# Patient Record
Sex: Female | Born: 1985 | Race: White | Hispanic: No | Marital: Married | Smoking: Former smoker
Health system: Southern US, Community
[De-identification: ages and names within clinical notes are randomized; demographics above are authoritative.]

## PROBLEM LIST (undated history)

## (undated) DIAGNOSIS — G43909 Migraine, unspecified, not intractable, without status migrainosus: Secondary | ICD-10-CM

## (undated) DIAGNOSIS — N912 Amenorrhea, unspecified: Secondary | ICD-10-CM

## (undated) HISTORY — DX: Migraine, unspecified, not intractable, without status migrainosus: G43.909

## (undated) HISTORY — PX: DILATION AND CURETTAGE OF UTERUS: SHX78

## (undated) HISTORY — DX: Amenorrhea, unspecified: N91.2

---

## 2006-02-19 ENCOUNTER — Other Ambulatory Visit: Payer: Self-pay

## 2006-02-19 ENCOUNTER — Emergency Department: Payer: Self-pay | Admitting: Emergency Medicine

## 2006-04-10 ENCOUNTER — Other Ambulatory Visit: Payer: Self-pay

## 2006-04-10 ENCOUNTER — Emergency Department: Payer: Self-pay | Admitting: Unknown Physician Specialty

## 2006-11-30 ENCOUNTER — Emergency Department: Payer: Self-pay | Admitting: Emergency Medicine

## 2007-04-01 ENCOUNTER — Emergency Department: Payer: Self-pay | Admitting: Emergency Medicine

## 2007-04-07 ENCOUNTER — Emergency Department: Payer: Self-pay | Admitting: Emergency Medicine

## 2007-06-05 ENCOUNTER — Emergency Department: Payer: Self-pay | Admitting: Internal Medicine

## 2007-08-17 ENCOUNTER — Encounter: Payer: Self-pay | Admitting: Family Medicine

## 2007-08-23 ENCOUNTER — Encounter: Payer: Self-pay | Admitting: Family Medicine

## 2007-11-23 ENCOUNTER — Observation Stay: Payer: Self-pay | Admitting: Obstetrics and Gynecology

## 2007-12-08 ENCOUNTER — Observation Stay: Payer: Self-pay | Admitting: Obstetrics & Gynecology

## 2007-12-09 ENCOUNTER — Inpatient Hospital Stay: Payer: Self-pay

## 2010-02-15 ENCOUNTER — Inpatient Hospital Stay: Payer: Self-pay | Admitting: Unknown Physician Specialty

## 2011-11-02 ENCOUNTER — Emergency Department: Payer: Self-pay

## 2014-08-01 ENCOUNTER — Observation Stay: Payer: Self-pay | Admitting: Obstetrics and Gynecology

## 2014-08-01 LAB — URINALYSIS, COMPLETE
BLOOD: NEGATIVE
Bilirubin,UR: NEGATIVE
GLUCOSE, UR: NEGATIVE mg/dL (ref 0–75)
Ketone: NEGATIVE
Nitrite: NEGATIVE
Ph: 6 (ref 4.5–8.0)
Protein: 30
RBC,UR: 3 /HPF (ref 0–5)
Specific Gravity: 1.017 (ref 1.003–1.030)
WBC UR: 7 /HPF (ref 0–5)

## 2014-08-01 LAB — PIH PROFILE
Anion Gap: 8 (ref 7–16)
BUN: 5 mg/dL — ABNORMAL LOW (ref 7–18)
CALCIUM: 8.5 mg/dL (ref 8.5–10.1)
CO2: 24 mmol/L (ref 21–32)
CREATININE: 0.54 mg/dL — AB (ref 0.60–1.30)
Chloride: 107 mmol/L (ref 98–107)
EGFR (African American): >60
EGFR (Non-African Amer.): >60
Glucose: 75 mg/dL (ref 65–99)
HCT: 33 % — ABNORMAL LOW (ref 35.0–47.0)
HGB: 10.5 g/dL — ABNORMAL LOW (ref 12.0–16.0)
MCH: 27.7 pg (ref 26.0–34.0)
MCHC: 31.8 g/dL — ABNORMAL LOW (ref 32.0–36.0)
MCV: 87 fL (ref 80–100)
Osmolality: 273 (ref 275–301)
PLATELETS: 193 10*3/uL (ref 150–440)
Potassium: 3.9 mmol/L (ref 3.5–5.1)
RBC: 3.8 10*6/uL (ref 3.80–5.20)
RDW: 15.2 % — ABNORMAL HIGH (ref 11.5–14.5)
SGOT(AST): 22 U/L (ref 15–37)
SODIUM: 139 mmol/L (ref 136–145)
Uric Acid: 2.8 mg/dL (ref 2.6–6.0)
WBC: 11.7 10*3/uL — ABNORMAL HIGH (ref 3.6–11.0)

## 2014-08-01 LAB — PROTEIN / CREATININE RATIO, URINE
Creatinine, Urine: 193 mg/dL — ABNORMAL HIGH (ref 30.0–125.0)
PROTEIN, RANDOM URINE: 32 mg/dL — AB (ref 0–12)
Protein/Creat. Ratio: 166 mg/gCREAT (ref 0–200)

## 2014-08-20 ENCOUNTER — Inpatient Hospital Stay: Payer: Self-pay | Admitting: Obstetrics and Gynecology

## 2014-08-20 LAB — CBC WITH DIFFERENTIAL/PLATELET
Basophil #: 0 10*3/uL (ref 0.0–0.1)
Basophil %: 0.4 %
EOS ABS: 0.1 10*3/uL (ref 0.0–0.7)
EOS PCT: 0.9 %
HCT: 36.9 % (ref 35.0–47.0)
HGB: 12.1 g/dL (ref 12.0–16.0)
LYMPHS ABS: 2.8 10*3/uL (ref 1.0–3.6)
Lymphocyte %: 30.6 %
MCH: 28.8 pg (ref 26.0–34.0)
MCHC: 32.8 g/dL (ref 32.0–36.0)
MCV: 88 fL (ref 80–100)
MONO ABS: 0.5 x10 3/mm (ref 0.2–0.9)
Monocyte %: 6 %
NEUTROS ABS: 5.6 10*3/uL (ref 1.4–6.5)
Neutrophil %: 62.1 %
PLATELETS: 211 10*3/uL (ref 150–440)
RBC: 4.2 10*6/uL (ref 3.80–5.20)
RDW: 18 % — ABNORMAL HIGH (ref 11.5–14.5)
WBC: 9 10*3/uL (ref 3.6–11.0)

## 2014-08-22 LAB — HEMATOCRIT: HCT: 30.7 % — AB (ref 35.0–47.0)

## 2014-09-19 ENCOUNTER — Ambulatory Visit: Payer: Self-pay | Admitting: Obstetrics and Gynecology

## 2014-11-26 ENCOUNTER — Ambulatory Visit: Payer: Self-pay | Admitting: Podiatry

## 2014-12-15 LAB — SURGICAL PATHOLOGY

## 2014-12-21 NOTE — Op Note (Signed)
PATIENT NAME:  Madison Randall, Madison Randall MR#:  [id number] DATE OF BIRTH:  [birth year][name]/1987  DATE OF PROCEDURE:  09/19/2014  PREOPERATIVE DIAGNOSES:  1. Heavy vaginal bleeding, refractory to outpatient management.  2. One month postpartum.   POSTOPERATIVE DIAGNOSES:  1. Heavy vaginal bleeding, refractory to outpatient management.  2. One month postpartum.   PROCEDURES:  1. Dilation curettage.  2. Hysteroscopy.   ANESTHESIA: General.   SURGEON: Conard NovakStephen D. Brenten Janney, MD   ESTIMATED BLOOD LOSS: 150 mL   OPERATIVE FLUIDS: 700 mL crystalloid.   COMPLICATIONS: None.   FINDINGS:  Several areas both anteriorly and posteriorly with tissue fragments of indeterminate type.   SPECIMENS: Endometrial curettings.   CONDITION AT END OF PROCEDURE: Stable.   PROCEDURE IN DETAIL: The patient was sent to the operating room, where general anesthesia was administered and found to be adequate. The patient was placed in dorsal supine high lithotomy position in candy cane stirrups and prepped and draped in the usual sterile fashion. After a timeout was called, an in-and-out catheterization was performed for return of approximately 100 mL of clear urine. A sterile speculum was placed in the vagina and a single-tooth tenaculum the was affixed to the anterior lip of the cervix. The cervix was sounded to a depth of approximately 9 cm and it was noted that the uterus was in a retroverted position. The cervix was dilated gently in a serial fashion using Hegar dilators to 8 mm. The hysteroscope was passed gently through the cervix into the uterine cavity with the above-noted findings. The hysteroscope was removed and a gentle curettage was performed until a gritty texture was noted throughout the entire uterus. The hysteroscope was reintroduced into the uterus with no noted defects in the uterus and no heavy bleeding. The hysteroscope was removed and the cervix was watched for approximately 10 minutes to ensure that no heavy  bleeding would occur, and there was no bleeding from the cervix at the end of the procedure. The single-tooth tenaculum was removed and silver nitrate was applied to the entry sites for hemostasis. All instrumentation was verified to be free of the vagina at this point.   The patient tolerated the procedure well. Sponge, lap, and needle counts were correct x2. For VTE prophylaxis, the patient was wearing SCDs. For antibiotic prophylaxis, the patient received Ancef 1 gram IV, within 1 hour of the beginning of the case. The patient was awakened in the operating room and taken to the recovery area in stable condition.    ____________________________ Conard NovakStephen D. Tamma Brigandi, MD sdj:ap D: 09/19/2014 11:42:56 ET Randall: 09/19/2014 14:31:53 ET JOB#: [id number]  cc: Conard NovakStephen D. Brigett Estell, MD, <Dictator> Conard NovakSTEPHEN D Cacie Gaskins MD ELECTRONICALLY SIGNED 09/24/2014 0:31

## 2014-12-30 NOTE — H&P (Signed)
L&D Evaluation:  History:  HPI 29 yo G3P2002 at 5018w2d by D=8wk US derived EDC of 08/13/2014 presenting for elevation of elevated diastolic BP.  Initial BP 136/70, 1 week prior 136/70.  No headaches but repeat BP taken and 134/100 amd 136/90.  Weigh also stable from last week. +FM, no LOF, no VB, no ctx.  Denies HA, vision changes, RUQ or epigastric pain, increased edema.  [facility] for centering pregnancy participation and otherwise uncomplicated.   Presents with elevated diastolic BP reading in clinic   Patient's Medical History depression, migraines   Exam:  Vital Signs 98-135/59-80   Urine Protein trace   General no apparent distress   Mental Status clear   Chest clear   Abdomen gravid, non-tender   Estimated Fetal Weight Average for gestational age   Edema no edema   FHT normal rate with no decels, category I 135, moderate, + accels, nodecels   Ucx absent   Impression:  Impression 29 yo G3P2002 at 6718w2d with elevated diastolic BP reading noted in clinic normotensive here on L&D   Plan:  Comments 1) R/O preeclampsia/GHTN - normotensive, normal labs.  Routine precautions  2) Fetus - category I tracing - 20lbs weight gain this pregnancy  3) A pos / RI / VZI / HBsAg neg / HIV neg / RPR NR / 1-hr 96 / GBS negative  4) Influenza vaccination up to date (06/13/14) declined TDAP 2/2 prior allergic reactions  5) Breast / minipill  6) Disposition - d/c home follow up in place 08/06/14   Electronic Signatures: Lorrene ReidStaebler, Michele Judy M (MD)  (Signed 11-Dec-15 12:27)  Authored: L&D Evaluation   Last Updated: 11-Dec-15 12:27 by Lorrene ReidStaebler, Jaala Bohle M (MD)

## 2015-03-02 ENCOUNTER — Emergency Department: Payer: Self-pay

## 2015-03-02 ENCOUNTER — Emergency Department
Admission: EM | Admit: 2015-03-02 | Discharge: 2015-03-02 | Disposition: A | Discharge status: Home / Self Care | Payer: Self-pay | Attending: Emergency Medicine | Admitting: Emergency Medicine

## 2015-03-02 ENCOUNTER — Encounter: Payer: Self-pay | Admitting: Emergency Medicine

## 2015-03-02 DIAGNOSIS — Z79899 Other long term (current) drug therapy: Secondary | ICD-10-CM | POA: Insufficient documentation

## 2015-03-02 DIAGNOSIS — M542 Cervicalgia: Secondary | ICD-10-CM

## 2015-03-02 DIAGNOSIS — W51XXXA Accidental striking against or bumped into by another person, initial encounter: Secondary | ICD-10-CM | POA: Insufficient documentation

## 2015-03-02 DIAGNOSIS — Y998 Other external cause status: Secondary | ICD-10-CM | POA: Insufficient documentation

## 2015-03-02 DIAGNOSIS — Y9389 Activity, other specified: Secondary | ICD-10-CM | POA: Insufficient documentation

## 2015-03-02 DIAGNOSIS — S199XXA Unspecified injury of neck, initial encounter: Secondary | ICD-10-CM | POA: Insufficient documentation

## 2015-03-02 DIAGNOSIS — S63601A Unspecified sprain of right thumb, initial encounter: Secondary | ICD-10-CM | POA: Insufficient documentation

## 2015-03-02 DIAGNOSIS — Y9289 Other specified places as the place of occurrence of the external cause: Secondary | ICD-10-CM | POA: Insufficient documentation

## 2015-03-02 MED ORDER — CYCLOBENZAPRINE HCL 10 MG PO TABS: 10.0000 mg | ORAL_TABLET | Freq: Three times a day (TID) | ORAL | Status: AC | PRN

## 2015-03-02 MED ORDER — CYCLOBENZAPRINE HCL 10 MG PO TABS
5.0000 mg | ORAL_TABLET | Freq: Once | ORAL | Status: AC
Start: 2015-03-02 — End: 2015-03-02
  Administered 2015-03-02: 5 mg via ORAL

## 2015-03-02 MED ORDER — IBUPROFEN 800 MG PO TABS
ORAL_TABLET | ORAL | Status: AC
  Administered 2015-03-02: 800 mg via ORAL
  Filled 2015-03-02: qty 1

## 2015-03-02 MED ORDER — IBUPROFEN 800 MG PO TABS
800.0000 mg | ORAL_TABLET | Freq: Once | ORAL | Status: AC
  Administered 2015-03-02: 800 mg via ORAL

## 2015-03-02 MED ORDER — CYCLOBENZAPRINE HCL 10 MG PO TABS
ORAL_TABLET | ORAL | Status: AC
  Filled 2015-03-02: qty 1

## 2015-03-02 NOTE — ED Notes (Addendum)
Pt says she was  Playing with her kids when she injured her neck and right thumb; unable to turn head side to side due to  Pain in the side of her neck; swelling to base of right thumb-pain increased with movement; pt tried icy hot at home with no relief; pt with point tenderness to right side of her neck;

## 2015-03-02 NOTE — Discharge Instructions (Signed)
Finger Sprain A finger sprain is a tear in one of the strong, fibrous tissues that connect the bones (ligaments) in your finger. The severity of the sprain depends on how much of the ligament is torn. The tear can be either partial or complete. CAUSES  Often, sprains are a result of a fall or accident. If you extend your hands to catch an object or to protect yourself, the force of the impact causes the fibers of your ligament to stretch too much. This excess tension causes the fibers of your ligament to tear. SYMPTOMS  You may have some loss of motion in your finger. Other symptoms include:  Bruising.  Tenderness.  Swelling. DIAGNOSIS  In order to diagnose finger sprain, your caregiver will physically examine your finger or thumb to determine how torn the ligament is. Your caregiver may also suggest an X-ray exam of your finger to make sure no bones are broken. TREATMENT  If your ligament is only partially torn, treatment usually involves keeping the finger in a fixed position (immobilization) for a short period. To do this, your caregiver will apply a bandage, cast, or splint to keep your finger from moving until it heals. For a partially torn ligament, the healing process usually takes 2 to 3 weeks. If your ligament is completely torn, you may need surgery to reconnect the ligament to the bone. After surgery a cast or splint will be applied and will need to stay on your finger or thumb for 4 to 6 weeks while your ligament heals. HOME CARE INSTRUCTIONS  Keep your injured finger elevated, when possible, to decrease swelling.  To ease pain and swelling, apply ice to your joint twice a day, for 2 to 3 days:  Put ice in a plastic bag.  Place a towel between your skin and the bag.  Leave the ice on for 15 minutes.  Only take over-the-counter or prescription medicine for pain as directed by your caregiver.  Do not wear rings on your injured finger.  Do not leave your finger unprotected  until pain and stiffness go away (usually 3 to 4 weeks).  Do not allow your cast or splint to get wet. Cover your cast or splint with a plastic bag when you shower or bathe. Do not swim.  Your caregiver may suggest special exercises for you to do during your recovery to prevent or limit permanent stiffness. SEEK IMMEDIATE MEDICAL CARE IF:  Your cast or splint becomes damaged.  Your pain becomes worse rather than better. MAKE SURE YOU:  Understand these instructions.  Will watch your condition.  Will get help right away if you are not doing well or get worse. Document Released: 09/15/2004 Document Revised: 10/31/2011 Document Reviewed: 04/11/2011 [facility] Patient Information 2015 [place], [place]. This information is not intended to replace advice given to you by your health care provider. Make sure you discuss any questions you have with your health care provider.  Muscle Strain A muscle strain is an injury that occurs when a muscle is stretched beyond its normal length. Usually a small number of muscle fibers are torn when this happens. Muscle strain is rated in degrees. First-degree strains have the least amount of muscle fiber tearing and pain. Second-degree and third-degree strains have increasingly more tearing and pain.  Usually, recovery from muscle strain takes 1-2 weeks. Complete healing takes 5-6 weeks.  CAUSES  Muscle strain happens when a sudden, violent force placed on a muscle stretches it too far. This may occur with lifting,  sports, or a fall.  RISK FACTORS Muscle strain is especially common in athletes.  SIGNS AND SYMPTOMS At the site of the muscle strain, there may be:  Pain.  Bruising.  Swelling.  Difficulty using the muscle due to pain or lack of normal function. DIAGNOSIS  Your health care provider will perform a physical exam and ask about your medical history. TREATMENT  Often, the best treatment for a muscle strain is resting, icing, and applying cold  compresses to the injured area.  HOME CARE INSTRUCTIONS   Use the PRICE method of treatment to promote muscle healing during the first 2-3 days after your injury. The PRICE method involves:  Protecting the muscle from being injured again.  Restricting your activity and resting the injured body part.  Icing your injury. To do this, put ice in a plastic bag. Place a towel between your skin and the bag. Then, apply the ice and leave it on from 15-20 minutes each hour. After the third day, switch to moist heat packs.  Apply compression to the injured area with a splint or elastic bandage. Be careful not to wrap it too tightly. This may interfere with blood circulation or increase swelling.  Elevate the injured body part above the level of your heart as often as you can.  Only take over-the-counter or prescription medicines for pain, discomfort, or fever as directed by your health care provider.  Warming up prior to exercise helps to prevent future muscle strains. SEEK MEDICAL CARE IF:   You have increasing pain or swelling in the injured area.  You have numbness, tingling, or a significant loss of strength in the injured area. MAKE SURE YOU:   Understand these instructions.  Will watch your condition.  Will get help right away if you are not doing well or get worse. Document Released: 08/08/2005 Document Revised: 05/29/2013 Document Reviewed: 03/07/2013 [facility] Patient Information 2015 [place], [place]. This information is not intended to replace advice given to you by your health care provider. Make sure you discuss any questions you have with your health care provider.  Cervical Sprain A cervical sprain is an injury in the neck in which the strong, fibrous tissues (ligaments) that connect your neck bones stretch or tear. Cervical sprains can range from mild to severe. Severe cervical sprains can cause the neck vertebrae to be unstable. This can lead to damage of the spinal cord and  can result in serious nervous system problems. The amount of time it takes for a cervical sprain to get better depends on the cause and extent of the injury. Most cervical sprains heal in 1 to 3 weeks. CAUSES  Severe cervical sprains may be caused by:   Contact sport injuries (such as from football, rugby, wrestling, hockey, auto racing, gymnastics, diving, martial arts, or boxing).   Motor vehicle collisions.   Whiplash injuries. This is an injury from a sudden forward and backward whipping movement of the head and neck.  Falls.  Mild cervical sprains may be caused by:   Being in an awkward position, such as while cradling a telephone between your ear and shoulder.   Sitting in a chair that does not offer proper support.   Working at a poorly Marketing executive station.   Looking up or down for long periods of time.  SYMPTOMS   Pain, soreness, stiffness, or a burning sensation in the front, back, or sides of the neck. This discomfort may develop immediately after the injury or slowly, 24 hours or  more after the injury.   Pain or tenderness directly in the middle of the back of the neck.   Shoulder or upper back pain.   Limited ability to move the neck.   Headache.   Dizziness.   Weakness, numbness, or tingling in the hands or arms.   Muscle spasms.   Difficulty swallowing or chewing.   Tenderness and swelling of the neck.  DIAGNOSIS  Most of the time your health care provider can diagnose a cervical sprain by taking your history and doing a physical exam. Your health care provider will ask about previous neck injuries and any known neck problems, such as arthritis in the neck. X-rays may be taken to find out if there are any other problems, such as with the bones of the neck. Other tests, such as a CT scan or MRI, may also be needed.  TREATMENT  Treatment depends on the severity of the cervical sprain. Mild sprains can be treated with rest, keeping the  neck in place (immobilization), and pain medicines. Severe cervical sprains are immediately immobilized. Further treatment is done to help with pain, muscle spasms, and other symptoms and may include:  Medicines, such as pain relievers, numbing medicines, or muscle relaxants.   Physical therapy. This may involve stretching exercises, strengthening exercises, and posture training. Exercises and improved posture can help stabilize the neck, strengthen muscles, and help stop symptoms from returning.  HOME CARE INSTRUCTIONS   Put ice on the injured area.   Put ice in a plastic bag.   Place a towel between your skin and the bag.   Leave the ice on for 15-20 minutes, 3-4 times a day.   If your injury was severe, you may have been given a cervical collar to wear. A cervical collar is a two-piece collar designed to keep your neck from moving while it heals.  Do not remove the collar unless instructed by your health care provider.  If you have long hair, keep it outside of the collar.  Ask your health care provider before making any adjustments to your collar. Minor adjustments may be required over time to improve comfort and reduce pressure on your chin or on the back of your head.  Ifyou are allowed to remove the collar for cleaning or bathing, follow your health care provider's instructions on how to do so safely.  Keep your collar clean by wiping it with mild soap and water and drying it completely. If the collar you have been given includes removable pads, remove them every 1-2 days and hand wash them with soap and water. Allow them to air dry. They should be completely dry before you wear them in the collar.  If you are allowed to remove the collar for cleaning and bathing, wash and dry the skin of your neck. Check your skin for irritation or sores. If you see any, tell your health care provider.  Do not drive while wearing the collar.   Only take over-the-counter or prescription  medicines for pain, discomfort, or fever as directed by your health care provider.   Keep all follow-up appointments as directed by your health care provider.   Keep all physical therapy appointments as directed by your health care provider.   Make any needed adjustments to your workstation to promote good posture.   Avoid positions and activities that make your symptoms worse.   Warm up and stretch before being active to help prevent problems.  SEEK MEDICAL CARE IF:   Your  pain is not controlled with medicine.   You are unable to decrease your pain medicine over time as planned.   Your activity level is not improving as expected.  SEEK IMMEDIATE MEDICAL CARE IF:   You develop any bleeding.  You develop stomach upset.  You have signs of an allergic reaction to your medicine.   Your symptoms get worse.   You develop new, unexplained symptoms.   You have numbness, tingling, weakness, or paralysis in any part of your body.  MAKE SURE YOU:   Understand these instructions.  Will watch your condition.  Will get help right away if you are not doing well or get worse. Document Released: 06/05/2007 Document Revised: 08/13/2013 Document Reviewed: 02/13/2013 [facility] Patient Information 2015 [place], [place]. This information is not intended to replace advice given to you by your health care provider. Make sure you discuss any questions you have with your health care provider.

## 2015-03-02 NOTE — ED Provider Notes (Signed)
[facility] Emergency Department Provider Note  ____________________________________________  Time seen: Approximately 6:19 AM  I have reviewed the triage vital signs and the nursing notes.   HISTORY  Chief Complaint Neck Pain and Finger Injury   HPI Madison Randall is a 29 y.o. female reports that she was playing with her children around 2100. She reports that she was carrying her 6-year-old on her feet when her 81-year-old jumped on and they both fell onto her. The patient reports that she jerked her neck up suddenly and they smashed into her thumb on the right hand. The patient reports that she thought the pain would improve during the night but when she was nursing her baby she felt as though she could not move her neck. She also reports that she is unable to put any pressure on her thumb. The patient reports that she rubbed with some icy hot but did not take any medication. The patient reports that her pain as a 6-7 out of 10 in intensity. She reports that when she initially came in she was unable to move her neck but it has loosened up some.   History reviewed. No pertinent past medical history.  There are no active problems to display for this patient.   Past Surgical History  Procedure Laterality Date  . Dilation and curettage of uterus      Current Outpatient Rx  Name  Route  Sig  Dispense  Refill  . Prenatal Vit-Fe Fumarate-FA (PRENATAL MULTIVITAMIN) TABS tablet   Oral   Take 1 tablet by mouth daily at 12 noon.         . cyclobenzaprine (FLEXERIL) 10 MG tablet   Oral   Take 1 tablet (10 mg total) by mouth every 8 (eight) hours as needed for muscle spasms.   12 tablet   0     Allergies Tdap; Imitrex; and Topamax  History reviewed. No pertinent family history.  Social History History  Substance Use Topics  . Smoking status: Never Smoker   . Smokeless tobacco: Never Used  . Alcohol Use: No    Review of Systems Constitutional:  No fever/chills Eyes: No visual changes. ENT: No sore throat. Cardiovascular: Denies chest pain. Respiratory: Denies shortness of breath. Gastrointestinal: No abdominal pain.  No nausea, no vomiting.  No diarrhea.  No constipation. Genitourinary: Negative for dysuria. Musculoskeletal: Neck pain, right thumb pain Skin: Negative for rash. Neurological: Negative for headaches, focal weakness or numbness.  10-point ROS otherwise negative.  ____________________________________________   PHYSICAL EXAM:  VITAL SIGNS: ED Triage Vitals  Enc Vitals Group     BP 03/02/15 0140 142/83 mmHg     Pulse Rate 03/02/15 0140 101     Resp 03/02/15 0140 18     Temp 03/02/15 0140 98.3 F (36.8 C)     Temp Source 03/02/15 0140 Oral     SpO2 03/02/15 0140 97 %     Weight 03/02/15 0140 180 lb (81.647 kg)     Height 03/02/15 0140  (1.6 m)     Head Cir --      Peak Flow --      Pain Score 03/02/15 0142 8     Pain Loc --      Pain Edu? --      Excl. in GC? --     Constitutional: Alert and oriented. Well appearing and in mild distress. Eyes: Conjunctivae are normal. PERRL. EOMI. Head: Atraumatic. Nose: No congestion/rhinnorhea. Mouth/Throat: Mucous membranes are moist.  Oropharynx non-erythematous. Neck:No cervical spine tenderness to palpation, mild tenderness to palpation of the right side of neck and shoulders Cardiovascular: Normal rate, regular rhythm. Grossly normal heart sounds.  Good peripheral circulation. Respiratory: Normal respiratory effort.  No retractions. Lungs CTAB. Gastrointestinal: Soft and nontender. No distention. Positive bowel sounds Genitourinary: Deferred Musculoskeletal: No lower extremity tenderness nor edema. Neurologic:  Normal speech and language. No gross focal neurologic deficits are appreciated.  Skin:  Skin is warm, dry and intact. No rash noted. Psychiatric: Mood and affect are normal.   ____________________________________________   LABS (all labs  ordered are listed, but only abnormal results are displayed)  Labs Reviewed - No data to display ____________________________________________  EKG  None ____________________________________________  RADIOLOGY  Right thumb x-ray: Negative ____________________________________________   PROCEDURES  Procedure(s) performed: None  Critical Care performed: No  ____________________________________________   INITIAL IMPRESSION / ASSESSMENT AND PLAN / ED COURSE  Pertinent labs & imaging results that were available during my care of the patient were reviewed by me and considered in my medical decision making (see chart for details).  This is a 29 year old female who comes in today with some neck pain and right thumb pain after playing with her children and stretching her neck. The patient did receive some ibuprofen in triage and she reports that her neck feels less stiff and painful. The patient reports that her thumb is painful. Her x-ray does not show any acute fracture at this time. I will place the patient in an aluminum foam finger splint and discharge her with some Flexeril for muscle relaxation. I also informed her that she should place some heat on her neck to help the muscles relax. The patient be discharged home to follow-up with the acute care clinic. ____________________________________________   FINAL CLINICAL IMPRESSION(S) / ED DIAGNOSES  Final diagnoses:  Cervicalgia  Thumb sprain, right, initial encounter      Rebecka ApleyAllison P Webster, MD 03/02/15 [phone]

## 2015-12-18 ENCOUNTER — Telehealth: Payer: Self-pay | Admitting: General Practice

## 2015-12-18 NOTE — Telephone Encounter (Signed)
Left msg to call office to schedule new patient visit/msn

## 2016-04-04 ENCOUNTER — Ambulatory Visit (INDEPENDENT_AMBULATORY_CARE_PROVIDER_SITE_OTHER): Payer: Self-pay | Admitting: Obstetrics and Gynecology

## 2016-04-04 VITALS — BP 120/70 | HR 80 | Wt 156.6 lb

## 2016-04-04 DIAGNOSIS — Z369 Encounter for antenatal screening, unspecified: Secondary | ICD-10-CM

## 2016-04-04 DIAGNOSIS — Z3490 Encounter for supervision of normal pregnancy, unspecified, unspecified trimester: Secondary | ICD-10-CM

## 2016-04-04 DIAGNOSIS — G43909 Migraine, unspecified, not intractable, without status migrainosus: Secondary | ICD-10-CM | POA: Insufficient documentation

## 2016-04-04 DIAGNOSIS — O09291 Supervision of pregnancy with other poor reproductive or obstetric history, first trimester: Secondary | ICD-10-CM

## 2016-04-04 DIAGNOSIS — Z36 Encounter for antenatal screening of mother: Secondary | ICD-10-CM

## 2016-04-04 DIAGNOSIS — Z1389 Encounter for screening for other disorder: Secondary | ICD-10-CM

## 2016-04-04 DIAGNOSIS — G43009 Migraine without aura, not intractable, without status migrainosus: Secondary | ICD-10-CM

## 2016-04-04 DIAGNOSIS — Z331 Pregnant state, incidental: Secondary | ICD-10-CM

## 2016-04-04 DIAGNOSIS — Z113 Encounter for screening for infections with a predominantly sexual mode of transmission: Secondary | ICD-10-CM

## 2016-04-04 DIAGNOSIS — T7589XA Other specified effects of external causes, initial encounter: Secondary | ICD-10-CM

## 2016-04-04 NOTE — Patient Instructions (Signed)
Pregnancy and Zika Virus Disease Zika virus disease, or Zika, is an illness that can spread to people from mosquitoes that carry the virus. It may also spread from person to person through infected body fluids. Zika first occurred in [place], but recently it has spread to new areas. The virus occurs in tropical climates. The location of Zika continues to change. Most people who become infected with Zika virus do not develop serious illness. However, Zika may cause birth defects in an unborn baby whose mother is infected with the virus. It may also increase the risk of miscarriage. WHAT ARE THE SYMPTOMS OF ZIKA VIRUS DISEASE? In many cases, people who have been infected with Zika virus do not develop any symptoms. If symptoms appear, they usually start about a week after the person is infected. Symptoms are usually mild. They may include:  Fever.  Rash.  Red eyes.  Joint pain. HOW DOES ZIKA VIRUS DISEASE SPREAD? The main way that Zika virus spreads is through the bite of a certain type of mosquito. Unlike most types of mosquitos, which bite only at night, the type of mosquito that carries Zika virus bites both at night and during the day. Zika virus can also spread through sexual contact, through a blood transfusion, and from a mother to her baby before or during birth. Once you have had Zika virus disease, it is unlikely that you will get it again. CAN I PASS ZIKA TO MY BABY DURING PREGNANCY? Yes, Zika can pass from a mother to her baby before or during birth. WHAT PROBLEMS CAN ZIKA CAUSE FOR MY BABY? A woman who is infected with Zika virus while pregnant is at risk of having her baby born with a condition in which the brain or head is smaller than expected (microcephaly). Babies who have microcephaly can have developmental delays, seizures, hearing problems, and vision problems. Having Zika virus disease during pregnancy can also increase the risk of miscarriage. HOW CAN ZIKA VIRUS DISEASE BE  PREVENTED? There is no vaccine to prevent Zika. The best way to prevent the disease is to avoid infected mosquitoes and avoid exposure to body fluids that can spread the virus. Avoid any possible exposure to Zika by taking the following precautions. For women and their sex partners:  Avoid traveling to high-risk areas. The locations where Zika is being reported change often. To identify high-risk areas, check the CDC travel website: www.cdc.gov/zika/geo/index.html  If you or your sex partner must travel to a high-risk area, talk with a health care provider before and after traveling.  Take all precautions to avoid mosquito bites if you live in, or travel to, any of the high-risk areas. Insect repellents are safe to use during pregnancy.  Ask your health care provider when it is safe to have sexual contact. For women:  If you are pregnant or trying to become pregnant, avoid sexual contact with persons who may have been exposed to Zika virus, persons who have possible symptoms of Zika, or persons whose history you are unsure about. If you choose to have sexual contact with someone who may have been exposed to Zika virus, use condoms correctly during the entire duration of sexual activity, every time. Do not share sexual devices, as you may be exposed to body fluids.  Ask your health care provider about when it is safe to attempt pregnancy after a possible exposure to Zika virus. WHAT STEPS SHOULD I TAKE TO AVOID MOSQUITO BITES? Take these steps to avoid mosquito bites when you are   in a high-risk area:  Wear loose clothing that covers your arms and legs.  Limit your outdoor activities.  Do not open windows unless they have window screens.  Sleep under mosquito nets.  Use insect repellent. The best insect repellents have:  DEET, picaridin, oil of lemon eucalyptus (OLE), or IR3535 in them.  Higher amounts of an active ingredient in them.  Remember that insect repellents are safe to use  during pregnancy.  Do not use OLE on children who are younger than 3 years of age. Do not use insect repellent on babies who are younger than 2 months of age.  Cover your child's stroller with mosquito netting. Make sure the netting fits snugly and that any loose netting does not cover your child's mouth or nose. Do not use a blanket as a mosquito-protection cover.  Do not apply insect repellent underneath clothing.  If you are using sunscreen, apply the sunscreen before applying the insect repellent.  Treat clothing with permethrin. Do not apply permethrin directly to your skin. Follow label directions for safe use.  Get rid of standing water, where mosquitoes may reproduce. Standing water is often found in items such as buckets, bowls, animal food dishes, and flowerpots. When you return from traveling to any high-risk area, continue taking actions to protect yourself against mosquito bites for 3 weeks, even if you show no signs of illness. This will prevent spreading Zika virus to uninfected mosquitoes. WHAT SHOULD I KNOW ABOUT THE SEXUAL TRANSMISSION OF ZIKA? People can spread Zika to their sexual partners during vaginal, anal, or oral sex, or by sharing sexual devices. Many people with Zika do not develop symptoms, so a person could spread the disease without knowing that they are infected. The greatest risk is to women who are pregnant or who may become pregnant. Zika virus can live longer in semen than it can live in blood. Couples can prevent sexual transmission of the virus by:  Using condoms correctly during the entire duration of sexual activity, every time. This includes vaginal, anal, and oral sex.  Not sharing sexual devices. Sharing increases your risk of being exposed to body fluid from another person.  Avoiding all sexual activity until your health care provider says it is safe. SHOULD I BE TESTED FOR ZIKA VIRUS? A sample of your blood can be tested for Zika virus. A pregnant  woman should be tested if she may have been exposed to the virus or if she has symptoms of Zika. She may also have additional tests done during her pregnancy, such ultrasound testing. Talk with your health care provider about which tests are recommended.   This information is not intended to replace advice given to you by your health care provider. Make sure you discuss any questions you have with your health care provider.   Document Released: 04/29/2015 Document Reviewed: 04/22/2015 Elsevier Interactive Patient Education 2016 [employer] Minor Illnesses and Medications in Pregnancy  Cold/Flu:  Sudafed for congestion- Robitussin (plain) for cough- Tylenol for discomfort.  Please follow the directions on the label.  Try not to take any more than needed.  OTC Saline nasal spray and air humidifier or cool-mist  Vaporizer to sooth nasal irritation and to loosen congestion.  It is also important to increase intake of non carbonated fluids, especially if you have a fever.  Constipation:  Colace-2 capsules at bedtime; Metamucil- follow directions on label; Senokot- 1 tablet at bedtime.  Any one of these medications can be used.  It is also   very important to increase fluids and fruits along with regular exercise.  If problem persists please call the office.  Diarrhea:  Kaopectate as directed on the label.  Eat a bland diet and increase fluids.  Avoid highly seasoned foods.  Headache:  Tylenol 1 or 2 tablets every 3-4 hours as needed  Indigestion:  Maalox, Mylanta, Tums or Rolaids- as directed on label.  Also try to eat small meals and avoid fatty, greasy or spicy foods.  Nausea with or without Vomiting:  Nausea in pregnancy is caused by increased levels of hormones in the body which influence the digestive system and cause irritation when stomach acids accumulate.  Symptoms usually subside after 1st trimester of pregnancy.  Try the following: 1. Keep saltines, graham crackers or dry toast by your bed  to eat upon awakening. 2. Don't let your stomach get empty.  Try to eat 5-6 small meals per day instead of 3 large ones. 3. Avoid greasy fatty or highly seasoned foods.  4. Take OTC Unisom 1 tablet at bed time along with OTC Vitamin B6 25-50 mg 3 times per day.    If nausea continues with vomiting and you are unable to keep down food and fluids you may need a prescription medication.  Please notify your provider.   Sore throat:  Chloraseptic spray, throat lozenges and or plain Tylenol.  Vaginal Yeast Infection:  OTC Monistat for 7 days as directed on label.  If symptoms do not resolve within a week notify provider.  If any of the above problems do not subside with recommended treatment please call the office for further assistance.   Do not take Aspirin, Advil, Motrin or Ibuprofen.  * * OTC= Over the counter Hyperemesis Gravidarum Hyperemesis gravidarum is a severe form of nausea and vomiting that happens during pregnancy. Hyperemesis is worse than morning sickness. It may cause you to have nausea or vomiting all day for many days. It may keep you from eating and drinking enough food and liquids. Hyperemesis usually occurs during the first half (the first 20 weeks) of pregnancy. It often goes away once a woman is in her second half of pregnancy. However, sometimes hyperemesis continues through an entire pregnancy.  CAUSES  The cause of this condition is not completely known but is thought to be related to changes in the body's hormones when pregnant. It could be from the high level of the pregnancy hormone or an increase in estrogen in the body.  SIGNS AND SYMPTOMS   Severe nausea and vomiting.  Nausea that does not go away.  Vomiting that does not allow you to keep any food down.  Weight loss and body fluid loss (dehydration).  Having no desire to eat or not liking food you have previously enjoyed. DIAGNOSIS  Your health care provider will do a physical exam and ask you about your  symptoms. He or she may also order blood tests and urine tests to make sure something else is not causing the problem.  TREATMENT  You may only need medicine to control the problem. If medicines do not control the nausea and vomiting, you will be treated in the hospital to prevent dehydration, increased acid in the blood (acidosis), weight loss, and changes in the electrolytes in your body that may harm the unborn baby (fetus). You may need IV fluids.  HOME CARE INSTRUCTIONS   Only take over-the-counter or prescription medicines as directed by your health care provider.  Try eating a couple of dry crackers or   toast in the morning before getting out of bed.  Avoid foods and smells that upset your stomach.  Avoid fatty and spicy foods.  Eat 5-6 small meals a day.  Do not drink when eating meals. Drink between meals.  For snacks, eat high-protein foods, such as cheese.  Eat or suck on things that have ginger in them. Ginger helps nausea.  Avoid food preparation. The smell of food can spoil your appetite.  Avoid iron pills and iron in your multivitamins until after 3-4 months of being pregnant. However, consult with your health care provider before stopping any prescribed iron pills. SEEK MEDICAL CARE IF:   Your abdominal pain increases.  You have a severe headache.  You have vision problems.  You are losing weight. SEEK IMMEDIATE MEDICAL CARE IF:   You are unable to keep fluids down.  You vomit blood.  You have constant nausea and vomiting.  You have excessive weakness.  You have extreme thirst.  You have dizziness or fainting.  You have a fever or persistent symptoms for more than 2-3 days.  You have a fever and your symptoms suddenly get worse. MAKE SURE YOU:   Understand these instructions.  Will watch your condition.  Will get help right away if you are not doing well or get worse.   This information is not intended to replace advice given to you by your  health care provider. Make sure you discuss any questions you have with your health care provider.   Document Released: 08/08/2005 Document Revised: 05/29/2013 Document Reviewed: 03/20/2013 Elsevier Interactive Patient Education 2016 [employer] Commonly Asked Questions During Pregnancy  Cats: A parasite can be excreted in cat feces.  To avoid exposure you need to have another person empty the little box.  If you must empty the litter box you will need to wear gloves.  Wash your hands after handling your cat.  This parasite can also be found in raw or undercooked meat so this should also be avoided.  Colds, Sore Throats, Flu: Please check your medication sheet to see what you can take for symptoms.  If your symptoms are unrelieved by these medications please call the office.  Dental Work: Most any dental work your dentist recommends is permitted.  X-rays should only be taken during the first trimester if absolutely necessary.  Your abdomen should be shielded with a lead apron during all x-rays.  Please notify your provider prior to receiving any x-rays.  Novocaine is fine; gas is not recommended.  If your dentist requires a note from us prior to dental work please call the office and we will provide one for you.  Exercise: Exercise is an important part of staying healthy during your pregnancy.  You may continue most exercises you were accustomed to prior to pregnancy.  Later in your pregnancy you will most likely notice you have difficulty with activities requiring balance like riding a bicycle.  It is important that you listen to your body and avoid activities that put you at a higher risk of falling.  Adequate rest and staying well hydrated are a must!  If you have questions about the safety of specific activities ask your provider.    Exposure to Children with illness: Try to avoid obvious exposure; report any symptoms to us when noted,  If you have chicken pos, red measles or mumps, you should  be immune to these diseases.   Please do not take any vaccines while pregnant unless you have checked with   your OB provider.  Fetal Movement: After 28 weeks we recommend you do "kick counts" twice daily.  Lie or sit down in a calm quiet environment and count your baby movements "kicks".  You should feel your baby at least 10 times per hour.  If you have not felt 10 kicks within the first hour get up, walk around and have something sweet to eat or drink then repeat for an additional hour.  If count remains less than 10 per hour notify your provider.  Fumigating: Follow your pest control agent's advice as to how long to stay out of your home.  Ventilate the area well before re-entering.  Hemorrhoids:   Most over-the-counter preparations can be used during pregnancy.  Check your medication to see what is safe to use.  It is important to use a stool softener or fiber in your diet and to drink lots of liquids.  If hemorrhoids seem to be getting worse please call the office.   Hot Tubs:  Hot tubs Jacuzzis and saunas are not recommended while pregnant.  These increase your internal body temperature and should be avoided.  Intercourse:  Sexual intercourse is safe during pregnancy as long as you are comfortable, unless otherwise advised by your provider.  Spotting may occur after intercourse; report any bright red bleeding that is heavier than spotting.  Labor:  If you know that you are in labor, please go to the hospital.  If you are unsure, please call the office and let us help you decide what to do.  Lifting, straining, etc:  If your job requires heavy lifting or straining please check with your provider for any limitations.  Generally, you should not lift items heavier than that you can lift simply with your hands and arms (no back muscles)  Painting:  Paint fumes do not harm your pregnancy, but may make you ill and should be avoided if possible.  Latex or water based paints have less odor than oils.   Use adequate ventilation while painting.  Permanents & Hair Color:  Chemicals in hair dyes are not recommended as they cause increase hair dryness which can increase hair loss during pregnancy.  " Highlighting" and permanents are allowed.  Dye may be absorbed differently and permanents may not hold as well during pregnancy.  Sunbathing:  Use a sunscreen, as skin burns easily during pregnancy.  Drink plenty of fluids; avoid over heating.  Tanning Beds:  Because their possible side effects are still unknown, tanning beds are not recommended.  Ultrasound Scans:  Routine ultrasounds are performed at approximately 20 weeks.  You will be able to see your baby's general anatomy an if you would like to know the gender this can usually be determined as well.  If it is questionable when you conceived you may also receive an ultrasound early in your pregnancy for dating purposes.  Otherwise ultrasound exams are not routinely performed unless there is a medical necessity.  Although you can request a scan we ask that you pay for it when conducted because insurance does not cover " patient request" scans.  Work: If your pregnancy proceeds without complications you may work until your due date, unless your physician or employer advises otherwise.  Round Ligament Pain/Pelvic Discomfort:  Sharp, shooting pains not associated with bleeding are fairly common, usually occurring in the second trimester of pregnancy.  They tend to be worse when standing up or when you remain standing for long periods of time.  These are the result   of pressure of certain pelvic ligaments called "round ligaments".  Rest, Tylenol and heat seem to be the most effective relief.  As the womb and fetus grow, they rise out of the pelvis and the discomfort improves.  Please notify the office if your pain seems different than that described.  It may represent a more serious condition.   

## 2016-04-04 NOTE — Progress Notes (Signed)
Madison GoingKendra T Ercole presents for NOB nurse interview visit. G-5.  [id number]. Pregnancy confirmation at [facility] on 03/24/2016. LMP: 01/26/2016. Had SAB in April 2017, first trimester. Ultrasound ordered for viability and dating due to recent miscarriage. Pregnancy education material explained and given. Has cat in the home. Has changed cat litter before she knew she was pregnant. NOB labs ordered.  HIV labs and Drug screen were explained optional and she could opt out of tests but did not decline. Drug screen ordered. PNV encouraged. Pt declines genetic testing. Pt. To follow up with provider in 2 weeks for NOB physical.  All questions answered.

## 2016-04-06 ENCOUNTER — Other Ambulatory Visit: Payer: Self-pay | Admitting: Obstetrics and Gynecology

## 2016-04-06 ENCOUNTER — Ambulatory Visit (INDEPENDENT_AMBULATORY_CARE_PROVIDER_SITE_OTHER): Payer: 59

## 2016-04-06 DIAGNOSIS — O09291 Supervision of pregnancy with other poor reproductive or obstetric history, first trimester: Secondary | ICD-10-CM

## 2016-04-06 DIAGNOSIS — Z36 Encounter for antenatal screening of mother: Secondary | ICD-10-CM | POA: Diagnosis not present

## 2016-04-06 DIAGNOSIS — Z331 Pregnant state, incidental: Secondary | ICD-10-CM

## 2016-04-06 DIAGNOSIS — Z369 Encounter for antenatal screening, unspecified: Secondary | ICD-10-CM

## 2016-04-06 DIAGNOSIS — Z3490 Encounter for supervision of normal pregnancy, unspecified, unspecified trimester: Secondary | ICD-10-CM

## 2016-04-06 LAB — MONITOR DRUG PROFILE 14(MW)
Amphetamine Scrn, Ur: NEGATIVE ng/mL
BARBITURATE SCREEN URINE: NEGATIVE ng/mL
BENZODIAZEPINE SCREEN, URINE: NEGATIVE ng/mL
Buprenorphine, Urine: NEGATIVE ng/mL
CANNABINOIDS UR QL SCN: NEGATIVE ng/mL
CREATININE(CRT), U: 228 mg/dL (ref 20.0–300.0)
Cocaine (Metab) Scrn, Ur: NEGATIVE ng/mL
FENTANYL, URINE: NEGATIVE pg/mL
METHADONE SCREEN, URINE: NEGATIVE ng/mL
Meperidine Screen, Urine: NEGATIVE ng/mL
OPIATE SCREEN URINE: NEGATIVE ng/mL
OXYCODONE+OXYMORPHONE UR QL SCN: NEGATIVE ng/mL
Ph of Urine: 5.8 (ref 4.5–8.9)
Phencyclidine Qn, Ur: NEGATIVE ng/mL
Propoxyphene Scrn, Ur: NEGATIVE ng/mL
SPECIFIC GRAVITY: 1.022
TRAMADOL SCREEN, URINE: NEGATIVE ng/mL

## 2016-04-06 LAB — CBC WITH DIFFERENTIAL/PLATELET
BASOS ABS: 0 10*3/uL (ref 0.0–0.2)
Basos: 0 %
EOS (ABSOLUTE): 0.1 10*3/uL (ref 0.0–0.4)
Eos: 1 %
HEMATOCRIT: 42 % (ref 34.0–46.6)
HEMOGLOBIN: 13.8 g/dL (ref 11.1–15.9)
Immature Grans (Abs): 0 10*3/uL (ref 0.0–0.1)
Immature Granulocytes: 0 %
LYMPHS: 22 %
Lymphocytes Absolute: 1.6 10*3/uL (ref 0.7–3.1)
MCH: 29.6 pg (ref 26.6–33.0)
MCHC: 32.9 g/dL (ref 31.5–35.7)
MCV: 90 fL (ref 79–97)
MONOCYTES: 6 %
Monocytes Absolute: 0.4 10*3/uL (ref 0.1–0.9)
NEUTROS PCT: 71 %
Neutrophils Absolute: 4.9 10*3/uL (ref 1.4–7.0)
Platelets: 273 10*3/uL (ref 150–379)
RBC: 4.67 x10E6/uL (ref 3.77–5.28)
RDW: 13.5 % (ref 12.3–15.4)
WBC: 7 10*3/uL (ref 3.4–10.8)

## 2016-04-06 LAB — MICROSCOPIC EXAMINATION
CASTS: NONE SEEN /LPF
Epithelial Cells (non renal): >10 /hpf — AB (ref 0–10)
RBC, UA: NONE SEEN /hpf (ref 0–?)

## 2016-04-06 LAB — URINALYSIS, ROUTINE W REFLEX MICROSCOPIC
BILIRUBIN UA: NEGATIVE
Glucose, UA: NEGATIVE
Ketones, UA: NEGATIVE
NITRITE UA: NEGATIVE
PH UA: 6 (ref 5.0–7.5)
PROTEIN UA: NEGATIVE
RBC UA: NEGATIVE
Specific Gravity, UA: 1.027 (ref 1.005–1.030)
UUROB: 0.2 mg/dL (ref 0.2–1.0)

## 2016-04-06 LAB — GC/CHLAMYDIA PROBE AMP
CHLAMYDIA, DNA PROBE: NEGATIVE
Neisseria gonorrhoeae by PCR: NEGATIVE

## 2016-04-06 LAB — TOXOPLASMA ANTIBODIES- IGG AND  IGM
Toxoplasma Antibody- IgM: <3 AU/mL (ref 0.0–7.9)
Toxoplasma IgG Ratio: <3 IU/mL (ref 0.0–7.1)

## 2016-04-06 LAB — CULTURE, OB URINE

## 2016-04-06 LAB — NICOTINE SCREEN, URINE: Cotinine Ql Scrn, Ur: NEGATIVE ng/mL

## 2016-04-06 LAB — URINE CULTURE, OB REFLEX: ORGANISM ID, BACTERIA: NO GROWTH

## 2016-04-06 LAB — RPR: RPR: NONREACTIVE

## 2016-04-06 LAB — RUBELLA SCREEN: Rubella Antibodies, IGG: 1.8 index (ref >0.99)

## 2016-04-06 LAB — HEPATITIS B SURFACE ANTIGEN: Hepatitis B Surface Ag: NEGATIVE

## 2016-04-06 LAB — ABO

## 2016-04-06 LAB — VARICELLA ZOSTER ANTIBODY, IGG: Varicella zoster IgG: 632 index (ref >165)

## 2016-04-06 LAB — ANTIBODY SCREEN: ANTIBODY SCREEN: NEGATIVE

## 2016-04-06 LAB — HIV ANTIBODY (ROUTINE TESTING W REFLEX): HIV Screen 4th Generation wRfx: NONREACTIVE

## 2016-04-06 LAB — RH TYPE: Rh Factor: POSITIVE

## 2016-04-11 ENCOUNTER — Encounter: Payer: Self-pay | Admitting: Obstetrics and Gynecology

## 2016-04-27 ENCOUNTER — Ambulatory Visit (INDEPENDENT_AMBULATORY_CARE_PROVIDER_SITE_OTHER): Payer: 59 | Admitting: Obstetrics and Gynecology

## 2016-04-27 VITALS — BP 107/69 | HR 89 | Wt 156.4 lb

## 2016-04-27 DIAGNOSIS — Z8669 Personal history of other diseases of the nervous system and sense organs: Secondary | ICD-10-CM

## 2016-04-27 DIAGNOSIS — Z124 Encounter for screening for malignant neoplasm of cervix: Secondary | ICD-10-CM

## 2016-04-27 DIAGNOSIS — Z3482 Encounter for supervision of other normal pregnancy, second trimester: Secondary | ICD-10-CM

## 2016-04-27 DIAGNOSIS — Z3492 Encounter for supervision of normal pregnancy, unspecified, second trimester: Secondary | ICD-10-CM

## 2016-04-27 DIAGNOSIS — E663 Overweight: Secondary | ICD-10-CM

## 2016-04-27 LAB — POCT URINALYSIS DIPSTICK
Bilirubin, UA: NEGATIVE
Blood, UA: NEGATIVE
Glucose, UA: NEGATIVE
KETONES UA: NEGATIVE
Leukocytes, UA: NEGATIVE
Nitrite, UA: NEGATIVE
PH UA: 6
SPEC GRAV UA: 1.02
Urobilinogen, UA: NEGATIVE

## 2016-04-27 NOTE — Progress Notes (Signed)
OBSTETRIC INITIAL PRENATAL VISIT  Subjective:    Madison Randall is being sVinnie Leveleen today for her first obstetrical visit.  This is not a planned pregnancy. She is a [id number] female at 5971w1d gestation, Estimated Date of Delivery: 11/01/16 with last menstrual period 01/26/2016 (exact date), consistent with 10 week sono. Her obstetrical history is significant for none. Relationship with FOB: significant other, living together. Patient does intend to breast feed. Pregnancy history fully reviewed.   Obstetric History   G5   P3   T3   P0   A1   L3    SAB0   TAB0   Ectopic0   Multiple0   Live Births3     # Outcome Date GA Lbr Len/2nd Weight Sex Delivery Anes PTL Lv  5 Current           4 SAB 12/02/15        ND  3 Term 08/21/14 6144w2d  9 lb 4 oz (4.196 kg) F Vag-Spont  N LIV  2 Term 02/15/10 2920w0d  8 lb 13 oz (3.997 kg) F Vag-Spont  N LIV  1 Term 12/10/07 901w3d  8 lb 4 oz (3.742 kg) M Vag-Spont   LIV      Gynecologic History:  Last pap smear was 2014.  Results were normal.  Denies h/o abnormal pap smears in the past.  Contraception: None Denies history of STIs.    Past Medical History:  Diagnosis Date  . Amenorrhea   . Migraines     Family History  Problem Relation Age of Onset  . Heart disease Father   . Breast cancer Maternal Grandmother   . Thyroid disease Maternal Grandmother   . Heart disease Maternal Grandfather   . Breast cancer Paternal Grandmother   . Breast cancer Other     Past Surgical History:  Procedure Laterality Date  . DILATION AND CURETTAGE OF UTERUS  09/2014 uterine infection form delivery 2015    Social History   Social History  . Marital status: Married    Spouse name: N/A  . Number of children: N/A  . Years of education: N/A   Occupational History  . Not on file.   Social History Main Topics  . Smoking status: Former Games developermoker  . Smokeless tobacco: Never Used  . Alcohol use No  . Drug use: No  . Sexual activity: Yes   Other Topics Concern  .  Not on file   Social History Narrative  . No narrative on file    Current Outpatient Prescriptions on File Prior to Visit  Medication Sig Dispense Refill  . Prenatal Vit-Fe Fumarate-FA (PRENATAL MULTIVITAMIN) TABS tablet Take 1 tablet by mouth daily at 12 noon.     No current facility-administered medications on file prior to visit.      Allergies  Allergen Reactions  . Tdap [Diphth-Acell Pertussis-Tetanus] Anaphylaxis  . Imitrex [Sumatriptan] Other (See Comments)    syncope  . Topamax [Topiramate] Palpitations    Review of Systems General:Not Present- Fever, Weight Loss and Weight Gain. Skin:Not Present- Rash. HEENT:Not Present- Blurred Vision, Headache and Bleeding Gums. Respiratory:Not Present- Difficulty Breathing. Breast:Not Present- Breast Mass. Cardiovascular:Not Present- Chest Pain, Elevated Blood Pressure, Fainting / Blacking Out and Shortness of Breath. Gastrointestinal:Not Present- Abdominal Pain, Constipation, Nausea and Vomiting. Female Genitourinary:Not Present- Frequency, Painful Urination, Pelvic Pain, Vaginal Bleeding, Vaginal Discharge, Contractions, regular, Fetal Movements Decreased, Urinary Complaints and Vaginal Fluid. Musculoskeletal:Not Present- Back Pain and Leg Cramps. Neurological:Present - headaches (migraines ~ 1/weekly). Not Present-  Dizziness. Psychiatric:Not Present- Depression.     Objective:   Blood pressure 107/69, pulse 89, weight 156 lb 6.4 oz (70.9 kg), last menstrual period 01/26/2016.  Body mass index is 27.71 kg/m.  General Appearance:    Alert, cooperative, no distress, appears stated age, overweight  Head:    Normocephalic, without obvious abnormality, atraumatic  Eyes:    PERRL, conjunctiva/corneas clear, EOM's intact, both eyes  Ears:    Normal external ear canals, both ears  Nose:   Nares normal, septum midline, mucosa normal, no drainage or sinus tenderness  Throat:   Lips, mucosa, and tongue normal; teeth and  gums normal  Neck:   Supple, symmetrical, trachea midline, no adenopathy; thyroid: no enlargement/tenderness/nodules; no carotid bruit or JVD  Back:     Symmetric, no curvature, ROM normal, no CVA tenderness  Lungs:     Clear to auscultation bilaterally, respirations unlabored  Chest Wall:    No tenderness or deformity   Heart:    Regular rate and rhythm, S1 and S2 normal, no murmur, rub or gallop  Breast Exam:    No tenderness, masses, or nipple abnormality  Abdomen:     Soft, non-tender, bowel sounds active all four quadrants, no masses, no organomegaly.  FH 13 cm.  FHT 162  bpm.  Genitalia:    Pelvic:external genitalia normal, vagina without lesions, discharge, or tenderness, rectovaginal septum  normal. Cervix normal in appearance, no cervical motion tenderness, no adnexal masses or tenderness.  Pregnancy positive findings: uterine enlargement: 13 wk size, nontender.   Rectal:    Normal external sphincter.  No hemorrhoids appreciated. Internal exam not done.   Extremities:   Extremities normal, atraumatic, no cyanosis or edema  Pulses:   2+ and symmetric all extremities  Skin:   Skin color, texture, turgor normal, no rashes or lesions  Lymph nodes:   Cervical, supraclavicular, and axillary nodes normal  Neurologic:   CNII-XII intact, normal strength, sensation and reflexes throughout     Assessment:   Pregnancy at 13 and 1/7 weeks   Overweight H/o migraines   Plan:   Pap smear performed today.  Initial labs reviewed. Prenatal vitamins encouraged. Problem list reviewed and updated. New OB counseling:  The patient has been given an overview regarding routine prenatal care.  Recommendations regarding diet, weight gain, and exercise in pregnancy were given. Prenatal testing, optional genetic testing, and ultrasound use in pregnancy were reviewed.  AFP3 discussed: ordered. Benefits of Breast Feeding were discussed. The patient is encouraged to consider nursing her baby post partum. H/o  migraines, patient currently with 1 headache per week.  Uses natural remedies as other prescription medications have caused side effects in the past.  Follow up in 4 weeks.  50% of 30 min visit spent on counseling and coordination of care.    Hildred Laser, MD [facility]

## 2016-04-28 DIAGNOSIS — E663 Overweight: Secondary | ICD-10-CM | POA: Insufficient documentation

## 2016-04-28 DIAGNOSIS — Z3480 Encounter for supervision of other normal pregnancy, unspecified trimester: Secondary | ICD-10-CM | POA: Insufficient documentation

## 2016-05-02 LAB — PAP IG AND HPV HIGH-RISK
HPV, HIGH-RISK: NEGATIVE
PAP SMEAR COMMENT: 0

## 2016-05-25 ENCOUNTER — Ambulatory Visit (INDEPENDENT_AMBULATORY_CARE_PROVIDER_SITE_OTHER): Payer: Medicaid Other | Admitting: Obstetrics and Gynecology

## 2016-05-25 ENCOUNTER — Encounter: Payer: Self-pay | Admitting: Obstetrics and Gynecology

## 2016-05-25 VITALS — BP 140/78 | HR 84 | Wt 156.8 lb

## 2016-05-25 DIAGNOSIS — Z3482 Encounter for supervision of other normal pregnancy, second trimester: Secondary | ICD-10-CM

## 2016-05-25 DIAGNOSIS — O2612 Low weight gain in pregnancy, second trimester: Secondary | ICD-10-CM

## 2016-05-25 NOTE — Progress Notes (Signed)
Worried about not gaining weight.

## 2016-05-25 NOTE — Progress Notes (Signed)
ROB: Patient notes concerns about poor weight gain in pregnancy. Notes adequate food consumption, carb loading, drinking milk.  Discussed protein bar supplements, Also will give prescription for Boost. Now declines genetic testing, did not get 1st trimester screen done. RTC in 4 weeks, for anatomy scan. Unable to void today.

## 2016-05-25 NOTE — Progress Notes (Signed)
ROB

## 2016-06-22 ENCOUNTER — Ambulatory Visit (INDEPENDENT_AMBULATORY_CARE_PROVIDER_SITE_OTHER): Payer: Medicaid Other

## 2016-06-22 ENCOUNTER — Ambulatory Visit (INDEPENDENT_AMBULATORY_CARE_PROVIDER_SITE_OTHER): Payer: Medicaid Other | Admitting: Obstetrics and Gynecology

## 2016-06-22 VITALS — BP 122/73 | HR 83 | Wt 163.2 lb

## 2016-06-22 DIAGNOSIS — Z1389 Encounter for screening for other disorder: Secondary | ICD-10-CM

## 2016-06-22 DIAGNOSIS — H61032 Chondritis of left external ear: Secondary | ICD-10-CM | POA: Diagnosis not present

## 2016-06-22 DIAGNOSIS — Z3482 Encounter for supervision of other normal pregnancy, second trimester: Secondary | ICD-10-CM | POA: Diagnosis not present

## 2016-06-22 DIAGNOSIS — Z23 Encounter for immunization: Secondary | ICD-10-CM

## 2016-06-22 DIAGNOSIS — Z369 Encounter for antenatal screening, unspecified: Secondary | ICD-10-CM

## 2016-06-22 LAB — POCT URINALYSIS DIPSTICK
BILIRUBIN UA: NEGATIVE
Blood, UA: NEGATIVE
Glucose, UA: NEGATIVE
Ketones, UA: NEGATIVE
NITRITE UA: NEGATIVE
Protein, UA: NEGATIVE
Spec Grav, UA: 1.01
UROBILINOGEN UA: NEGATIVE
pH, UA: 7.5

## 2016-06-22 NOTE — Patient Instructions (Signed)

## 2016-06-22 NOTE — Progress Notes (Signed)
ROB: Patient with ear pain in cartilage region, achy, throbbing, intermittent.  Has had for years, worsens during pregnancy. Discussed auricular chondritis, take Tylenol, apply pressure.  S/p normal anatomy scan. Flu vaccine given.  RTC in 4 weeks.

## 2016-07-21 ENCOUNTER — Ambulatory Visit (INDEPENDENT_AMBULATORY_CARE_PROVIDER_SITE_OTHER): Payer: Medicaid Other | Admitting: Obstetrics and Gynecology

## 2016-07-21 VITALS — BP 100/62 | HR 72 | Wt 164.8 lb

## 2016-07-21 DIAGNOSIS — Z3482 Encounter for supervision of other normal pregnancy, second trimester: Secondary | ICD-10-CM | POA: Diagnosis not present

## 2016-07-21 DIAGNOSIS — Z131 Encounter for screening for diabetes mellitus: Secondary | ICD-10-CM

## 2016-07-21 DIAGNOSIS — R0989 Other specified symptoms and signs involving the circulatory and respiratory systems: Secondary | ICD-10-CM

## 2016-07-21 LAB — POCT URINALYSIS DIPSTICK
BILIRUBIN UA: NEGATIVE
Blood, UA: NEGATIVE
GLUCOSE UA: NEGATIVE
Ketones, UA: NEGATIVE
NITRITE UA: NEGATIVE
Protein, UA: NEGATIVE
Spec Grav, UA: 1.02
Urobilinogen, UA: NEGATIVE
pH, UA: 7.5

## 2016-07-21 NOTE — Progress Notes (Signed)
ROB: URI symptoms noted x 4 weeks, has just been using cough drops.  Advised on Robitussin, Sudafed, Mucinex, nasal saline spray. RTC in 4 weeks, for 28 week labs at that time.

## 2016-07-21 NOTE — Patient Instructions (Signed)
Minor Illnesses and Medications in Pregnancy  Cold/Flu:  Sudafed for congestion- Robitussin (plain) for cough- Tylenol for discomfort.  Please follow the directions on the label.  Try not to take any more than needed.  OTC Saline nasal spray and air humidifier or cool-mist  Vaporizer to sooth nasal irritation and to loosen congestion.  It is also important to increase intake of non carbonated fluids, especially if you have a fever.  Constipation:  Colace-2 capsules at bedtime; Metamucil- follow directions on label; Senokot- 1 tablet at bedtime.  Any one of these medications can be used.  It is also very important to increase fluids and fruits along with regular exercise.  If problem persists please call the office.  Diarrhea:  Kaopectate as directed on the label.  Eat a bland diet and increase fluids.  Avoid highly seasoned foods.  Headache:  Tylenol 1 or 2 tablets every 3-4 hours as needed  Indigestion:  Maalox, Mylanta, Tums or Rolaids- as directed on label.  Also try to eat small meals and avoid fatty, greasy or spicy foods.  Nausea with or without Vomiting:  Nausea in pregnancy is caused by increased levels of hormones in the body which influence the digestive system and cause irritation when stomach acids accumulate.  Symptoms usually subside after 1st trimester of pregnancy.  Try the following: 1. Keep saltines, graham crackers or dry toast by your bed to eat upon awakening. 2. Don't let your stomach get empty.  Try to eat 5-6 small meals per day instead of 3 large ones. 3. Avoid greasy fatty or highly seasoned foods.  4. Take OTC Unisom 1 tablet at bed time along with OTC Vitamin B6 25-50 mg 3 times per day.    If nausea continues with vomiting and you are unable to keep down food and fluids you may need a prescription medication.  Please notify your provider.   Sore throat:  Chloraseptic spray, throat lozenges and or plain Tylenol.  Vaginal Yeast Infection:  OTC Monistat for 7 days as  directed on label.  If symptoms do not resolve within a week notify provider.  If any of the above problems do not subside with recommended treatment please call the office for further assistance.   Do not take Aspirin, Advil, Motrin or Ibuprofen.  * * OTC= Over the counter  

## 2016-07-27 ENCOUNTER — Encounter: Payer: Self-pay | Admitting: Obstetrics and Gynecology

## 2016-07-28 ENCOUNTER — Telehealth: Payer: Self-pay | Admitting: Obstetrics and Gynecology

## 2016-07-28 NOTE — Telephone Encounter (Signed)
PT WAS MYCHART MESSAGING WITH YOU ABOUT IN EAR INFECTION, SHE WAS TOLD TO COME IN AT 11:30 FOR HER WORK IN SLOT PER YOU, WHEN SHE GOT HERE I DID NOT SEE HER ON THE SCHEDULE, TALKED TO YOU AND SHE WAS A WORK IN AND TOLD PT THAT DR CHERRY HAD TO GO AND DO A DELIVERY BUT WE COULD SEE HER THIS AFTERNOON AT 1:15 WITH DR DE SINCE DR CHERRY SCHEDULE WAS SO BUSY DUE TO DELIVERY, AND SHE WAS GOING THRU THE MYCHART MESSAGE AND SHE SAID WHO IS OKI AND I TOLD HER THAT WAS DR CHERRY'S NURSE, I THEN EXPLAINED TO HER AGAIN THAT DR CHERY HAD TO LEAVE FOR A DELIVERY TAHT WE NEVER KNOW WHEN BABIES COME, SHE LOOKED AT ME AND ROLLED HER EYES AND SIAD I LIVE ON THE OTHER SIDE OF TOWN IM NO COMING BACK UP HERE I'LL JUST LIVE WITH AN EAR INFECTION. SHE THEN TURNED AROUND AND WALKED OUT.  SHE IS AN OB PT...THISIS FOR DOCUMENTATION ON THIS INCIDENT ONLY

## 2016-08-22 NOTE — L&D Delivery Note (Signed)
Delivery Summary for Madison Randall  Labor Events:   Preterm labor:   Rupture date:   Rupture time:   Rupture type: Spontaneous  Fluid Color: Clear  Induction:   Augmentation:   Complications:   Cervical ripening:          Delivery:   Episiotomy:   Lacerations:   Repair suture:   Repair # of packets:   Blood loss (ml): 700   Information for the patient's newborn:  [name], Boy [name] [[id number]][[id number]]    Delivery 10/28/2016 6:33 AM by  Vaginal, Spontaneous Delivery Sex:  female Gestational Age: 3653w3d Delivery Clinician:   Living?:         APGARS  One minute Five minutes Ten minutes  Skin color:        Heart rate:        Grimace:        Muscle tone:        Breathing:        Totals: 8  9      Presentation/position:      Resuscitation:   Cord information:    Disposition of cord blood:     Blood gases sent?  Complications:   Placenta: Delivered:       appearance Newborn Measurements: Weight: 7 lb 13.2 oz (3550 g)  Height: 20.08"  Head circumference:    Chest circumference:    Other providers:    Additional  information: Forceps:   Vacuum:   Breech:   Observed anomalies        Delivery Note At 6:33 AM a viable and healthy female was delivered via Vaginal, Spontaneous Delivery (Presentation: Vertex; LOA position).  APGAR: 8, 9; weight 7 lb 13.2 oz (3550 g).   Placenta status: spontaneously removed, intact.  Cord: 3-vessel, with the following complications: None.  Cord pH: not obtained  Anesthesia:   Episiotomy: None Lacerations: 1st degree; Perineal Suture Repair: 3.0 vicryl Est. Blood Loss (mL): 700. Cytotec placed rectally.   Mom to postpartum.  Baby to Couplet care / Skin to Skin.  [name] 10/29/2016, 12:00 PM

## 2016-08-23 ENCOUNTER — Ambulatory Visit (INDEPENDENT_AMBULATORY_CARE_PROVIDER_SITE_OTHER): Payer: Medicaid Other | Admitting: Obstetrics and Gynecology

## 2016-08-23 ENCOUNTER — Other Ambulatory Visit: Payer: Medicaid Other

## 2016-08-23 VITALS — BP 106/70 | HR 93 | Wt 169.7 lb

## 2016-08-23 DIAGNOSIS — Z3482 Encounter for supervision of other normal pregnancy, second trimester: Secondary | ICD-10-CM

## 2016-08-23 DIAGNOSIS — Z862 Personal history of diseases of the blood and blood-forming organs and certain disorders involving the immune mechanism: Secondary | ICD-10-CM

## 2016-08-23 DIAGNOSIS — Z3009 Encounter for other general counseling and advice on contraception: Secondary | ICD-10-CM

## 2016-08-23 DIAGNOSIS — Z131 Encounter for screening for diabetes mellitus: Secondary | ICD-10-CM

## 2016-08-23 DIAGNOSIS — Z130 Encounter for screening for diseases of the blood and blood-forming organs and certain disorders involving the immune mechanism: Secondary | ICD-10-CM

## 2016-08-23 DIAGNOSIS — J069 Acute upper respiratory infection, unspecified: Secondary | ICD-10-CM

## 2016-08-23 DIAGNOSIS — Z3483 Encounter for supervision of other normal pregnancy, third trimester: Secondary | ICD-10-CM

## 2016-08-23 DIAGNOSIS — Z8759 Personal history of other complications of pregnancy, childbirth and the puerperium: Secondary | ICD-10-CM

## 2016-08-23 LAB — POCT URINALYSIS DIPSTICK
Bilirubin, UA: NEGATIVE
Blood, UA: NEGATIVE
GLUCOSE UA: 100
Ketones, UA: NEGATIVE
Nitrite, UA: NEGATIVE
Protein, UA: NEGATIVE
SPEC GRAV UA: 1.015
Urobilinogen, UA: NEGATIVE
pH, UA: 6.5

## 2016-08-23 MED ORDER — AZITHROMYCIN 250 MG PO TABS: ORAL_TABLET | ORAL | 1 refills | Status: DC

## 2016-08-23 MED ORDER — HYDROCOD POLST-CPM POLST ER 10-8 MG/5ML PO SUER: 5.0000 mL | Freq: Two times a day (BID) | ORAL | 0 refills | Status: DC | PRN

## 2016-08-23 NOTE — Progress Notes (Signed)
ROB: Still noting URI symptoms and persistent cough. Has been using OTC meds as recommended with no relief.  Notes that she had an ear ache several weeks ago, took some left-over Amoxicilin from prior prescription which helped.  Will prescribe Z-pack and Tussionex for symptoms.  For 28 week labs today.  Desires to breastfeed,  desires BTL for contraception (but only if C-section delivery).  Discussed all forms of contraception. Will either maintain abstinence (as she is separated from her husband), or will consider Paraguard IUD if no C-section. Discussed risks vs benefits of BTL.  Medicaid papers signed. Signed blood consent, discussed cord blood banking. Patient reports h/o postpartum hemorrhage in 1st trimester, almost requiring blood transfusion. Allergic to Tdap. RTC in 2 weeks.

## 2016-08-24 LAB — GLUCOSE, 1 HOUR GESTATIONAL: GESTATIONAL DIABETES SCREEN: 131 mg/dL (ref 65–139)

## 2016-08-24 LAB — HEMOGLOBIN AND HEMATOCRIT, BLOOD
Hematocrit: 32.3 % — ABNORMAL LOW (ref 34.0–46.6)
Hemoglobin: 11 g/dL — ABNORMAL LOW (ref 11.1–15.9)

## 2016-09-06 ENCOUNTER — Ambulatory Visit (INDEPENDENT_AMBULATORY_CARE_PROVIDER_SITE_OTHER): Payer: Medicaid Other | Admitting: Certified Nurse Midwife

## 2016-09-06 VITALS — BP 112/68 | HR 84 | Wt 168.2 lb

## 2016-09-06 DIAGNOSIS — Z3493 Encounter for supervision of normal pregnancy, unspecified, third trimester: Secondary | ICD-10-CM

## 2016-09-06 LAB — POCT URINALYSIS DIPSTICK
Bilirubin, UA: NEGATIVE
GLUCOSE UA: NEGATIVE
NITRITE UA: NEGATIVE
PH UA: 5
Protein, UA: NEGATIVE
RBC UA: NEGATIVE
Spec Grav, UA: 1.02
UROBILINOGEN UA: NEGATIVE

## 2016-09-06 MED ORDER — CEFDINIR 300 MG PO CAPS: 300.0000 mg | ORAL_CAPSULE | Freq: Two times a day (BID) | ORAL | 0 refills | Status: AC

## 2016-09-06 NOTE — Progress Notes (Signed)
ROB-Doing well pregnancy wise. Pt reports green, clear, bloody, thin-thick nasal discharge, ear congestion, and cough x 3 week. Minimal relief with Z-pak and home measure.Lungs CTAB, Heart RRR, ears clear. Rx Omnicef. Discussed red flag symptoms and when to call. RTC if symptoms worsen or fail to improve. RTC x 2 weeks for ROB.

## 2016-09-06 NOTE — Progress Notes (Signed)
Pt is c/o being Madison Randall for 3 weeks.

## 2016-09-14 ENCOUNTER — Encounter: Payer: Self-pay | Admitting: Obstetrics and Gynecology

## 2016-09-20 ENCOUNTER — Encounter: Payer: Self-pay | Admitting: Obstetrics and Gynecology

## 2016-09-21 ENCOUNTER — Ambulatory Visit (INDEPENDENT_AMBULATORY_CARE_PROVIDER_SITE_OTHER): Payer: Medicaid Other | Admitting: Obstetrics and Gynecology

## 2016-09-21 ENCOUNTER — Other Ambulatory Visit: Payer: Self-pay | Admitting: Obstetrics and Gynecology

## 2016-09-21 VITALS — BP 108/70 | HR 84 | Wt 172.4 lb

## 2016-09-21 DIAGNOSIS — R200 Anesthesia of skin: Secondary | ICD-10-CM

## 2016-09-21 DIAGNOSIS — R202 Paresthesia of skin: Secondary | ICD-10-CM

## 2016-09-21 DIAGNOSIS — Z3493 Encounter for supervision of normal pregnancy, unspecified, third trimester: Secondary | ICD-10-CM

## 2016-09-21 LAB — POCT URINALYSIS DIPSTICK
Bilirubin, UA: NEGATIVE
Glucose, UA: NEGATIVE
KETONES UA: NEGATIVE
LEUKOCYTES UA: NEGATIVE
NITRITE UA: NEGATIVE
PROTEIN UA: NEGATIVE
RBC UA: NEGATIVE
Spec Grav, UA: 1.025
Urobilinogen, UA: NEGATIVE
pH, UA: 5

## 2016-09-21 NOTE — Progress Notes (Signed)
Pt states that she had a one time episode where she had pain in her abdomen, abdomen became tight and hard. Pt states that she was very nauseated, after that began to have numbness bilaterally in all extremities. Pt was unable to stand or walk, and had no dexterity in her hands. Pt has h/o sciatic nerve pain but has never experienced an episode like this. Pt notes good fetal movement, and denies bleeding or leakage of fluid.

## 2016-09-23 LAB — MAGNESIUM: MAGNESIUM: 1.9 mg/dL (ref 1.6–2.3)

## 2016-09-23 LAB — CALCIUM: Calcium: 8.9 mg/dL (ref 8.7–10.2)

## 2016-09-23 NOTE — Progress Notes (Signed)
ROB: Patient noting tightness in abdomen x 1 episode, followed by numbness in all extremities x 10 minutes and nausea. Has a h/o sciatica. Discussed checking electrolytes, but is likely due to a pinched nerve/nerve compression due to fetal positioning. Discussed accupuncture, yoga, or massage. RTC in 2 weeks. For 36 week labs at that time.

## 2016-10-04 ENCOUNTER — Ambulatory Visit (INDEPENDENT_AMBULATORY_CARE_PROVIDER_SITE_OTHER): Payer: Medicaid Other | Admitting: Obstetrics and Gynecology

## 2016-10-04 ENCOUNTER — Encounter: Payer: Self-pay | Admitting: Obstetrics and Gynecology

## 2016-10-04 VITALS — BP 123/78 | HR 106 | Wt 173.1 lb

## 2016-10-04 DIAGNOSIS — O26893 Other specified pregnancy related conditions, third trimester: Secondary | ICD-10-CM

## 2016-10-04 DIAGNOSIS — N898 Other specified noninflammatory disorders of vagina: Secondary | ICD-10-CM

## 2016-10-04 DIAGNOSIS — N949 Unspecified condition associated with female genital organs and menstrual cycle: Secondary | ICD-10-CM

## 2016-10-04 DIAGNOSIS — Z3483 Encounter for supervision of other normal pregnancy, third trimester: Secondary | ICD-10-CM

## 2016-10-04 LAB — POCT URINALYSIS DIPSTICK
BILIRUBIN UA: NEGATIVE
Blood, UA: NEGATIVE
GLUCOSE UA: NEGATIVE
Ketones, UA: NEGATIVE
LEUKOCYTES UA: NEGATIVE
Nitrite, UA: NEGATIVE
Protein, UA: NEGATIVE
Spec Grav, UA: 1.01
Urobilinogen, UA: NEGATIVE
pH, UA: 5

## 2016-10-04 LAB — OB RESULTS CONSOLE GBS: STREP GROUP B AG: NEGATIVE

## 2016-10-04 NOTE — Progress Notes (Signed)
ROB: Patient c/o increased pressure. Denies contractions. Discussed methods of relieving pressure. 36 week labs done today. Vaginal discharge noted on exam, after questioning, patient has been noting vaignal irritation. Nuswab collected.  RTC in 1 week. Reviewed labor precautions.

## 2016-10-04 NOTE — Progress Notes (Signed)
36 week OB pt is here for routine prenatal visit. States she has had a lot of pressure.

## 2016-10-07 LAB — STREP GP B NAA: Strep Gp B NAA: NEGATIVE

## 2016-10-10 ENCOUNTER — Telehealth: Payer: Self-pay

## 2016-10-10 DIAGNOSIS — B373 Candidiasis of vulva and vagina: Secondary | ICD-10-CM

## 2016-10-10 DIAGNOSIS — B3731 Acute candidiasis of vulva and vagina: Secondary | ICD-10-CM

## 2016-10-10 LAB — NUSWAB VAGINITIS PLUS (VG+)
CANDIDA GLABRATA, NAA: NEGATIVE
CHLAMYDIA TRACHOMATIS, NAA: NEGATIVE
Candida albicans, NAA: POSITIVE — AB
Neisseria gonorrhoeae, NAA: NEGATIVE
Trich vag by NAA: NEGATIVE

## 2016-10-10 MED ORDER — TERCONAZOLE 0.4 % VA CREA: 1.0000 | TOPICAL_CREAM | Freq: Every day | VAGINAL | 0 refills | Status: DC

## 2016-10-10 NOTE — Telephone Encounter (Signed)
-----   Message from Hildred LaserAnika Cherry, MD sent at 10/10/2016 10:09 AM EST ----- Please inform patient of vaginal yeast infection. Needs treatment with Terazol cream nightly x 7 days

## 2016-10-10 NOTE — Telephone Encounter (Signed)
Called pt no answer. LM for pt informing her of the need for treatment for yeast. RX sent in.

## 2016-10-11 ENCOUNTER — Ambulatory Visit (INDEPENDENT_AMBULATORY_CARE_PROVIDER_SITE_OTHER): Payer: Medicaid Other | Admitting: Obstetrics and Gynecology

## 2016-10-11 VITALS — BP 105/72 | HR 79 | Wt 167.9 lb

## 2016-10-11 DIAGNOSIS — B373 Candidiasis of vulva and vagina: Secondary | ICD-10-CM

## 2016-10-11 DIAGNOSIS — Z3483 Encounter for supervision of other normal pregnancy, third trimester: Secondary | ICD-10-CM

## 2016-10-11 DIAGNOSIS — B3731 Acute candidiasis of vulva and vagina: Secondary | ICD-10-CM

## 2016-10-11 NOTE — Progress Notes (Signed)
ROB: Notes some congestion, mild, mixed with blood tinged clear discharge. Is using nasal saline spray and moisturizing nostrils. Patient informed of Candida on recent Nuswab, has not yet picked up Terazole cream.  Encouraged patient to do so. GBS neg.  Labor precautions reiterated. RTC in 1 week.

## 2016-10-12 ENCOUNTER — Telehealth: Payer: Self-pay

## 2016-10-12 ENCOUNTER — Ambulatory Visit (INDEPENDENT_AMBULATORY_CARE_PROVIDER_SITE_OTHER): Payer: Medicaid Other | Admitting: Obstetrics and Gynecology

## 2016-10-12 ENCOUNTER — Encounter: Payer: Self-pay | Admitting: Obstetrics and Gynecology

## 2016-10-12 VITALS — BP 109/79 | HR 87 | Wt 169.9 lb

## 2016-10-12 DIAGNOSIS — B373 Candidiasis of vulva and vagina: Secondary | ICD-10-CM

## 2016-10-12 DIAGNOSIS — Z3483 Encounter for supervision of other normal pregnancy, third trimester: Secondary | ICD-10-CM

## 2016-10-12 DIAGNOSIS — B3731 Acute candidiasis of vulva and vagina: Secondary | ICD-10-CM

## 2016-10-12 LAB — POCT URINALYSIS DIPSTICK
BILIRUBIN UA: NEGATIVE
Glucose, UA: NEGATIVE
KETONES UA: NEGATIVE
NITRITE UA: NEGATIVE
PH UA: 6.5
Protein, UA: NEGATIVE
RBC UA: NEGATIVE
Spec Grav, UA: 1.01
Urobilinogen, UA: NEGATIVE

## 2016-10-12 NOTE — Telephone Encounter (Signed)
Called pt she states that "Last night I had a gush of discharge and bodily fluids come out and I had to change panties and pants. I wondered if that was my water breaking but I had no contractions. It happened again within the hour and again I had to change. Every time I have peed since then I've been wiping discharge off.  I'm hurting there a lot and I've been laying still for probably 3 hours and just got up to pee and again I have panties full of this discharge and had to change.  This is my 4th almost full term pregnancy and I've never experienced this before."   Advised pt that she needs to come in and be seen for evaluation. Pt gave verbal understanding. Added pt to schedule.

## 2016-10-16 NOTE — Progress Notes (Signed)
Problem OB:  Patient complains of LOF today, starting at around 1:00 p.m.  Exam with no pooling, nitrazine negative.  Moderate amount of discharge and cream in vaginal vault.  Patient started treatment for vaginal yeast infection yesterday.  May be the cause of increased discharge/leaking.  Patient given reassurance.  RTC for next regularly scheduled OB visit in 1 week.

## 2016-10-18 ENCOUNTER — Ambulatory Visit (INDEPENDENT_AMBULATORY_CARE_PROVIDER_SITE_OTHER): Payer: Medicaid Other | Admitting: Obstetrics and Gynecology

## 2016-10-18 VITALS — BP 104/73 | HR 91 | Wt 173.0 lb

## 2016-10-18 DIAGNOSIS — Z3483 Encounter for supervision of other normal pregnancy, third trimester: Secondary | ICD-10-CM

## 2016-10-18 DIAGNOSIS — O26899 Other specified pregnancy related conditions, unspecified trimester: Secondary | ICD-10-CM

## 2016-10-18 DIAGNOSIS — R102 Pelvic and perineal pain: Secondary | ICD-10-CM

## 2016-10-18 LAB — POCT URINALYSIS DIPSTICK
BILIRUBIN UA: NEGATIVE
Glucose, UA: NEGATIVE
KETONES UA: NEGATIVE
Nitrite, UA: NEGATIVE
PH UA: 6
Protein, UA: NEGATIVE
RBC UA: NEGATIVE
SPEC GRAV UA: 1.01
Urobilinogen, UA: NEGATIVE

## 2016-10-18 NOTE — Progress Notes (Signed)
ROB: Notes increased pelvic pressure.  Given labor precautions. RTC in 1 week.

## 2016-10-25 ENCOUNTER — Ambulatory Visit (INDEPENDENT_AMBULATORY_CARE_PROVIDER_SITE_OTHER): Payer: Medicaid Other | Admitting: Obstetrics and Gynecology

## 2016-10-25 VITALS — BP 107/70 | HR 87 | Wt 175.1 lb

## 2016-10-25 DIAGNOSIS — O26893 Other specified pregnancy related conditions, third trimester: Secondary | ICD-10-CM

## 2016-10-25 DIAGNOSIS — Z3483 Encounter for supervision of other normal pregnancy, third trimester: Secondary | ICD-10-CM

## 2016-10-25 DIAGNOSIS — N949 Unspecified condition associated with female genital organs and menstrual cycle: Secondary | ICD-10-CM

## 2016-10-25 LAB — POCT URINALYSIS DIPSTICK
Bilirubin, UA: NEGATIVE
Glucose, UA: NEGATIVE
KETONES UA: NEGATIVE
NITRITE UA: NEGATIVE
PH UA: 6.5
Protein, UA: NEGATIVE
Spec Grav, UA: <=1.005
Urobilinogen, UA: NEGATIVE

## 2016-10-25 NOTE — Progress Notes (Signed)
ROB: Still noting lots of pelvic pressure.  Membrane stripping performed today. Reiterated labor precautions. RTC in 1 week if undelivered.

## 2016-10-27 ENCOUNTER — Inpatient Hospital Stay
Admission: EM | Admit: 2016-10-27 | Discharge: 2016-10-29 | DRG: 774 | Disposition: A | Discharge status: Home / Self Care | Payer: Medicaid Other | Attending: Obstetrics and Gynecology | Admitting: Obstetrics and Gynecology

## 2016-10-27 DIAGNOSIS — Z862 Personal history of diseases of the blood and blood-forming organs and certain disorders involving the immune mechanism: Secondary | ICD-10-CM

## 2016-10-27 DIAGNOSIS — O4290 Premature rupture of membranes, unspecified as to length of time between rupture and onset of labor, unspecified weeks of gestation: Secondary | ICD-10-CM | POA: Diagnosis present

## 2016-10-27 DIAGNOSIS — O9902 Anemia complicating childbirth: Secondary | ICD-10-CM | POA: Diagnosis present

## 2016-10-27 DIAGNOSIS — D649 Anemia, unspecified: Secondary | ICD-10-CM | POA: Diagnosis present

## 2016-10-27 DIAGNOSIS — O4202 Full-term premature rupture of membranes, onset of labor within 24 hours of rupture: Secondary | ICD-10-CM | POA: Diagnosis present

## 2016-10-27 DIAGNOSIS — Z3A39 39 weeks gestation of pregnancy: Secondary | ICD-10-CM

## 2016-10-27 DIAGNOSIS — Z87891 Personal history of nicotine dependence: Secondary | ICD-10-CM

## 2016-10-27 DIAGNOSIS — Z8249 Family history of ischemic heart disease and other diseases of the circulatory system: Secondary | ICD-10-CM | POA: Diagnosis not present

## 2016-10-27 DIAGNOSIS — Z8759 Personal history of other complications of pregnancy, childbirth and the puerperium: Secondary | ICD-10-CM

## 2016-10-27 LAB — CBC
HEMATOCRIT: 32 % — AB (ref 35.0–47.0)
HEMOGLOBIN: 10.5 g/dL — AB (ref 12.0–16.0)
MCH: 25.6 pg — AB (ref 26.0–34.0)
MCHC: 32.7 g/dL (ref 32.0–36.0)
MCV: 78.2 fL — ABNORMAL LOW (ref 80.0–100.0)
Platelets: 254 10*3/uL (ref 150–440)
RBC: 4.09 MIL/uL (ref 3.80–5.20)
RDW: 15.8 % — ABNORMAL HIGH (ref 11.5–14.5)
WBC: 12.2 10*3/uL — ABNORMAL HIGH (ref 3.6–11.0)

## 2016-10-27 LAB — RAPID HIV SCREEN (HIV 1/2 AB+AG)
HIV 1/2 ANTIBODIES: NONREACTIVE
HIV-1 P24 Antigen - HIV24: NONREACTIVE

## 2016-10-27 LAB — TYPE AND SCREEN
ABO/RH(D): A POS
Antibody Screen: NEGATIVE

## 2016-10-27 MED ORDER — OXYTOCIN 10 UNIT/ML IJ SOLN
INTRAMUSCULAR | Status: AC
  Filled 2016-10-27: qty 2

## 2016-10-27 MED ORDER — SOD CITRATE-CITRIC ACID 500-334 MG/5ML PO SOLN
30.0000 mL | ORAL | Status: DC | PRN
  Filled 2016-10-27: qty 30

## 2016-10-27 MED ORDER — MISOPROSTOL 200 MCG PO TABS
ORAL_TABLET | ORAL | Status: AC
  Administered 2016-10-28: 800 ug
  Filled 2016-10-27: qty 4

## 2016-10-27 MED ORDER — AMMONIA AROMATIC IN INHA
RESPIRATORY_TRACT | Status: AC
  Filled 2016-10-27: qty 10

## 2016-10-27 MED ORDER — LIDOCAINE HCL (PF) 1 % IJ SOLN
30.0000 mL | INTRAMUSCULAR | Status: DC | PRN
  Administered 2016-10-28: 30 mL via SUBCUTANEOUS

## 2016-10-27 MED ORDER — LACTATED RINGERS IV SOLN
INTRAVENOUS | Status: DC
  Administered 2016-10-27: 20:00:00 via INTRAVENOUS

## 2016-10-27 MED ORDER — OXYTOCIN BOLUS FROM INFUSION
500.0000 mL | Freq: Once | INTRAVENOUS | Status: AC
  Administered 2016-10-28: 500 mL via INTRAVENOUS

## 2016-10-27 MED ORDER — OXYCODONE-ACETAMINOPHEN 5-325 MG PO TABS: 1.0000 | ORAL_TABLET | ORAL | Status: DC | PRN

## 2016-10-27 MED ORDER — OXYTOCIN 40 UNITS IN LACTATED RINGERS INFUSION - SIMPLE MED
2.5000 [IU]/h | INTRAVENOUS | Status: DC
  Filled 2016-10-27: qty 1000

## 2016-10-27 MED ORDER — BUTORPHANOL TARTRATE 1 MG/ML IJ SOLN
1.0000 mg | INTRAMUSCULAR | Status: DC | PRN
  Administered 2016-10-28: 1 mg via INTRAVENOUS
  Filled 2016-10-27 (×3): qty 1

## 2016-10-27 MED ORDER — LIDOCAINE HCL (PF) 1 % IJ SOLN
INTRAMUSCULAR | Status: AC
  Filled 2016-10-27: qty 30

## 2016-10-27 MED ORDER — SODIUM CHLORIDE FLUSH 0.9 % IV SOLN
INTRAVENOUS | Status: AC
  Filled 2016-10-27: qty 10

## 2016-10-27 MED ORDER — OXYCODONE-ACETAMINOPHEN 5-325 MG PO TABS: 2.0000 | ORAL_TABLET | ORAL | Status: DC | PRN

## 2016-10-27 MED ORDER — LACTATED RINGERS IV SOLN: 500.0000 mL | INTRAVENOUS | Status: DC | PRN

## 2016-10-27 MED ORDER — ACETAMINOPHEN 325 MG PO TABS: 650.0000 mg | ORAL_TABLET | ORAL | Status: DC | PRN

## 2016-10-27 MED ORDER — ONDANSETRON HCL 4 MG/2ML IJ SOLN: 4.0000 mg | Freq: Four times a day (QID) | INTRAMUSCULAR | Status: DC | PRN

## 2016-10-27 MED ORDER — TERBUTALINE SULFATE 1 MG/ML IJ SOLN: 0.2500 mg | Freq: Once | INTRAMUSCULAR | Status: DC | PRN

## 2016-10-27 NOTE — H&P (Signed)
Obstetric History and Physical  Madison Randall is a 31 y.o. G4W1027 with IUP at [redacted]w[redacted]d presenting for PROM. Patient states she has been having  occasional contractions, none vaginal bleeding, ruptured, clear fluid membranes, with active fetal movement.    Prenatal Course Source of Care: [facility] with onset of care at 9 weeks Pregnancy complications or risks: Patient Active Problem List   Diagnosis Date Noted  . PROM (premature rupture of membranes) 10/27/2016  . History of postpartum hemorrhage 08/23/2016  . Upper respiratory tract infection 08/23/2016  . Overweight (BMI 25.0-29.9) 04/28/2016  . Supervision of normal intrauterine pregnancy in multigravida 04/28/2016  . Migraines 04/04/2016   She plans to breastfeed She desires IUD vs BTL for postpartum contraception. BTL only if she requires a C-section delivery.   Prenatal labs and studies: ABO, Rh: A/Positive/-- (08/14 1003) Antibody: Negative (08/14 1003) Rubella: 1.80 (08/14 1003) RPR: Non Reactive (08/14 1003)  HBsAg: Negative (08/14 1003)  HIV: Non Reactive (08/14 1003)  OZD:GUYQIHKV, Negative (02/13 0400) 1 hr Glucola normal Genetic screening declined Anatomy US normal   Past Medical History:  Diagnosis Date  . Amenorrhea   . Migraines     Past Surgical History:  Procedure Laterality Date  . DILATION AND CURETTAGE OF UTERUS  09/2014 uterine infection form delivery 2015    OB History  Gravida Para Term Preterm AB Living  5 3 3   1 3   SAB TAB Ectopic Multiple Live Births  1       3    # Outcome Date GA Lbr Len/2nd Weight Sex Delivery Anes PTL Lv  5 Current           4 SAB 12/02/15        ND  3 Term 08/21/14 [redacted]w[redacted]d  9 lb 4 oz (4.196 kg) F Vag-Spont  N LIV  2 Term 02/15/10 [redacted]w[redacted]d  8 lb 13 oz (3.997 kg) F Vag-Spont  N LIV  1 Term 12/10/07 [redacted]w[redacted]d  8 lb 4 oz (3.742 kg) M Vag-Spont   LIV      Social History   Social History  . Marital status: Married    Spouse name: N/A  . Number of children:  N/A  . Years of education: N/A   Social History Main Topics  . Smoking status: Former Games developer  . Smokeless tobacco: Never Used  . Alcohol use No  . Drug use: No  . Sexual activity: Yes   Other Topics Concern  . None   Social History Narrative  . None    Family History  Problem Relation Age of Onset  . Heart disease Father   . Breast cancer Maternal Grandmother   . Thyroid disease Maternal Grandmother   . Heart disease Maternal Grandfather   . Breast cancer Paternal Grandmother   . Breast cancer Other     Prescriptions Prior to Admission  Medication Sig Dispense Refill Last Dose  . Prenatal Vit-Fe Fumarate-FA (PRENATAL MULTIVITAMIN) TABS tablet Take 1 tablet by mouth daily at 12 noon.   Taking    Allergies  Allergen Reactions  . Tdap [Diphth-Acell Pertussis-Tetanus] Anaphylaxis  . Imitrex [Sumatriptan] Other (See Comments)    syncope  . Topamax [Topiramate] Palpitations    Review of Systems: Negative except for what is mentioned in HPI.  Physical Exam: BP 126/73 (BP Location: Left Arm)   Pulse 96   Temp 98.6 F (37 C) (Oral)   Resp 18   Ht 5\' 3"  (1.6 m)   Wt 175  lb (79.4 kg)   LMP 01/26/2016 (Exact Date)   BMI 31.00 kg/m  CONSTITUTIONAL: Well-developed, well-nourished female in no acute distress.  HENT:  Normocephalic, atraumatic, External right and left ear normal. Oropharynx is clear and moist EYES: Conjunctivae and EOM are normal. Pupils are equal, round, and reactive to light. No scleral icterus.  NECK: Normal range of motion, supple, no masses SKIN: Skin is warm and dry. No rash noted. Not diaphoretic. No erythema. No pallor. NEUROLOGIC: Alert and oriented to person, place, and time. Normal reflexes, muscle tone coordination. No cranial nerve deficit noted. PSYCHIATRIC: Normal mood and affect. Normal behavior. Normal judgment and thought content. CARDIOVASCULAR: Normal heart rate noted, regular rhythm RESPIRATORY: Effort and breath sounds normal, no  problems with respiration noted ABDOMEN: Soft, nontender, nondistended, gravid. MUSCULOSKELETAL: Normal range of motion. No edema and no tenderness. 2+ distal pulses.  Cervical Exam: Dilatation 2 cm   Effacement 40%   Station -3   Presentation: cephalic FHT:  Baseline rate 130 bpm   Variability moderate  Accelerations present   Decelerations none Contractions: Irregular contractions   Pertinent Labs/Studies:   Results for orders placed or performed during the hospital encounter of 10/27/16 (from the past 24 hour(s))  CBC     Status: Abnormal   Collection Time: 10/27/16  8:22 PM  Result Value Ref Range   WBC 12.2 (H) 3.6 - 11.0 K/uL   RBC 4.09 3.80 - 5.20 MIL/uL   Hemoglobin 10.5 (L) 12.0 - 16.0 g/dL   HCT 16.132.0 (L) 09.635.0 - 04.547.0 %   MCV 78.2 (L) 80.0 - 100.0 fL   MCH 25.6 (L) 26.0 - 34.0 pg   MCHC 32.7 32.0 - 36.0 g/dL   RDW 40.915.8 (H) 81.111.5 - 91.414.5 %   Platelets 254 150 - 440 K/uL  Type and screen [facility]     Status: None   Collection Time: 10/27/16  8:22 PM  Result Value Ref Range   ABO/RH(D) A POS    Antibody Screen NEG    Sample Expiration 10/30/2016   Rapid HIV screen (HIV 1/2 Ab+Ag) ([facility] Only)     Status: None   Collection Time: 10/27/16  8:22 PM  Result Value Ref Range   HIV-1 P24 Antigen - HIV24 NON REACTIVE NON REACTIVE   HIV 1/2 Antibodies NON REACTIVE NON REACTIVE   Interpretation (HIV Ag Ab)      A non reactive test result means that HIV 1 or HIV 2 antibodies and HIV 1 p24 antigen were not detected in the specimen.    Assessment : Madison Randall is a 31 y.o. [id number] at 8054w2d being admitted for PROM at term.  H/o postpartum hemorrhage.  Plan: Labor: Expectant management.  Induction/Augmentation as needed, per protocol FWB: Reassuring fetal heart tracing.  GBS negative. Delivery plan: Hopeful for vaginal delivery.  Will prepare for possible postpartum hemorrhage due to prior history.    Hildred LaserAnika Levia Waltermire, MD [facility]

## 2016-10-27 NOTE — OB Triage Note (Signed)
Madison Randall here with c/o ROM at 1630. Denies bleeding, reports positive FM.

## 2016-10-28 ENCOUNTER — Inpatient Hospital Stay: Payer: Medicaid Other | Admitting: Anesthesiology

## 2016-10-28 MED ORDER — ACETAMINOPHEN 325 MG PO TABS: 650.0000 mg | ORAL_TABLET | ORAL | Status: DC | PRN

## 2016-10-28 MED ORDER — HYDROCODONE-ACETAMINOPHEN 5-325 MG PO TABS
1.0000 | ORAL_TABLET | ORAL | Status: DC | PRN
  Administered 2016-10-29: 1 via ORAL
  Filled 2016-10-28: qty 1

## 2016-10-28 MED ORDER — WITCH HAZEL-GLYCERIN EX PADS: 1.0000 "application " | MEDICATED_PAD | CUTANEOUS | Status: DC | PRN

## 2016-10-28 MED ORDER — METHYLERGONOVINE MALEATE 0.2 MG PO TABS: 0.2000 mg | ORAL_TABLET | ORAL | Status: DC | PRN

## 2016-10-28 MED ORDER — NALBUPHINE HCL 10 MG/ML IJ SOLN
10.0000 mg | INTRAMUSCULAR | Status: DC | PRN
  Administered 2016-10-28: 10 mg via INTRAVENOUS

## 2016-10-28 MED ORDER — LIDOCAINE-EPINEPHRINE (PF) 1.5 %-1:200000 IJ SOLN
INTRAMUSCULAR | Status: DC | PRN
  Administered 2016-10-28: 3 mL

## 2016-10-28 MED ORDER — SODIUM CHLORIDE 0.9 % IV SOLN
INTRAVENOUS | Status: DC | PRN
  Administered 2016-10-28 (×3): 5 mL via EPIDURAL

## 2016-10-28 MED ORDER — BENZOCAINE-MENTHOL 20-0.5 % EX AERO
1.0000 "application " | INHALATION_SPRAY | CUTANEOUS | Status: DC | PRN
  Administered 2016-10-28: 1 via TOPICAL
  Filled 2016-10-28: qty 56

## 2016-10-28 MED ORDER — DIPHENHYDRAMINE HCL 25 MG PO CAPS: 25.0000 mg | ORAL_CAPSULE | Freq: Four times a day (QID) | ORAL | Status: DC | PRN

## 2016-10-28 MED ORDER — SIMETHICONE 80 MG PO CHEW
80.0000 mg | CHEWABLE_TABLET | ORAL | Status: DC | PRN
  Filled 2016-10-28: qty 1

## 2016-10-28 MED ORDER — DIBUCAINE 1 % RE OINT: 1.0000 "application " | TOPICAL_OINTMENT | RECTAL | Status: DC | PRN

## 2016-10-28 MED ORDER — PRENATAL MULTIVITAMIN CH
1.0000 | ORAL_TABLET | Freq: Every day | ORAL | Status: DC
  Administered 2016-10-28 – 2016-10-29 (×2): 1 via ORAL
  Filled 2016-10-28 (×2): qty 1

## 2016-10-28 MED ORDER — FENTANYL 2.5 MCG/ML W/ROPIVACAINE 0.2% IN NS 100 ML EPIDURAL INFUSION (ARMC-ANES)
EPIDURAL | Status: DC | PRN
  Administered 2016-10-28: 10 mL/h via EPIDURAL

## 2016-10-28 MED ORDER — BUTORPHANOL TARTRATE 1 MG/ML IJ SOLN
2.0000 mg | INTRAMUSCULAR | Status: DC | PRN
  Administered 2016-10-28: 2 mg via INTRAVENOUS
  Filled 2016-10-28: qty 2

## 2016-10-28 MED ORDER — ZOLPIDEM TARTRATE 5 MG PO TABS: 5.0000 mg | ORAL_TABLET | Freq: Every evening | ORAL | Status: DC | PRN

## 2016-10-28 MED ORDER — FENTANYL 2.5 MCG/ML W/ROPIVACAINE 0.2% IN NS 100 ML EPIDURAL INFUSION (ARMC-ANES)
EPIDURAL | Status: AC
  Filled 2016-10-28: qty 100

## 2016-10-28 MED ORDER — ONDANSETRON HCL 4 MG PO TABS: 4.0000 mg | ORAL_TABLET | ORAL | Status: DC | PRN

## 2016-10-28 MED ORDER — SENNOSIDES-DOCUSATE SODIUM 8.6-50 MG PO TABS
2.0000 | ORAL_TABLET | ORAL | Status: DC
  Administered 2016-10-29: 2 via ORAL
  Filled 2016-10-28: qty 2

## 2016-10-28 MED ORDER — FERROUS SULFATE 325 (65 FE) MG PO TABS
325.0000 mg | ORAL_TABLET | Freq: Two times a day (BID) | ORAL | Status: DC
  Administered 2016-10-28 – 2016-10-29 (×3): 325 mg via ORAL
  Filled 2016-10-28 (×3): qty 1

## 2016-10-28 MED ORDER — ONDANSETRON HCL 4 MG/2ML IJ SOLN: 4.0000 mg | INTRAMUSCULAR | Status: DC | PRN

## 2016-10-28 MED ORDER — IBUPROFEN 600 MG PO TABS
600.0000 mg | ORAL_TABLET | Freq: Four times a day (QID) | ORAL | Status: DC
  Administered 2016-10-28 – 2016-10-29 (×5): 600 mg via ORAL
  Filled 2016-10-28 (×5): qty 1

## 2016-10-28 MED ORDER — COCONUT OIL OIL
1.0000 "application " | TOPICAL_OIL | Status: DC | PRN
  Administered 2016-10-28: 1 via TOPICAL
  Filled 2016-10-28: qty 120

## 2016-10-28 MED ORDER — METHYLERGONOVINE MALEATE 0.2 MG/ML IJ SOLN: 0.2000 mg | INTRAMUSCULAR | Status: DC | PRN

## 2016-10-28 MED ORDER — LACTATED RINGERS IV SOLN: INTRAVENOUS | Status: DC

## 2016-10-28 MED ORDER — OXYTOCIN 40 UNITS IN LACTATED RINGERS INFUSION - SIMPLE MED
1.0000 m[IU]/min | INTRAVENOUS | Status: DC
  Administered 2016-10-28: 2 m[IU]/min via INTRAVENOUS

## 2016-10-28 MED ORDER — NALBUPHINE HCL 10 MG/ML IJ SOLN
INTRAMUSCULAR | Status: AC
  Administered 2016-10-28: 10 mg via INTRAVENOUS
  Filled 2016-10-28: qty 1

## 2016-10-28 NOTE — Anesthesia Procedure Notes (Signed)
Epidural Patient location during procedure: OB Start time: 10/28/2016 3:45 AM End time: 10/28/2016 4:05 AM  Staffing Anesthesiologist: Alver FisherPENWARDEN, Gerhart Ruggieri Performed: anesthesiologist   Preanesthetic Checklist Completed: patient identified, site marked, surgical consent, pre-op evaluation, timeout performed, IV checked, risks and benefits discussed and monitors and equipment checked  Epidural Patient position: sitting Prep: ChloraPrep Patient monitoring: heart rate, continuous pulse ox and blood pressure Approach: midline Location: L3-L4 Injection technique: LOR saline  Needle:  Needle type: Tuohy  Needle gauge: 18 G Needle length: 9 cm and 9 Needle insertion depth: 6 cm Catheter type: closed end flexible Catheter size: 20 Guage Catheter at skin depth: 10 cm Test dose: negative (0.125% bupivacaine)  Assessment Events: blood not aspirated, injection not painful, no injection resistance, negative IV test and no paresthesia  Additional Notes   Patient tolerated the insertion well without complications.Reason for block:procedure for pain

## 2016-10-28 NOTE — Anesthesia Preprocedure Evaluation (Signed)
Anesthesia Evaluation  Patient identified by MRN, date of birth, ID band Patient awake    Reviewed: Allergy & Precautions, NPO status , Patient's Chart, lab work & pertinent test results  History of Anesthesia Complications Negative for: history of anesthetic complications  Airway Mallampati: II  TM Distance: >3 FB Neck ROM: Full    Dental no notable dental hx.    Pulmonary neg sleep apnea, neg COPD, former smoker,    breath sounds clear to auscultation- rhonchi (-) wheezing      Cardiovascular Exercise Tolerance: Good (-) hypertension(-) CAD and (-) Past MI  Rhythm:Regular Rate:Normal - Systolic murmurs and - Diastolic murmurs    Neuro/Psych  Headaches, negative psych ROS   GI/Hepatic negative GI ROS, Neg liver ROS,   Endo/Other  negative endocrine ROSneg diabetes  Renal/GU negative Renal ROS     Musculoskeletal negative musculoskeletal ROS (+)   Abdominal (+) - obese, Gravid abdomen  Peds  Hematology negative hematology ROS (+)   Anesthesia Other Findings   Reproductive/Obstetrics (+) Pregnancy                             Anesthesia Physical Anesthesia Plan  ASA: II  Anesthesia Plan: Epidural   Post-op Pain Management:    Induction:   Airway Management Planned:   Additional Equipment:   Intra-op Plan:   Post-operative Plan:   Informed Consent: I have reviewed the patients History and Physical, chart, labs and discussed the procedure including the risks, benefits and alternatives for the proposed anesthesia with the patient or authorized representative who has indicated his/her understanding and acceptance.     Plan Discussed with: Anesthesiologist  Anesthesia Plan Comments: (Plan for epidural for labor, discussed epidural vs spinal vs GA if need for csection)        Lab Results  Component Value Date   WBC 12.2 (H) 10/27/2016   HGB 10.5 (L) 10/27/2016   HCT  32.0 (L) 10/27/2016   MCV 78.2 (L) 10/27/2016   PLT 254 10/27/2016    Anesthesia Quick Evaluation

## 2016-10-29 ENCOUNTER — Encounter: Payer: Self-pay | Admitting: Obstetrics and Gynecology

## 2016-10-29 LAB — CBC
HEMATOCRIT: 25.6 % — AB (ref 35.0–47.0)
Hemoglobin: 8.5 g/dL — ABNORMAL LOW (ref 12.0–16.0)
MCH: 25.9 pg — ABNORMAL LOW (ref 26.0–34.0)
MCHC: 33.4 g/dL (ref 32.0–36.0)
MCV: 77.6 fL — ABNORMAL LOW (ref 80.0–100.0)
PLATELETS: 266 10*3/uL (ref 150–440)
RBC: 3.3 MIL/uL — ABNORMAL LOW (ref 3.80–5.20)
RDW: 15.6 % — AB (ref 11.5–14.5)
WBC: 13.8 10*3/uL — AB (ref 3.6–11.0)

## 2016-10-29 LAB — RPR: RPR Ser Ql: NONREACTIVE

## 2016-10-29 MED ORDER — FERROUS SULFATE 325 (65 FE) MG PO TABS: 325.0000 mg | ORAL_TABLET | Freq: Two times a day (BID) | ORAL | 2 refills | Status: DC

## 2016-10-29 MED ORDER — DOCUSATE SODIUM 100 MG PO CAPS: 100.0000 mg | ORAL_CAPSULE | Freq: Two times a day (BID) | ORAL | 2 refills | Status: DC | PRN

## 2016-10-29 MED ORDER — IBUPROFEN 800 MG PO TABS: 800.0000 mg | ORAL_TABLET | Freq: Three times a day (TID) | ORAL | 1 refills | Status: DC | PRN

## 2016-10-29 NOTE — Discharge Summary (Signed)
Obstetric Discharge Summary Reason for Admission: rupture of membranes Prenatal Procedures: ultrasound Intrapartum Procedures: spontaneous vaginal delivery Postpartum Procedures: none Complications-Operative and Postpartum: hemorrhage (treated with Cytotec)  CBC Latest Ref Rng & Units 10/29/2016 10/27/2016 08/23/2016  WBC 3.6 - 11.0 K/uL 13.8(H) 12.2(H) -  Hemoglobin 12.0 - 16.0 g/dL 1.6(X8.5(L) 10.5(L) -  Hematocrit 35.0 - 47.0 % 25.6(L) 32.0(L) 32.3(L)  Platelets 150 - 440 K/uL 266 254 -    Physical Exam:  Vitals:   10/28/16 2010 10/28/16 2332 10/29/16 0347 10/29/16 0833  BP: 136/76 129/81 116/67 124/67  Pulse: 78 76 92 80  Resp: 20 20 20 18   Temp: 98.3 F (36.8 C) 97.6 F (36.4 C) 97.8 F (36.6 C) 98.5 F (36.9 C)  TempSrc: Oral Oral Oral Oral  SpO2:   100% 100%  Weight:      Height:       General: alert and no distress Lochia: appropriate Uterine Fundus: firm Incision: None DVT Evaluation: No evidence of DVT seen on physical exam. Negative Homan's sign. No cords or calf tenderness. No significant calf/ankle edema.  Discharge Diagnoses: Term Pregnancy-delivered  Discharge Information: Date: 10/29/2016 Activity: pelvic rest Diet: routine Medications: PNV, Ibuprofen, Colace and Iron Condition: stable Instructions: refer to practice specific booklet Discharge to: home Follow-up Information    Madison LaserAnika Dillyn Menna, MD. Schedule an appointment as soon as possible for a visit in 6 week(s).   Specialties:  Obstetrics and Gynecology, Radiology Why:  Postpartum visit Contact information: [address] [place] [address] [phone]           Newborn Data: Live born female  Birth Weight: 7 lb 13.2 oz (3550 g) APGAR: 8, 9  Home with mother.  Madison Randall 10/29/2016, 12:07 PM

## 2016-10-29 NOTE — Anesthesia Postprocedure Evaluation (Signed)
Anesthesia Post Note  Patient: Madison Randall  Procedure(s) Performed: * No procedures listed *  Patient location during evaluation: Mother Baby Anesthesia Type: Epidural Level of consciousness: awake and alert Pain management: pain level controlled Vital Signs Assessment: post-procedure vital signs reviewed and stable Respiratory status: spontaneous breathing, nonlabored ventilation and respiratory function stable Cardiovascular status: stable Postop Assessment: no headache, no backache and epidural receding Anesthetic complications: no     Last Vitals:  Vitals:   10/29/16 0347 10/29/16 0833  BP: 116/67 124/67  Pulse: 92 80  Resp: 20 18  Temp: 36.6 C 36.9 C    Last Pain:  Vitals:   10/29/16 0833  TempSrc: Oral  PainSc:                  [name] S

## 2016-10-29 NOTE — Progress Notes (Signed)
Post Partum Day # 1, s/p SVD, with PPH (treated with Cytotec)  Subjective: no complaints, up ad lib, voiding and tolerating PO  Objective: Temp:  [97.6 F (36.4 C)-98.5 F (36.9 C)] 98.5 F (36.9 C) (03/10 0833) Pulse Rate:  [76-92] 80 (03/10 0833) Resp:  [16-20] 18 (03/10 0833) BP: (116-136)/(67-81) 124/67 (03/10 0833) SpO2:  [100 %] 100 % (03/10 [id number])  Physical Exam:  General: alert and no distress  Lungs: clear to auscultation bilaterally Breasts: normal appearance, no masses or tenderness Heart: regular rate and rhythm, S1, S2 normal, no murmur, click, rub or gallop Pelvis: Lochia: appropriate, Uterine Fundus: firm Extremities: DVT Evaluation: No evidence of DVT seen on physical exam. Negative Homan's sign. No cords or calf tenderness. No significant calf/ankle edema.   Recent Labs  10/27/16 2022 10/29/16 0419  HGB 10.5* 8.5*  HCT 32.0* 25.6*    Assessment/Plan: Discharge home, Breastfeeding and Contraception female vasectomy Anemia of pregnancy, exacerbated by postpartum hemorrhage. Asymptomatic.  Will treat with PO iron supplementation.    LOS: 2 days   Hildred LaserAnika Hyacinth Marcelli, MD [facility] 10/29/2016 12:01 PM

## 2016-10-29 NOTE — Discharge Summary (Signed)
Pt. Received discharge teaching, prescriptions,and info about follow-up appointment. Pt's ID band was matched with baby's band. Pt. Gathered belongings on cart and was wheeled with baby downstairs.

## 2016-11-01 ENCOUNTER — Encounter: Payer: Medicaid Other | Admitting: Obstetrics and Gynecology

## 2017-01-05 ENCOUNTER — Ambulatory Visit (INDEPENDENT_AMBULATORY_CARE_PROVIDER_SITE_OTHER): Payer: Medicaid Other | Admitting: Obstetrics and Gynecology

## 2017-01-05 ENCOUNTER — Encounter: Payer: Self-pay | Admitting: Obstetrics and Gynecology

## 2017-01-05 DIAGNOSIS — Z30011 Encounter for initial prescription of contraceptive pills: Secondary | ICD-10-CM

## 2017-01-05 DIAGNOSIS — Z8759 Personal history of other complications of pregnancy, childbirth and the puerperium: Secondary | ICD-10-CM

## 2017-01-05 DIAGNOSIS — R030 Elevated blood-pressure reading, without diagnosis of hypertension: Secondary | ICD-10-CM

## 2017-01-05 DIAGNOSIS — Z862 Personal history of diseases of the blood and blood-forming organs and certain disorders involving the immune mechanism: Secondary | ICD-10-CM

## 2017-01-05 MED ORDER — NORETHINDRONE 0.35 MG PO TABS: 1.0000 | ORAL_TABLET | Freq: Every day | ORAL | 11 refills | Status: DC

## 2017-01-05 NOTE — Progress Notes (Signed)
   OBSTETRICS POSTPARTUM CLINIC PROGRESS NOTE  Subjective:     Madison Randall is a 31 y.o. [phone] female who presents for a postpartum visit. She is 10 weeks postpartum following a vaginal delivery. I have fully reviewed the prenatal and intrapartum course. The delivery was at 39 gestational weeks.  Anesthesia: epidural. Postpartum course has been well. Baby's course has been well. Baby is feeding by breast. Bleeding: patient has not resumed menses, with No LMP recorded.. Bowel function is normal. Bladder function is normal. Patient is sexually active (once, did not use protection). Contraception method desired is oral progesterone-only contraceptive, and interim vasectomy for partner. Postpartum depression screening: negative.  The following portions of the patient's history were reviewed and updated as appropriate: allergies, current medications, past family history, past medical history, past social history, past surgical history and problem list.  Review of Systems Pertinent items noted in HPI and remainder of comprehensive ROS otherwise negative.   Objective:    BP (!) 143/87 (BP Location: Left Arm, Patient Position: Sitting, Cuff Size: Normal)   Pulse (!) 105   Ht 5\' 3"  (1.6 m)   Breastfeeding? Yes   General:  alert and no distress   Breasts:  inspection negative, no nipple discharge or bleeding, no masses or nodularity palpable  Lungs: clear to auscultation bilaterally  Heart:  regular rate and rhythm, S1, S2 normal, no murmur, click, rub or gallop  Abdomen: soft, non-tender; bowel sounds normal; no masses,  no organomegaly.    Vulva:  normal  Vagina: normal vagina, no discharge, exudate, lesion, or erythema  Cervix:  no cervical motion tenderness and no lesions  Corpus: normal size, contour, position, consistency, mobility, non-tender  Adnexa:  normal adnexa and no mass, fullness, tenderness  Rectal Exam: Not performed.         Labs:  Lab Results  Component Value Date   HGB 8.5 (L) 10/29/2016     Assessment:    Routine postpartum exam.   Postpartum anemia Elevated BP Contraception management  Plan:    1. Contraception: oral progesterone-only contraceptive, and planning for vasectomy for female partner.  2. Will check Hgb for h/o anemia.  3. Patient with elevated BP, no prior h/o HTN pre-existing or during pregnancy. Advise patient to intermittently check BP at local pharmacy or grocery store.  If still elevated, made need further workup for diagnosis of HTN.  4. Follow up in: 6 months for annual exam, or sooner as needed.     Hildred Laserherry, Keshawn Fiorito, MD [facility]

## 2017-01-06 LAB — HEMOGLOBIN AND HEMATOCRIT, BLOOD
Hematocrit: 34 % (ref 34.0–46.6)
Hemoglobin: 10.3 g/dL — ABNORMAL LOW (ref 11.1–15.9)

## 2017-01-09 NOTE — Progress Notes (Signed)
Correct date and time of screening was 01/05/17 at 2:31 p.m.

## 2017-07-05 ENCOUNTER — Other Ambulatory Visit: Payer: Self-pay

## 2017-07-05 ENCOUNTER — Ambulatory Visit (INDEPENDENT_AMBULATORY_CARE_PROVIDER_SITE_OTHER): Payer: BLUE CROSS/BLUE SHIELD | Admitting: Nurse Practitioner

## 2017-07-05 ENCOUNTER — Encounter: Payer: Self-pay | Admitting: Nurse Practitioner

## 2017-07-05 VITALS — BP 115/62 | HR 85 | Temp 97.6°F | Ht 63.0 in | Wt 167.0 lb

## 2017-07-05 DIAGNOSIS — G43709 Chronic migraine without aura, not intractable, without status migrainosus: Secondary | ICD-10-CM | POA: Diagnosis not present

## 2017-07-05 DIAGNOSIS — M722 Plantar fascial fibromatosis: Secondary | ICD-10-CM

## 2017-07-05 DIAGNOSIS — Z7689 Persons encountering health services in other specified circumstances: Secondary | ICD-10-CM | POA: Diagnosis not present

## 2017-07-05 DIAGNOSIS — M792 Neuralgia and neuritis, unspecified: Secondary | ICD-10-CM

## 2017-07-05 DIAGNOSIS — L723 Sebaceous cyst: Secondary | ICD-10-CM

## 2017-07-05 NOTE — Progress Notes (Signed)
Subjective:    Patient ID: Madison Randall, female    DOB: 1985-11-04, 31 y.o.   MRN: [id number]  Madison Randall is a 31 y.o. female presenting on 07/05/2017 for Establish Care (intermittent sharp pain behind the right ear x 2 yrs. abscess under the the right arm pit x 2 yrs  ) and Foot Pain (pain worsen after prolong sitting )   HPI Establish Care New Provider Pt last seen by urgent care w/o PCP prior to today's visit.  Neuralgia Behind R ear has shooting pains lasting seconds ocur repeatedly over 3 days w/ intervals of seconds to minutes "like contractions" (2x per week to none in a month) for last 2 years.  NO predictable interval or trigger noted.  NO lesions accompany the pain. Her mother has trigeminal neuralgia.    Migraines  - regular migraines 5d/week until last 5 weeks.  Multiple meds, ENT, neurology - Started using DoTerra essential oils Vitality pack (vitamins).  Taking 7 weeks, but has no thad migraine in last 5 weeks. - Still breastfeeding and is not currently taking any medications for her headaches.  Does not wish to start prophylaxis or abortive therapy at this time.  Will consider once has completed breastfeeding.  Sebaceous Cyst Pt has a lump in her underarm skin that enlarges over time.  She reports regularly expressing the fluid from it.  Is concerned about deep scarring of the lump.  Requests dermatology referral.  Foot pain Pt reports significant foot pain bilaterally that is worst w/ standing after prolonged sitting.  Feels like entire foot hurts w/ pain on walking.  New since her last pregnancy (after delivery) 8 mos ago.  Does improve once weight bearing for several steps, but has to alter her gait initially.  Past Medical History:  Diagnosis Date  . Amenorrhea   . Migraine   . Migraines    Past Surgical History:  Procedure Laterality Date  . DILATION AND CURETTAGE OF UTERUS  09/2014 uterine infection form delivery 2015   Social History    Socioeconomic History  . Marital status: Married    Spouse name: Not on file  . Number of children: Not on file  . Years of education: Not on file  . Highest education Randall: Not on file  Social Needs  . Financial resource strain: Not on file  . Food insecurity - worry: Not on file  . Food insecurity - inability: Not on file  . Transportation needs - medical: Not on file  . Transportation needs - non-medical: Not on file  Occupational History  . Not on file  Tobacco Use  . Smoking status: Former Games developer  . Smokeless tobacco: Never Used  Substance and Sexual Activity  . Alcohol use: No  . Drug use: No  . Sexual activity: Yes    Birth control/protection: None  Other Topics Concern  . Not on file  Social History Narrative  . Not on file   Family History  Problem Relation Age of Onset  . Heart disease Father   . Alcohol abuse Father   . Breast cancer Maternal Grandmother   . Thyroid disease Maternal Grandmother   . Heart disease Maternal Grandfather   . Breast cancer Paternal Grandmother   . Breast cancer Other   . Other Mother        trigeminal neuralgia   Current Outpatient Medications on File Prior to Visit  Medication Sig  . Multiple Vitamins-Minerals (IMMUNE SUPPORT VITAMIN C) PACK Take by mouth.  No current facility-administered medications on file prior to visit.     Review of Systems  Constitutional: Negative.   HENT: Negative.   Eyes: Negative.   Respiratory: Negative.   Cardiovascular: Negative.   Gastrointestinal: Negative.   Endocrine: Negative.   Genitourinary: Negative.   Musculoskeletal: Positive for arthralgias (foot pain).  Skin: Negative.   Neurological: Positive for headaches (migraines w/ recent reduction in frequency).  Hematological: Negative.   Psychiatric/Behavioral: Positive for sleep disturbance (also r/t breastfeeding an infant).   Per HPI unless specifically indicated above     Objective:    BP 115/62 (BP Location: Right  Arm, Patient Position: Sitting, Cuff Size: Normal)   Pulse 85   Temp 97.6 F (36.4 C) (Oral)   Ht 5\' 3"  (1.6 m)   Wt 167 lb (75.8 kg)   LMP 07/02/2017 (Approximate)   Breastfeeding? Yes   BMI 29.58 kg/m   Wt Readings from Last 3 Encounters:  07/05/17 167 lb (75.8 kg)  01/05/17 171 lb 14.4 oz (78 kg)  10/27/16 175 lb (79.4 kg)    Physical Exam  Constitutional: She is oriented to person, place, and time. She appears well-developed and well-nourished. No distress.  HENT:  Head: Normocephalic and atraumatic.  Neck: Normal range of motion. Neck supple. No JVD present. No tracheal deviation present. No thyromegaly present.  Cardiovascular: Normal rate, regular rhythm, normal heart sounds and intact distal pulses.  Pulmonary/Chest: Effort normal and breath sounds normal. No respiratory distress.  Abdominal: Soft. Bowel sounds are normal.  Musculoskeletal:       Right foot: There is tenderness (tenderness of lateral plantar fascia). There is normal range of motion, no bony tenderness, no swelling, normal capillary refill, no crepitus, no deformity and no laceration.       Left foot: There is tenderness (tenderness of lateral plantar fascia). There is normal range of motion, no bony tenderness, no swelling, normal capillary refill, no crepitus, no deformity and no laceration.  Lymphadenopathy:    She has no cervical adenopathy.  Neurological: She is alert and oriented to person, place, and time. No cranial nerve deficit.  Skin: Skin is warm and dry. Lesion (r axillary sebaceous cyst) noted.     Psychiatric: She has a normal mood and affect. Her behavior is normal. Judgment and thought content normal.  Vitals reviewed.   Results for orders placed or performed in visit on 01/05/17  Hemoglobin and hematocrit, blood  Result Value Ref Range   Hemoglobin 10.3 (L) 11.1 - 15.9 g/dL   Hematocrit 16.134.0 09.634.0 - 46.6 %      Assessment & Plan:   Problem List Items Addressed This Visit       Cardiovascular and Mediastinum   Migraines    Currently completely controlled w/o headache in last 5 weeks. Pt w/ history of 5 headache days per week and had previously exhausted all pharmacologic options except for new Aimovig per pt report.  Is now using DoTerra essential oils vitality pack vitamins for last 7 weeks and attributes her reduction in migraines to this therapy.  Plan: 1. Discussed w/ pt that complementary therapies do not have great evidence to support use, but can be continued if pt perceives is effective, is having symptom relief, and are not causing harm. Pt verbalizes understanding. 2. Discussed new injectable medication option for future use if migraines return. 3. Followup for migraines as needed.        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  Pt has not used any medications for relief of symptoms as they resolve independently.  Plan: 1. Consider gabapentin for treatment during flares after breastfeeding is complete.  Currently pt states she does not need treatment as symptoms are tolerable. 2. Followup as needed.  Consider imaging or further testing in future if symptoms worsen or become more frequent.        Musculoskeletal and Integument   Plantar fasciitis, bilateral    Pt w/ reports of foot pain w/ standing, subacute in nature.  Worst pain is on bilateral lateral foot c/w plantar fasciitis.  Plan: 1. Provided plantar fasciitis stretches. 2. Treat with 200 mg every 8 hours ibuprofen for 2 weeks.  Low dose r/t breastfeeding. 3. Apply heat and/or ice to affected area. 4. May also apply a muscle rub with lidocaine after heat or ice. 5. Followup 2-4 weeks as needed.      Sebaceous cyst of right axilla - Primary    Chronic sebaceous cyst that pt reports she has self-expressed to keep symptoms controlled. Would like to have this removed.  Plan: 1. Referral to dermatology.  Advised pt to  avoid additional self - expression.  Recommend removal since is recurrent. 2. Followup as needed.      Relevant Orders   Ambulatory referral to Dermatology    Other Visit Diagnoses    Encounter to establish care       Pt w/o prior PCP.  NO records to review.  History reviewed in detail w/ pt today.       Follow up plan: Return in about 6 months (around 01/02/2018) for annual physical.  Wilhelmina McardleLauren Shalin Vonbargen, DNP, AGPCNP-BC Adult Gerontology Primary Care Nurse Practitioner [facility] 07/19/2017, 9:00 AM

## 2017-07-05 NOTE — Patient Instructions (Addendum)
[name], Thank you for coming in to clinic today.  1. For your ear pain: this is a neuralgia.   - there are treatments for your nerve pain, but they are not considered safe during lactation.  Call me when you would want to start medication.  I would use intermittent gabapentin when you have flares.  2. For your feet: - Treat with 200 mg every 8 hours ibuprofen for 2 weeks. - Apply heat and/or ice to affected area. - May also apply a muscle rub with lidocaine after heat or ice.       Plantar Fascia Stretches / Exercises  See other page with pictures of each exercise.  Start with 1 or 2 of these exercises that you are most comfortable with. Do not do any exercises that cause you significant worsening pain. Some of these may cause some "stretching soreness" but it should go away after you stop the exercise, and get better over time. Gradually increase up to 3-4 exercises as tolerated.  You may begin exercising the muscles of your foot right away by gently stretching them as follows:  Stretching: Towel stretch: Sit on a hard surface with your injured leg stretched out in front of you. Loop a towel around the ball of your foot and pull the towel toward your body keeping your knee straight. Hold this position for 15 to 30 seconds then relax. Repeat 3 times. When the towel stretch becomes to easy, you may begin doing the standing calf stretch.  Standing calf stretch: Facing a wall, put your hands against the wall at about eye level. Keep the injured leg back, the uninjured leg forward, and the heel of your injured leg on the floor. Turn your injured foot slightly inward (as if you were pigeon-toed) as you slowly lean into the wall until you feel a stretch in the back of your calf. Hold for 15 to 30 seconds. Repeat 3 times. Do this exercise several times each day. When you can stand comfortably on your injured foot, you can begin stretching the bottom of your foot using the plantar fascia  stretch.  Plantar fascia stretch: Stand with the ball of your injured foot on a stair. Reach for the bottom step with your heel until you feel a stretch in the arch of your foot. Hold this position for 15 to 30 seconds and then relax. Repeat 3 times. After you have stretched the bottom muscles of your foot, you can begin strengthening the top muscles of your foot.  Frozen can roll: Roll your bare injured foot back and forth from your heel to your mid-arch over a frozen juice can. Repeat for 3 to 5 minutes. This exercise is particularly helpful if done first thing in the morning. Towel pickup: With your heel on the ground, pick up a towel with your toes. Release. Repeat 10 to 20 times. When this gets easy, add more resistance by placing a book or small weight on the towel. Static and dynamic balance exercises Place a chair next to your non-injured leg and stand upright. (This will provide you with balance if needed.) Stand on your injured foot. Try to raise the arch of your foot while keeping your toes on the floor. Try to maintain this position and balance on your injured side for 30 seconds. This exercise can be made more difficult by doing it on a piece of foam or a pillow, or with your eyes closed. Stand in the same position as above. Keep your foot  in this position and reach forward in front of you with your injured side's hand, allowing your knee to bend. Repeat this 10 times while maintaining the arch height. This exercise can be made more difficult by reaching farther in front of you. Do 2 sets. Stand in the same position as above. While maintaining your arch height, reach the injured side's hand across your body toward the chair. The farther you reach, the more challenging the exercise. Do 2 sets of 10.  Next, you can begin strengthening the muscles of your foot and lower leg by using elastic tubing.  Strengthening: Resisted dorsiflexion: Sit with your injured leg out straight and your foot  facing a doorway. Tie a loop in one end of the tubing. Put your foot through the loop so that the tubing goes around the arch of your foot. Tie a knot in the other end of the tubing and shut the knot in the door. Move backward until there is tension in the tubing. Keeping your knee straight, pull your foot toward your body, stretching the tubing. Slowly return to the starting position. Do 3 sets of 10. Resisted plantar flexion: Sit with your leg outstretched and loop the middle section of the tubing around the ball of your foot. Hold the ends of the tubing in both hands. Gently press the ball of your foot down and point your toes, stretching the tubing. Return to the starting position. Do 3 sets of 10. Resisted inversion: Sit with your legs out straight and cross your uninjured leg over your injured ankle. Wrap the tubing around the ball of your injured foot and then loop it around your uninjured foot so that the tubing is anchored there at one end. Hold the other end of the tubing in your hand. Turn your injured foot inward and upward. This will stretch the tubing. Return to the starting position. Do 3 sets of 10. Resisted eversion: Sit with both legs stretched out in front of you, with your feet about a shoulder's width apart. Tie a loop in one end of the tubing. Put your injured foot through the loop so that the tubing goes around the arch of that foot and wraps around the outside of the uninjured foot. Hold onto the other end of the tubing with your hand to provide tension. Turn your injured foot up and out. Make sure you keep your uninjured foot still so that it will allow the tubing to stretch as you move your injured foot. Return to the starting position. Do 3 sets of 10.   3. For your sebaceous cyst: - Continue conservative management - avoiding heavy deodorant use,  - Shaving can irritate hair follicles and make this worse as well.  - Referral to dermatology has been sent.  They will call you to  schedule.  Please schedule a follow-up appointment with Wilhelmina McardleLauren Saifan Rayford, AGNP. Return in about 6 months (around 01/02/2018) for annual physical.  If you have any other questions or concerns, please feel free to call the clinic or send a message through MyChart. You may also schedule an earlier appointment if necessary.  You will receive a survey after today's visit either digitally by e-mail or paper by [employer] mail. Your experiences and feedback matter to us.  Please respond so we know how we are doing as we provide care for you.   Wilhelmina McardleLauren Jissel Slavens, DNP, AGNP-BC Adult Gerontology Nurse Practitioner [facility]

## 2017-07-18 ENCOUNTER — Encounter: Payer: Medicaid Other | Admitting: Obstetrics and Gynecology

## 2017-07-19 ENCOUNTER — Encounter: Payer: Self-pay | Admitting: Nurse Practitioner

## 2017-07-19 DIAGNOSIS — L723 Sebaceous cyst: Secondary | ICD-10-CM | POA: Insufficient documentation

## 2017-07-19 DIAGNOSIS — M722 Plantar fascial fibromatosis: Secondary | ICD-10-CM | POA: Insufficient documentation

## 2017-07-19 DIAGNOSIS — M792 Neuralgia and neuritis, unspecified: Secondary | ICD-10-CM | POA: Insufficient documentation

## 2017-07-19 NOTE — Assessment & Plan Note (Addendum)
Pt w/ reports of foot pain w/ standing, subacute in nature.  Worst pain is on bilateral lateral foot c/w plantar fasciitis.  Plan: 1. Provided plantar fasciitis stretches. 2. Treat with 200 mg every 8 hours ibuprofen for 2 weeks.  Low dose r/t breastfeeding. 3. Apply heat and/or ice to affected area. 4. May also apply a muscle rub with lidocaine after heat or ice. 5. Followup 2-4 weeks as needed.

## 2017-07-19 NOTE — Assessment & Plan Note (Signed)
Currently completely controlled w/o headache in last 5 weeks. Pt w/ history of 5 headache days per week and had previously exhausted all pharmacologic options except for new Aimovig per pt report.  Is now using DoTerra essential oils vitality pack vitamins for last 7 weeks and attributes her reduction in migraines to this therapy.  Plan: 1. Discussed w/ pt that complementary therapies do not have great evidence to support use, but can be continued if pt perceives is effective, is having symptom relief, and are not causing harm. Pt verbalizes understanding. 2. Discussed new injectable medication option for future use if migraines return. 3. Followup for migraines as needed.

## 2017-07-19 NOTE — Assessment & Plan Note (Signed)
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 Would like to have this removed.  Plan: 1. Referral to dermatology.  Advised pt to avoid additional self - expression.  Recommend removal since is recurrent. 2. Followup as needed.

## 2017-07-19 NOTE — Assessment & Plan Note (Signed)
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  Pt has not used any medications for relief of symptoms as they resolve independently.  Plan: 1. Consider gabapentin for treatment during flares after breastfeeding is complete.  Currently pt states she does not need treatment as symptoms are tolerable. 2. Followup as needed.  Consider imaging or further testing in future if symptoms worsen or become more frequent.

## 2018-08-22 NOTE — L&D Delivery Note (Signed)
Delivery Summary for Madison Randall  Labor Events:   Preterm labor:   Rupture date: 03/15/2019  Rupture time: 12:00 PM  Rupture type: Spontaneous Possible ROM - for evaluation  Fluid Color: Clear  Induction:   Augmentation:   Complications:   Cervical ripening:          Delivery:   Episiotomy:   Lacerations:   Repair suture:   Repair # of packets:   Blood loss (ml): 50   Information for the patient's newborn:  Aoki, [place] Girl Noami [[id number]]    Delivery 03/16/2019 3:18 PM by  Vaginal, Spontaneous Sex:  female Gestational Age: [redacted]w[redacted]d Delivery Clinician:   Living?:         APGARS  One minute Five minutes Ten minutes  Skin color:        Heart rate:        Grimace:        Muscle tone:        Breathing:        Totals: 8  9      Presentation/position:      Resuscitation:   Cord information:    Disposition of cord blood:     Blood gases sent?  Complications:   Placenta: Delivered:       appearance Newborn Measurements: Weight: 8 lb 3.6 oz (3730 g)  Height: 20.08"  Head circumference:    Chest circumference:    Other providers:    Additional  information: Forceps:   Vacuum:   Breech:   Observed anomalies       Delivery Note At 3:18 PM a viable and healthy female was delivered precipitously via Vaginal, Spontaneous (Presentation: Vertex;  ROA position).  APGAR: 8, 9; weight 3730 grams.  Infant delivered by Dani Gobble, CNM of [facility] who was present due to precipitous nature of delivery. I was present immediately after delivery of fetus. Placenta status: spontaneously removed, intact.  Cord:  with the following complications: none.  Cord pH: not obtained. Delayed cord clamping observed.   Anesthesia:  IV Stadol x 1, Epidural Episiotomy: None Lacerations: 1st degree Suture Repair: 3.0 vicryl Est. Blood Loss (mL): 50  Mom to postpartum.  Baby to Couplet care / Skin to Skin.  Rubie Maid, MD 03/16/2019, 4:11 PM

## 2018-09-19 ENCOUNTER — Encounter: Payer: Self-pay | Admitting: Obstetrics and Gynecology

## 2018-09-19 ENCOUNTER — Ambulatory Visit (INDEPENDENT_AMBULATORY_CARE_PROVIDER_SITE_OTHER): Payer: Medicaid Other

## 2018-09-19 ENCOUNTER — Ambulatory Visit (INDEPENDENT_AMBULATORY_CARE_PROVIDER_SITE_OTHER): Payer: Medicaid Other | Admitting: Obstetrics and Gynecology

## 2018-09-19 VITALS — BP 119/76 | HR 83 | Ht 63.0 in | Wt 167.1 lb

## 2018-09-19 DIAGNOSIS — Z113 Encounter for screening for infections with a predominantly sexual mode of transmission: Secondary | ICD-10-CM

## 2018-09-19 DIAGNOSIS — O09292 Supervision of pregnancy with other poor reproductive or obstetric history, second trimester: Secondary | ICD-10-CM

## 2018-09-19 DIAGNOSIS — O0930 Supervision of pregnancy with insufficient antenatal care, unspecified trimester: Secondary | ICD-10-CM

## 2018-09-19 DIAGNOSIS — O09299 Supervision of pregnancy with other poor reproductive or obstetric history, unspecified trimester: Secondary | ICD-10-CM | POA: Insufficient documentation

## 2018-09-19 DIAGNOSIS — Z3481 Encounter for supervision of other normal pregnancy, first trimester: Secondary | ICD-10-CM

## 2018-09-19 DIAGNOSIS — Z3A14 14 weeks gestation of pregnancy: Secondary | ICD-10-CM | POA: Diagnosis not present

## 2018-09-19 DIAGNOSIS — Z3687 Encounter for antenatal screening for uncertain dates: Secondary | ICD-10-CM | POA: Diagnosis not present

## 2018-09-19 DIAGNOSIS — O0932 Supervision of pregnancy with insufficient antenatal care, second trimester: Secondary | ICD-10-CM

## 2018-09-19 DIAGNOSIS — R110 Nausea: Secondary | ICD-10-CM | POA: Diagnosis not present

## 2018-09-19 DIAGNOSIS — Z0283 Encounter for blood-alcohol and blood-drug test: Secondary | ICD-10-CM

## 2018-09-19 LAB — POCT URINALYSIS DIPSTICK OB
Bilirubin, UA: NEGATIVE
Blood, UA: NEGATIVE
Glucose, UA: NEGATIVE
Ketones, UA: NEGATIVE
Nitrite, UA: NEGATIVE
Spec Grav, UA: >=1.03 — AB (ref 1.010–1.025)
Urobilinogen, UA: 0.2 E.U./dL
pH, UA: 6 (ref 5.0–8.0)

## 2018-09-19 MED ORDER — DOXYLAMINE-PYRIDOXINE 10-10 MG PO TBEC: 2.0000 | DELAYED_RELEASE_TABLET | Freq: Every day | ORAL | 5 refills | Status: DC

## 2018-09-19 NOTE — Progress Notes (Signed)
OBSTETRIC INITIAL PRENATAL VISIT  Subjective:    Madison Randall is being seen today for her first obstetrical visit.  This is not a planned pregnancy. She is a [id number] female at [redacted]w[redacted]d, with Estimated Date of Delivery: 03/18/19 by atient's last menstrual period of 06/11/2018.  She had confirmation of pregnancy at [facility]. Her obstetrical history is significant for history of PPH in prior pregnancy and history of fetal macrosomia. Relationship with FOB: spouse, living together. Patient does intend to breast feed. Pregnancy history fully reviewed.   OB History  Gravida Para Term Preterm AB Living  5 4 4  0 1 4  SAB TAB Ectopic Multiple Live Births  1 0 0 0 4    # Outcome Date GA Lbr Len/2nd Weight Sex Delivery Anes PTL Lv  5 Term 10/28/16 [redacted]w[redacted]d 02:32 / 00:21 7 lb 13.2 oz (3.55 kg) M Vag-Spont EPI  LIV     Name: Carlucci,BOY Naomii     Apgar1: 8  Apgar5: 9  4 SAB 12/02/15        ND  3 Term 08/21/14 [redacted]w[redacted]d  9 lb 4 oz (4.196 kg) F Vag-Spont  N LIV  2 Term 02/15/10 [redacted]w[redacted]d  8 lb 13 oz (3.997 kg) F Vag-Spont  N LIV  1 Term 12/10/07 [redacted]w[redacted]d  8 lb 4 oz (3.742 kg) M Vag-Spont   LIV    Gynecologic History:  Last pap smear was 04/2016.  Results were normal.  Denies h/o abnormal pap smears in the past.  Denies history of STIs.    Past Medical History:  Diagnosis Date  . Amenorrhea   . Migraine   . Migraines      Family History  Problem Relation Age of Onset  . Heart disease Father   . Alcohol abuse Father   . Breast cancer Maternal Grandmother   . Thyroid disease Maternal Grandmother   . Heart disease Maternal Grandfather   . Breast cancer Paternal Grandmother   . Breast cancer Other   . Other Mother        trigeminal neuralgia     Past Surgical History:  Procedure Laterality Date  . DILATION AND CURETTAGE OF UTERUS  09/2014 uterine infection form delivery 2015     Social History   Socioeconomic History  . Marital status: Married    Spouse name: Not on file  . Number of  children: Not on file  . Years of education: Not on file  . Highest education level: Not on file  Occupational History  . Not on file  Social Needs  . Financial resource strain: Not on file  . Food insecurity:    Worry: Not on file    Inability: Not on file  . Transportation needs:    Medical: Not on file    Non-medical: Not on file  Tobacco Use  . Smoking status: Former Games developer  . Smokeless tobacco: Never Used  Substance and Sexual Activity  . Alcohol use: No  . Drug use: No  . Sexual activity: Yes    Birth control/protection: None  Lifestyle  . Physical activity:    Days per week: Not on file    Minutes per session: Not on file  . Stress: Not on file  Relationships  . Social connections:    Talks on phone: Not on file    Gets together: Not on file    Attends religious service: Not on file    Active member of club or organization: Not on file  Attends meetings of clubs or organizations: Not on file    Relationship status: Not on file  . Intimate partner violence:    Fear of current or ex partner: Not on file    Emotionally abused: Not on file    Physically abused: Not on file    Forced sexual activity: Not on file  Other Topics Concern  . Not on file  Social History Narrative  . Not on file     Current Outpatient Medications on File Prior to Visit  Medication Sig Dispense Refill  . Multiple Vitamins-Minerals (IMMUNE SUPPORT VITAMIN C) PACK Take by mouth.     No current facility-administered medications on file prior to visit.      Allergies  Allergen Reactions  . Tdap [Tetanus-Diphth-Acell Pertussis] Anaphylaxis  . Imitrex [Sumatriptan] Other (See Comments)    syncope  . Topamax [Topiramate] Palpitations      Review of Systems General:Not Present- Fever, Weight Loss and Weight Gain. Skin:Not Present- Rash. HEENT:Not Present- Blurred Vision, Headache and Bleeding Gums.  Positive for sinus pressure, nasal congestion.  Respiratory:Not Present-  Difficulty Breathing. Breast:Not Present- Breast Mass. Cardiovascular:Not Present- Chest Pain, Elevated Blood Pressure, Fainting / Blacking Out and Shortness of Breath. Gastrointestinal:Not Present- Abdominal Pain, Constipation, Vomiting.   Positive for nausea Female Genitourinary:Not Present- Frequency, Painful Urination, Pelvic Pain, Vaginal Bleeding, Vaginal Discharge, Contractions, regular, Fetal Movements Decreased, Urinary Complaints and Vaginal Fluid. Musculoskeletal:Not Present- Back Pain and Leg Cramps. Neurological:Not Present- Dizziness. Psychiatric:Not Present- Depression.     Objective:   Blood pressure (!) 144/71, pulse 92, weight 167 lb 1.6 oz (75.8 kg), last menstrual period 06/11/2018, currently breastfeeding.  Body mass index is 29.6 kg/m.  Repeat BP 119/76.   General Appearance:    Alert, cooperative, no distress, appears stated age  Head:    Normocephalic, without obvious abnormality, atraumatic  Eyes:    PERRL, conjunctiva/corneas clear, EOM's intact, both eyes  Ears:    Normal external ear canals, both ears  Nose:   Nares normal, septum midline, mucosa normal, no drainage or sinus tenderness  Throat:   Lips, mucosa, and tongue normal; teeth and gums normal  Neck:   Supple, symmetrical, trachea midline, no adenopathy; thyroid: no enlargement/tenderness/nodules; no carotid bruit or JVD  Back:     Symmetric, no curvature, ROM normal, no CVA tenderness  Lungs:     Clear to auscultation bilaterally, respirations unlabored  Chest Wall:    No tenderness or deformity   Heart:    Regular rate and rhythm, S1 and S2 normal, no murmur, rub or gallop  Breast Exam:    No tenderness, masses, or nipple abnormality  Abdomen:     Soft, non-tender, bowel sounds active all four quadrants, no masses, no organomegaly.  FH 14.  FHT 152 bpm.  Genitalia:    Pelvic:external genitalia normal, vagina without lesions, discharge, or tenderness, rectovaginal septum  normal. Cervix normal  in appearance, no cervical motion tenderness, no adnexal masses or tenderness.  Pregnancy positive findings: uterine enlargement: 14 wk size, nontender.   Rectal:    Normal external sphincter.  No hemorrhoids appreciated. Internal exam not done.   Extremities:   Extremities normal, atraumatic, no cyanosis or edema  Pulses:   2+ and symmetric all extremities  Skin:   Skin color, texture, turgor normal, no rashes or lesions  Lymph nodes:   Cervical, supraclavicular, and axillary nodes normal  Neurologic:   CNII-XII intact, normal strength, sensation and reflexes throughout     Assessment:  Pregnancy at 14 and 2/7 weeks   Late prenatal care History of postpartum hemorrhage in prior pregnancy History of fetal macrosomia Elevated BP Nausea in pregnancy  Plan:    Initial labs ordered. Pap smear to be performed at 36 weeks or postpartum.  Prenatal vitamins encouraged. Problem list reviewed and updated. New OB counseling:  The patient has been given an overview regarding routine prenatal care.  Recommendations regarding diet, weight gain, and exercise in pregnancy were given. Prenatal testing, optional genetic testing, and ultrasound use in pregnancy were reviewed.  Declines all genetic testing.  Benefits of Breast Feeding were discussed. The patient is encouraged to consider nursing her baby post partum. History of postpartum hemorrhage in a prior pregnancy. Will closely monitor for anemia during pregnancy and be proactive at delivery.  H/o fetal macrosomia in a prior pregnancy.  Will check HgbA1c and if elevated, will perform early glucola.  Elevated BP with subsequent BP normal. Will continue to monitor.   Nausea in pregnancy, prescribed Diclegis.  Follow up in 4 weeks.  50% of 30 min visit spent on counseling and coordination of care.    The patient has Medicaid.  [facility] Medicaid Risk Screening Form completed today   Hildred Laserherry, Crystale Giannattasio, MD [facility]

## 2018-09-19 NOTE — Progress Notes (Signed)
Pt is present today for inital prenatal care and OB PE. Pt is currently [redacted]w[redacted]d. Pt stated that she is doing well and has no problems or concerns to report.

## 2018-09-19 NOTE — Patient Instructions (Signed)

## 2018-09-20 LAB — URINALYSIS, ROUTINE W REFLEX MICROSCOPIC
Bilirubin, UA: NEGATIVE
Glucose, UA: NEGATIVE
Ketones, UA: NEGATIVE
Nitrite, UA: NEGATIVE
PROTEIN UA: NEGATIVE
RBC, UA: NEGATIVE
Specific Gravity, UA: 1.01 (ref 1.005–1.030)
Urobilinogen, Ur: 0.2 mg/dL (ref 0.2–1.0)
pH, UA: 5.5 (ref 5.0–7.5)

## 2018-09-20 LAB — ABO AND RH: RH TYPE: POSITIVE

## 2018-09-20 LAB — MICROSCOPIC EXAMINATION
Casts: NONE SEEN /lpf
RBC, UA: NONE SEEN /hpf (ref 0–2)

## 2018-09-20 LAB — HEPATITIS B SURFACE ANTIGEN: Hepatitis B Surface Ag: NEGATIVE

## 2018-09-20 LAB — DRUG PROFILE, UR, 9 DRUGS (LABCORP)
Amphetamines, Urine: NEGATIVE ng/mL
Barbiturate Quant, Ur: NEGATIVE ng/mL
Benzodiazepine Quant, Ur: NEGATIVE ng/mL
CANNABINOID QUANT UR: NEGATIVE ng/mL
Cocaine (Metab.): NEGATIVE ng/mL
METHADONE SCREEN, URINE: NEGATIVE ng/mL
Opiate Quant, Ur: NEGATIVE ng/mL
PCP QUANT UR: NEGATIVE ng/mL
Propoxyphene: NEGATIVE ng/mL

## 2018-09-20 LAB — TOXOPLASMA ANTIBODIES- IGG AND  IGM
Toxoplasma Antibody- IgM: <3 AU/mL (ref 0.0–7.9)
Toxoplasma IgG Ratio: <3 IU/mL (ref 0.0–7.1)

## 2018-09-20 LAB — HEMOGLOBIN A1C
Est. average glucose Bld gHb Est-mCnc: 94 mg/dL
Hgb A1c MFr Bld: 4.9 % (ref 4.8–5.6)

## 2018-09-20 LAB — ANTIBODY SCREEN: Antibody Screen: NEGATIVE

## 2018-09-20 LAB — RPR: RPR Ser Ql: NONREACTIVE

## 2018-09-20 LAB — VARICELLA ZOSTER ANTIBODY, IGG: Varicella zoster IgG: 539 index (ref >165)

## 2018-09-20 LAB — HIV ANTIBODY (ROUTINE TESTING W REFLEX): HIV Screen 4th Generation wRfx: NONREACTIVE

## 2018-09-20 LAB — NICOTINE SCREEN, URINE: Cotinine Ql Scrn, Ur: NEGATIVE ng/mL

## 2018-09-20 LAB — RUBELLA SCREEN: RUBELLA: 2.2 {index} (ref >0.99)

## 2018-09-21 LAB — GC/CHLAMYDIA PROBE AMP
Chlamydia trachomatis, NAA: NEGATIVE
Neisseria gonorrhoeae by PCR: NEGATIVE

## 2018-09-21 LAB — CULTURE, OB URINE

## 2018-09-21 LAB — URINE CULTURE, OB REFLEX

## 2018-10-17 ENCOUNTER — Encounter: Payer: Self-pay | Admitting: Obstetrics and Gynecology

## 2018-10-17 ENCOUNTER — Ambulatory Visit (INDEPENDENT_AMBULATORY_CARE_PROVIDER_SITE_OTHER): Payer: Medicaid Other | Admitting: Obstetrics and Gynecology

## 2018-10-17 VITALS — BP 124/86 | HR 83 | Ht 63.0 in | Wt 170.9 lb

## 2018-10-17 DIAGNOSIS — Z3482 Encounter for supervision of other normal pregnancy, second trimester: Secondary | ICD-10-CM

## 2018-10-17 LAB — POCT URINALYSIS DIPSTICK OB
Bilirubin, UA: NEGATIVE
Blood, UA: NEGATIVE
Glucose, UA: NEGATIVE
Ketones, UA: NEGATIVE
LEUKOCYTES UA: NEGATIVE
Nitrite, UA: NEGATIVE
POC,PROTEIN,UA: NEGATIVE
SPEC GRAV UA: 1.01 (ref 1.010–1.025)
Urobilinogen, UA: 0.2 E.U./dL
pH, UA: 6.5 (ref 5.0–8.0)

## 2018-10-17 NOTE — Addendum Note (Signed)
Addended by: Elonda Husky on: 10/17/2018 04:09 PM   Modules accepted: Orders

## 2018-10-17 NOTE — Addendum Note (Signed)
Addended by: Dorian Pod on: 10/17/2018 04:10 PM   Modules accepted: Orders

## 2018-10-17 NOTE — Progress Notes (Signed)
Patient comes in today for ROB appointment. Patient states that she has been having belly button pain. She has also been  Having sinus pain and pressure. Patient states that she has been taking 4 Mucinex a day with no relief.

## 2018-10-17 NOTE — Progress Notes (Signed)
ROB: Declines quad screen.  Patient doing well without complaint.  No fetal movement yet.

## 2018-11-01 ENCOUNTER — Other Ambulatory Visit: Payer: Self-pay

## 2018-11-01 ENCOUNTER — Ambulatory Visit
Admission: RE | Admit: 2018-11-01 | Discharge: 2018-11-01 | Disposition: A | Discharge status: Home / Self Care | Payer: Medicaid Other | Source: Ambulatory Visit | Attending: Obstetrics and Gynecology | Admitting: Obstetrics and Gynecology

## 2018-11-01 DIAGNOSIS — Z3482 Encounter for supervision of other normal pregnancy, second trimester: Secondary | ICD-10-CM | POA: Diagnosis present

## 2018-11-14 ENCOUNTER — Encounter: Payer: Medicaid Other | Admitting: Obstetrics and Gynecology

## 2018-12-05 ENCOUNTER — Encounter: Payer: Self-pay | Admitting: Obstetrics and Gynecology

## 2018-12-05 ENCOUNTER — Ambulatory Visit (INDEPENDENT_AMBULATORY_CARE_PROVIDER_SITE_OTHER): Payer: Medicaid Other | Admitting: Obstetrics and Gynecology

## 2018-12-05 ENCOUNTER — Other Ambulatory Visit: Payer: Self-pay

## 2018-12-05 ENCOUNTER — Other Ambulatory Visit: Payer: Medicaid Other

## 2018-12-05 VITALS — BP 124/76 | HR 91 | Wt 175.5 lb

## 2018-12-05 DIAGNOSIS — Z130 Encounter for screening for diseases of the blood and blood-forming organs and certain disorders involving the immune mechanism: Secondary | ICD-10-CM

## 2018-12-05 DIAGNOSIS — Z131 Encounter for screening for diabetes mellitus: Secondary | ICD-10-CM

## 2018-12-05 DIAGNOSIS — Z3482 Encounter for supervision of other normal pregnancy, second trimester: Secondary | ICD-10-CM

## 2018-12-05 DIAGNOSIS — H00014 Hordeolum externum left upper eyelid: Secondary | ICD-10-CM

## 2018-12-05 LAB — POCT URINALYSIS DIPSTICK OB
Bilirubin, UA: NEGATIVE
Blood, UA: NEGATIVE
Glucose, UA: NEGATIVE
Ketones, UA: NEGATIVE
Nitrite, UA: NEGATIVE
POC,PROTEIN,UA: NEGATIVE
Spec Grav, UA: 1.025 (ref 1.010–1.025)
Urobilinogen, UA: 0.2 E.U./dL
pH, UA: 6 (ref 5.0–8.0)

## 2018-12-05 NOTE — Progress Notes (Signed)
ROB-Pt present today for prenatal care and 28 week labs. Pt stated that she was doing well. Pt stated having fetal movement present, light pressure and pain. No contractions or vaginal bleeding.

## 2018-12-05 NOTE — Progress Notes (Signed)
ROB: Patient doing well. Does note increased pelvic pressure. Can use pregnancy girdle. Also noting that she is getting more styes on her left eye during this pregnancy. For 28 week labs today.  Desires to breastfeed.  Unsure of desires for contraception, considering an IUD, however she notes that she and her husband are currently separated as of last week and so she may opt for abstinence if they are not back together by the end of the pregnancy. Cannot receive Tdap today due to allergies. Signed blood consent. Discussed COVID-19 and pregnancy.  Consider growth scan at 32-34 weeks due to history of fetal macrosomia in a prior pregnancy. RTC in 6 weeks.

## 2018-12-06 LAB — CBC
Hematocrit: 30.7 % — ABNORMAL LOW (ref 34.0–46.6)
Hemoglobin: 10.6 g/dL — ABNORMAL LOW (ref 11.1–15.9)
MCH: 29.5 pg (ref 26.6–33.0)
MCHC: 34.5 g/dL (ref 31.5–35.7)
MCV: 86 fL (ref 79–97)
Platelets: 275 10*3/uL (ref 150–450)
RBC: 3.59 x10E6/uL — ABNORMAL LOW (ref 3.77–5.28)
RDW: 12.4 % (ref 11.7–15.4)
WBC: 7.9 10*3/uL (ref 3.4–10.8)

## 2018-12-06 LAB — RPR: RPR Ser Ql: NONREACTIVE

## 2018-12-06 LAB — GLUCOSE, 1 HOUR GESTATIONAL: Gestational Diabetes Screen: 138 mg/dL (ref 65–139)

## 2018-12-07 MED ORDER — FERROUS SULFATE 325 (65 FE) MG PO TABS: 325.0000 mg | ORAL_TABLET | ORAL | 1 refills | Status: DC

## 2018-12-07 NOTE — Addendum Note (Signed)
Addended by: Fabian November on: 12/07/2018 03:24 PM   Modules accepted: Orders

## 2019-01-16 ENCOUNTER — Telehealth: Payer: Self-pay

## 2019-01-16 NOTE — Telephone Encounter (Signed)
Pt called no answer LMTRC to office to do prescreening.

## 2019-01-17 ENCOUNTER — Encounter: Payer: Self-pay | Admitting: Obstetrics and Gynecology

## 2019-01-17 ENCOUNTER — Other Ambulatory Visit: Payer: Self-pay

## 2019-01-17 ENCOUNTER — Ambulatory Visit (INDEPENDENT_AMBULATORY_CARE_PROVIDER_SITE_OTHER): Payer: Medicaid Other | Admitting: Obstetrics and Gynecology

## 2019-01-17 VITALS — BP 108/82 | HR 89 | Ht 63.0 in | Wt 176.3 lb

## 2019-01-17 DIAGNOSIS — Z3483 Encounter for supervision of other normal pregnancy, third trimester: Secondary | ICD-10-CM

## 2019-01-17 NOTE — Progress Notes (Signed)
Patient comes in today for ROB visit. Patient states that she is still having same problems that have been discussed in the past. Refused to give any specifics.

## 2019-01-17 NOTE — Progress Notes (Signed)
ROB: Patient without specific complaints but seems generally unhappy.  Very upset that she was asked to wear a mask.  Hemoglobin A1c today.

## 2019-01-18 LAB — HEMOGLOBIN A1C
Est. average glucose Bld gHb Est-mCnc: 105 mg/dL
Hgb A1c MFr Bld: 5.3 % (ref 4.8–5.6)

## 2019-02-04 ENCOUNTER — Telehealth: Payer: Self-pay

## 2019-02-04 NOTE — Progress Notes (Deleted)
ROB-Pt present today for prenatal care.

## 2019-02-04 NOTE — Telephone Encounter (Signed)
Pt called no answer LM to call the office for prescreening.  

## 2019-02-05 ENCOUNTER — Encounter: Payer: Medicaid Other | Admitting: Obstetrics and Gynecology

## 2019-03-01 ENCOUNTER — Encounter: Payer: Self-pay | Admitting: Surgical

## 2019-03-01 ENCOUNTER — Encounter: Payer: Medicaid Other | Admitting: Obstetrics and Gynecology

## 2019-03-04 ENCOUNTER — Telehealth: Payer: Self-pay

## 2019-03-04 NOTE — Telephone Encounter (Signed)
Pt called no answer LM to return call for prescreening/ sent mychart message.

## 2019-03-05 ENCOUNTER — Other Ambulatory Visit: Payer: Self-pay

## 2019-03-05 ENCOUNTER — Ambulatory Visit (INDEPENDENT_AMBULATORY_CARE_PROVIDER_SITE_OTHER): Payer: Medicaid Other | Admitting: Obstetrics and Gynecology

## 2019-03-05 ENCOUNTER — Other Ambulatory Visit (HOSPITAL_COMMUNITY)
Admission: RE | Admit: 2019-03-05 | Discharge: 2019-03-05 | Disposition: A | Discharge status: Home / Self Care | Payer: Medicaid Other | Source: Ambulatory Visit | Attending: Obstetrics and Gynecology | Admitting: Obstetrics and Gynecology

## 2019-03-05 ENCOUNTER — Encounter: Payer: Self-pay | Admitting: Obstetrics and Gynecology

## 2019-03-05 VITALS — BP 127/75 | HR 90 | Wt 179.5 lb

## 2019-03-05 DIAGNOSIS — Z3483 Encounter for supervision of other normal pregnancy, third trimester: Secondary | ICD-10-CM | POA: Diagnosis present

## 2019-03-05 DIAGNOSIS — H538 Other visual disturbances: Secondary | ICD-10-CM

## 2019-03-05 DIAGNOSIS — Z3685 Encounter for antenatal screening for Streptococcus B: Secondary | ICD-10-CM

## 2019-03-05 DIAGNOSIS — O99013 Anemia complicating pregnancy, third trimester: Secondary | ICD-10-CM

## 2019-03-05 DIAGNOSIS — N898 Other specified noninflammatory disorders of vagina: Secondary | ICD-10-CM | POA: Diagnosis present

## 2019-03-05 DIAGNOSIS — O26893 Other specified pregnancy related conditions, third trimester: Secondary | ICD-10-CM

## 2019-03-05 DIAGNOSIS — R55 Syncope and collapse: Secondary | ICD-10-CM

## 2019-03-05 DIAGNOSIS — O9989 Other specified diseases and conditions complicating pregnancy, childbirth and the puerperium: Secondary | ICD-10-CM

## 2019-03-05 LAB — POCT URINALYSIS DIPSTICK OB
Bilirubin, UA: NEGATIVE
Blood, UA: NEGATIVE
Glucose, UA: NEGATIVE
Ketones, UA: NEGATIVE
Nitrite, UA: NEGATIVE
POC,PROTEIN,UA: NEGATIVE
Spec Grav, UA: 1.01 (ref 1.010–1.025)
Urobilinogen, UA: 0.2 E.U./dL
pH, UA: 6.5 (ref 5.0–8.0)

## 2019-03-05 MED ORDER — TRAMADOL HCL 50 MG PO TABS: 50.0000 mg | ORAL_TABLET | Freq: Two times a day (BID) | ORAL | 0 refills | Status: DC | PRN

## 2019-03-05 NOTE — Progress Notes (Signed)
ROB-Pt is present for prenatal care. Pt stated that she was having a lot of pain throughout her body due to the pregnancy,

## 2019-03-05 NOTE — Progress Notes (Signed)
ROB: Patient complains of worsening pelvic/hip and shoulder pain to the point that she is not sleeping at night. States she gets about 2 hrs of sleep, finally drifting off at 5 or 6 a.m.  Has tried baths, heating pads, herbal remedies, and Tylenol with minimal improvement in symptoms. Feels miserable. Has diffuculty sitting during the day due to sciatic pain.  Discussed option of use of something stronger, such as Tramadol occasionally to help with pain. Patient ok with plan.  Patient has not been seen since 32 weeks of pregnancy. Had A1c secondary to borderline elevated 1 hr glucola and h/o prior macrosomia, was normal. 36 week genital cultures done today. Patient also reports episodes of feeling light-headed, weak, like her sugar is dropping, despite eating regular meals frequently. Will check random glucose (last ate at 0900) and TSH. Also with h/o anemia in pregnancy, will check CBC.  SHe has been noting vaginal discharge over the past week, will perform vaginitis panel with 36 week labs.Patient desires to discuss the possibility of elective induction at next visit or membrane stripping. RTC in 1 week.

## 2019-03-05 NOTE — Patient Instructions (Signed)

## 2019-03-06 LAB — TSH: TSH: 1.35 u[IU]/mL (ref 0.450–4.500)

## 2019-03-06 LAB — CBC
Hematocrit: 32.3 % — ABNORMAL LOW (ref 34.0–46.6)
Hemoglobin: 10.2 g/dL — ABNORMAL LOW (ref 11.1–15.9)
MCH: 24.6 pg — ABNORMAL LOW (ref 26.6–33.0)
MCHC: 31.6 g/dL (ref 31.5–35.7)
MCV: 78 fL — ABNORMAL LOW (ref 79–97)
Platelets: 257 10*3/uL (ref 150–450)
RBC: 4.14 x10E6/uL (ref 3.77–5.28)
RDW: 15 % (ref 11.7–15.4)
WBC: 8.4 10*3/uL (ref 3.4–10.8)

## 2019-03-06 LAB — GLUCOSE, RANDOM: Glucose: 83 mg/dL (ref 65–99)

## 2019-03-07 LAB — CERVICOVAGINAL ANCILLARY ONLY
Bacterial vaginitis: NEGATIVE
Candida vaginitis: POSITIVE — AB
Chlamydia: NEGATIVE
Neisseria Gonorrhea: NEGATIVE
Trichomonas: NEGATIVE

## 2019-03-08 LAB — STREP GP B NAA: Strep Gp B NAA: POSITIVE — AB

## 2019-03-13 ENCOUNTER — Ambulatory Visit (INDEPENDENT_AMBULATORY_CARE_PROVIDER_SITE_OTHER): Payer: Medicaid Other | Admitting: Obstetrics and Gynecology

## 2019-03-13 ENCOUNTER — Other Ambulatory Visit: Payer: Self-pay

## 2019-03-13 ENCOUNTER — Encounter: Payer: Self-pay | Admitting: Obstetrics and Gynecology

## 2019-03-13 VITALS — BP 126/82 | HR 92 | Wt 181.1 lb

## 2019-03-13 DIAGNOSIS — Z3A39 39 weeks gestation of pregnancy: Secondary | ICD-10-CM

## 2019-03-13 DIAGNOSIS — O09293 Supervision of pregnancy with other poor reproductive or obstetric history, third trimester: Secondary | ICD-10-CM

## 2019-03-13 DIAGNOSIS — Z3483 Encounter for supervision of other normal pregnancy, third trimester: Secondary | ICD-10-CM

## 2019-03-13 LAB — POCT URINALYSIS DIPSTICK OB
Bilirubin, UA: NEGATIVE
Blood, UA: NEGATIVE
Glucose, UA: NEGATIVE
Ketones, UA: NEGATIVE
Nitrite, UA: NEGATIVE
POC,PROTEIN,UA: NEGATIVE
Spec Grav, UA: <=1.005 — AB (ref 1.010–1.025)
Urobilinogen, UA: 0.2 E.U./dL
pH, UA: 6 (ref 5.0–8.0)

## 2019-03-13 MED ORDER — FLUCONAZOLE 150 MG PO TABS: 150.0000 mg | ORAL_TABLET | Freq: Once | ORAL | 1 refills | Status: AC

## 2019-03-13 NOTE — Patient Instructions (Signed)
Group B Streptococcus Infection During Pregnancy  Group B Streptococcus (GBS) is a type of bacteria (Streptococcus agalactiae) that is often found in healthy people, commonly in the rectum, vagina, and intestines. In people who are healthy and not pregnant, the bacteria rarely cause serious illness or complications. However, women who test positive for GBS during pregnancy can pass the bacteria to their baby during childbirth, which can cause serious infection in the baby after birth. Women with GBS may also have infections during their pregnancy or immediately after childbirth, such as urinary tract infections (UTIs) or infections of the uterus (uterine infections). Having GBS also increases a woman's risk of complications during pregnancy, such as early (preterm) labor or delivery, miscarriage, or stillbirth. Routine testing (screening) for GBS is recommended for all pregnant women. What increases the risk? You may have a higher risk for GBS infection during pregnancy if you had one during a past pregnancy. What are the signs or symptoms? In most cases, GBS infection does not cause symptoms in pregnant women. Signs and symptoms of a possible GBS-related infection may include:  Labor starting before the 37th week of pregnancy.  A UTI or bladder infection, which may cause: ? Fever. ? Pain or burning during urination. ? Frequent urination.  Fever during labor, along with: ? Bad-smelling discharge. ? Uterine tenderness. ? Rapid heartbeat in the mother, baby, or both. Rare but serious symptoms of a possible GBS-related infection in women include:  Blood infection (septicemia). This may cause fever, chills, or confusion.  Lung infection (pneumonia). This may cause fever, chills, cough, rapid breathing, difficulty breathing, or chest pain.  Bone, joint, skin, or soft tissue infection. How is this diagnosed? You may be screened for GBS between week 35 and week 37 of your pregnancy. If you have  symptoms of preterm labor, you may be screened earlier. This condition is diagnosed based on lab test results from:  A swab of fluid from the vagina and rectum.  A urine sample. How is this treated? This condition is treated with antibiotic medicine. When you go into labor, or as soon as your water breaks (your membranes rupture), you will be given antibiotics through an IV tube. Antibiotics will continue until after you give birth. If you are having a cesarean delivery, you do not need antibiotics unless your membranes have already ruptured. Follow these instructions at home:  Take over-the-counter and prescription medicines only as told by your health care provider.  Take your antibiotic medicine as told by your health care provider. Do not stop taking the antibiotic even if you start to feel better.  Keep all pre-birth (prenatal) visits and follow-up visits as told by your health care provider. This is important. Contact a health care provider if:  You have pain or burning when you urinate.  You have to urinate frequently.  You have a fever or chills.  You develop a bad-smelling vaginal discharge. Get help right away if:  Your membranes rupture.  You go into labor.  You have severe pain in your abdomen.  You have difficulty breathing.  You have chest pain. This information is not intended to replace advice given to you by your health care provider. Make sure you discuss any questions you have with your health care provider. Document Released: 11/15/2007 Document Revised: 11/29/2018 Document Reviewed: 03/03/2016 Elsevier Patient Education  2020 [employer]  

## 2019-03-13 NOTE — Progress Notes (Signed)
ROB: Patient still noting some vaginal discharge, was diagnosed with yeast infection on vaginal swabs, prescribed Diflucan today. Notes that her pelvic/hip pain has improved with Tramadol and has been able to sleep. Desires membrane sweeping, however cervix still unchanged. Given recipe for midwife brew, blue cohosh. RTC in 2 days for membrane sweeping.

## 2019-03-13 NOTE — Progress Notes (Signed)
ROB-Pt present for prenatal care. Pt stated having a lot of pain and pressure and an increased in discharge that is thick and watery.

## 2019-03-15 ENCOUNTER — Other Ambulatory Visit: Payer: Self-pay

## 2019-03-15 ENCOUNTER — Inpatient Hospital Stay
Admission: EM | Admit: 2019-03-15 | Discharge: 2019-03-17 | DRG: 807 | Disposition: A | Discharge status: Home / Self Care | Payer: Medicaid Other | Attending: Obstetrics and Gynecology | Admitting: Obstetrics and Gynecology

## 2019-03-15 ENCOUNTER — Encounter: Payer: Medicaid Other | Admitting: Obstetrics and Gynecology

## 2019-03-15 DIAGNOSIS — Z3480 Encounter for supervision of other normal pregnancy, unspecified trimester: Secondary | ICD-10-CM

## 2019-03-15 DIAGNOSIS — Z87891 Personal history of nicotine dependence: Secondary | ICD-10-CM

## 2019-03-15 DIAGNOSIS — O99824 Streptococcus B carrier state complicating childbirth: Secondary | ICD-10-CM | POA: Diagnosis not present

## 2019-03-15 DIAGNOSIS — O99013 Anemia complicating pregnancy, third trimester: Secondary | ICD-10-CM

## 2019-03-15 DIAGNOSIS — Z3A39 39 weeks gestation of pregnancy: Secondary | ICD-10-CM | POA: Diagnosis not present

## 2019-03-15 DIAGNOSIS — Z8759 Personal history of other complications of pregnancy, childbirth and the puerperium: Secondary | ICD-10-CM

## 2019-03-15 DIAGNOSIS — O09293 Supervision of pregnancy with other poor reproductive or obstetric history, third trimester: Secondary | ICD-10-CM

## 2019-03-15 DIAGNOSIS — O4212 Full-term premature rupture of membranes, onset of labor more than 24 hours following rupture: Secondary | ICD-10-CM

## 2019-03-15 DIAGNOSIS — O9902 Anemia complicating childbirth: Secondary | ICD-10-CM | POA: Diagnosis not present

## 2019-03-15 DIAGNOSIS — O4292 Full-term premature rupture of membranes, unspecified as to length of time between rupture and onset of labor: Secondary | ICD-10-CM | POA: Diagnosis present

## 2019-03-15 DIAGNOSIS — O99820 Streptococcus B carrier state complicating pregnancy: Secondary | ICD-10-CM

## 2019-03-15 DIAGNOSIS — O4290 Premature rupture of membranes, unspecified as to length of time between rupture and onset of labor, unspecified weeks of gestation: Secondary | ICD-10-CM | POA: Diagnosis present

## 2019-03-15 DIAGNOSIS — Z1159 Encounter for screening for other viral diseases: Secondary | ICD-10-CM | POA: Diagnosis not present

## 2019-03-15 DIAGNOSIS — Z3483 Encounter for supervision of other normal pregnancy, third trimester: Secondary | ICD-10-CM

## 2019-03-15 DIAGNOSIS — D649 Anemia, unspecified: Secondary | ICD-10-CM | POA: Diagnosis present

## 2019-03-15 DIAGNOSIS — O26893 Other specified pregnancy related conditions, third trimester: Secondary | ICD-10-CM | POA: Diagnosis present

## 2019-03-15 DIAGNOSIS — O09299 Supervision of pregnancy with other poor reproductive or obstetric history, unspecified trimester: Secondary | ICD-10-CM

## 2019-03-15 LAB — CBC
HCT: 33.3 % — ABNORMAL LOW (ref 36.0–46.0)
Hemoglobin: 10.4 g/dL — ABNORMAL LOW (ref 12.0–15.0)
MCH: 25.1 pg — ABNORMAL LOW (ref 26.0–34.0)
MCHC: 31.2 g/dL (ref 30.0–36.0)
MCV: 80.4 fL (ref 80.0–100.0)
Platelets: 251 10*3/uL (ref 150–400)
RBC: 4.14 MIL/uL (ref 3.87–5.11)
RDW: 18.1 % — ABNORMAL HIGH (ref 11.5–15.5)
WBC: 10.8 10*3/uL — ABNORMAL HIGH (ref 4.0–10.5)
nRBC: 0.2 % (ref 0.0–0.2)

## 2019-03-15 LAB — RAPID HIV SCREEN (HIV 1/2 AB+AG)
HIV 1/2 Antibodies: NONREACTIVE
HIV-1 P24 Antigen - HIV24: NONREACTIVE

## 2019-03-15 LAB — TYPE AND SCREEN
ABO/RH(D): A POS
Antibody Screen: NEGATIVE

## 2019-03-15 LAB — RUPTURE OF MEMBRANE (ROM)PLUS: Rom Plus: POSITIVE

## 2019-03-15 LAB — SARS CORONAVIRUS 2 BY RT PCR (HOSPITAL ORDER, PERFORMED IN ~~LOC~~ HOSPITAL LAB): SARS Coronavirus 2: NEGATIVE

## 2019-03-15 MED ORDER — ACETAMINOPHEN 325 MG PO TABS
650.0000 mg | ORAL_TABLET | ORAL | Status: DC | PRN
  Filled 2019-03-15 (×2): qty 2

## 2019-03-15 MED ORDER — LIDOCAINE HCL (PF) 1 % IJ SOLN: 30.0000 mL | INTRAMUSCULAR | Status: DC | PRN

## 2019-03-15 MED ORDER — OXYCODONE-ACETAMINOPHEN 5-325 MG PO TABS: 1.0000 | ORAL_TABLET | ORAL | Status: DC | PRN

## 2019-03-15 MED ORDER — BUTORPHANOL TARTRATE 1 MG/ML IJ SOLN
1.0000 mg | INTRAMUSCULAR | Status: DC | PRN
  Administered 2019-03-16: 13:00:00 1 mg via INTRAVENOUS
  Filled 2019-03-15: qty 1

## 2019-03-15 MED ORDER — OXYTOCIN BOLUS FROM INFUSION
500.0000 mL | Freq: Once | INTRAVENOUS | Status: AC
  Administered 2019-03-16: 500 mL via INTRAVENOUS

## 2019-03-15 MED ORDER — AMMONIA AROMATIC IN INHA
RESPIRATORY_TRACT | Status: AC
  Filled 2019-03-15: qty 10

## 2019-03-15 MED ORDER — TERBUTALINE SULFATE 1 MG/ML IJ SOLN: 0.2500 mg | Freq: Once | INTRAMUSCULAR | Status: DC | PRN

## 2019-03-15 MED ORDER — MISOPROSTOL 100 MCG PO TABS
50.0000 ug | ORAL_TABLET | ORAL | Status: DC | PRN
  Administered 2019-03-15 – 2019-03-16 (×3): 50 ug via ORAL
  Filled 2019-03-15 (×4): qty 1

## 2019-03-15 MED ORDER — SODIUM CHLORIDE 0.9 % IV SOLN
INTRAVENOUS | Status: AC
  Administered 2019-03-15: 17:00:00 2 g via INTRAVENOUS
  Filled 2019-03-15: qty 2000

## 2019-03-15 MED ORDER — LACTATED RINGERS IV SOLN: 500.0000 mL | INTRAVENOUS | Status: DC | PRN

## 2019-03-15 MED ORDER — OXYTOCIN 40 UNITS IN NORMAL SALINE INFUSION - SIMPLE MED
2.5000 [IU]/h | INTRAVENOUS | Status: DC
  Administered 2019-03-16: 2.5 [IU]/h via INTRAVENOUS
  Filled 2019-03-15 (×2): qty 1000

## 2019-03-15 MED ORDER — SOD CITRATE-CITRIC ACID 500-334 MG/5ML PO SOLN: 30.0000 mL | ORAL | Status: DC | PRN

## 2019-03-15 MED ORDER — SODIUM CHLORIDE 0.9 % IV SOLN
1.0000 g | INTRAVENOUS | Status: DC
  Administered 2019-03-15 – 2019-03-16 (×5): 1 g via INTRAVENOUS
  Filled 2019-03-15 (×11): qty 1000

## 2019-03-15 MED ORDER — MISOPROSTOL 200 MCG PO TABS
ORAL_TABLET | ORAL | Status: AC
  Filled 2019-03-15: qty 4

## 2019-03-15 MED ORDER — OXYCODONE-ACETAMINOPHEN 5-325 MG PO TABS: 2.0000 | ORAL_TABLET | ORAL | Status: DC | PRN

## 2019-03-15 MED ORDER — LACTATED RINGERS IV SOLN
INTRAVENOUS | Status: DC
  Administered 2019-03-15 – 2019-03-16 (×3): via INTRAVENOUS

## 2019-03-15 MED ORDER — OXYTOCIN 10 UNIT/ML IJ SOLN
INTRAMUSCULAR | Status: AC
  Filled 2019-03-15: qty 2

## 2019-03-15 MED ORDER — SODIUM CHLORIDE 0.9 % IV SOLN
2.0000 g | Freq: Once | INTRAVENOUS | Status: AC
  Administered 2019-03-15: 17:00:00 2 g via INTRAVENOUS

## 2019-03-15 MED ORDER — ONDANSETRON HCL 4 MG/2ML IJ SOLN: 4.0000 mg | Freq: Four times a day (QID) | INTRAMUSCULAR | Status: DC | PRN

## 2019-03-15 MED ORDER — LIDOCAINE HCL (PF) 1 % IJ SOLN
INTRAMUSCULAR | Status: AC
  Filled 2019-03-15: qty 30

## 2019-03-15 NOTE — Progress Notes (Signed)
Intrapartum Progress Note  S: Patient beginning to feel contractions.   O: Blood pressure 125/63, pulse 90, temperature 98.8 F (37.1 C), temperature source Axillary, resp. rate 16, height 5\' 3"  (1.6 m), weight 82.1 kg, last menstrual period 06/11/2018, currently breastfeeding. Gen App: NAD, mildly uncomfortable contractions Abdomen: soft, gravid FHT: baseline 155 bpm.  Accels present.  Decels absent. moderate in degree variability.   Tocometer: contractions q 2 minutes Cervix: 2.5/50/-3 Extremities: Nontender, no edema.  Labs:  No new labs.   Assessment:  1: SIUP at [redacted]w[redacted]d 2. PROM 3. GBS+   Plan:  1. Second dose of cytotec recently administered. Patient able to ambulate, use birthing ball.  2. Continue Ampicillin for GBS prophylaxis. Monitor for s/s of chorioamnionitis.  3. Anticipate vaginal delivery. Reassuring fetal tracing.   Rubie Maid, MD 03/15/2019 10:56 PM

## 2019-03-15 NOTE — OB Triage Note (Addendum)
Pt is a 33yo G6P4 at [redacted]w[redacted]d that presents from the ED with c/o LOF since around 1200 today. Pt states fluid was clear and she had to change pads twice before getting to the hospital. Pt states ctx all night last night that stopped around 0700 today. Pt denies VB and states positive FM.

## 2019-03-15 NOTE — H&P (Signed)
Obstetric History and Physical  Madison Randall is a 33 y.o. [id number] with IUP at 5589w4d presenting for coVinnie Levelmplaints of SROM since ~ noon today.  Reports changing 2 pads at home due to fluid leakage. Patient states she was having  regular contractions all night, however contractions stopped around 7:00 a.m.  Denies vaginal bleeding, ruptured, clear fluid membranes, with active fetal movement.    Prenatal Course Source of Care: [facility] with onset of care at 14 weeks Pregnancy complications or risks: Patient Active Problem List   Diagnosis Date Noted  . History of macrosomia in infant in prior pregnancy, currently pregnant 09/19/2018  . Plantar fasciitis, bilateral 07/19/2017  . Sebaceous cyst of right axilla 07/19/2017  . Neuralgia involving scalp 07/19/2017  . PROM (premature rupture of membranes) 10/27/2016  . History of postpartum hemorrhage 08/23/2016  . Upper respiratory tract infection 08/23/2016  . Overweight (BMI 25.0-29.9) 04/28/2016  . Supervision of normal intrauterine pregnancy in multigravida 04/28/2016  . Migraines 04/04/2016   She plans to breastfeed She desires undecided method for postpartum contraception.   Prenatal labs and studies: ABO, Rh: --/--/A POS (07/24 1634) Antibody: NEG (07/24 1634) Rubella: 2.20 (01/29 1115) RPR: Non Reactive (04/15 0939)  HBsAg: Negative (01/29 1115)  HIV: NON REACTIVE (07/24 1634)  JYN:WGNFAOZHGBS:Positive (07/15 0904) 1 hr Glucola normal (borderline) Genetic screening declined Anatomy US normal    Past Medical History:  Diagnosis Date  . Amenorrhea   . Migraine   . Migraines     Past Surgical History:  Procedure Laterality Date  . DILATION AND CURETTAGE OF UTERUS  09/2014 uterine infection form delivery 2015    OB History  Gravida Para Term Preterm AB Living  6 4 4   1 4   SAB TAB Ectopic Multiple Live Births  1     0 4    # Outcome Date GA Lbr Len/2nd Weight Sex Delivery Anes PTL Lv  6 Current           5 Term  10/28/16 5370w3d 02:32 / 00:21 3550 g M Vag-Spont EPI  LIV  4 SAB 12/02/15        ND  3 Term 08/21/14 451w2d  4196 g F Vag-Spont  N LIV  2 Term 02/15/10 6533w0d  3997 g F Vag-Spont  N LIV  1 Term 12/10/07 8655w3d  3742 g M Vag-Spont   LIV    Social History   Socioeconomic History  . Marital status: Married    Spouse name: Chanetta MarshallJimmy  . Number of children: Not on file  . Years of education: Not on file  . Highest education level: Not on file  Occupational History  . Not on file  Social Needs  . Financial resource strain: Not hard at all  . Food insecurity    Worry: Never true    Inability: Never true  . Transportation needs    Medical: No    Non-medical: No  Tobacco Use  . Smoking status: Former Games developermoker  . Smokeless tobacco: Never Used  Substance and Sexual Activity  . Alcohol use: No  . Drug use: No  . Sexual activity: Not Currently    Birth control/protection: None, I.U.D.  Lifestyle  . Physical activity    Days per week: 3 days    Minutes per session: 20 min  . Stress: Only a little  Relationships  . Social Musicianconnections    Talks on phone: Three times a week    Gets together: Three times a week  Attends religious service: More than 4 times per year    Active member of club or organization: Yes    Attends meetings of clubs or organizations: Never    Relationship status: Married  Other Topics Concern  . Not on file  Social History Narrative  . Not on file    Family History  Problem Relation Age of Onset  . Heart disease Father   . Alcohol abuse Father   . Breast cancer Maternal Grandmother   . Thyroid disease Maternal Grandmother   . Heart disease Maternal Grandfather   . Breast cancer Paternal Grandmother   . Breast cancer Other   . Other Mother        trigeminal neuralgia    Medications Prior to Admission  Medication Sig Dispense Refill Last Dose  . ferrous sulfate (FERROUSUL) 325 (65 FE) MG tablet Take 1 tablet (325 mg total) by mouth every other day. 60  tablet 1 03/14/2019 at Unknown time  . Multiple Vitamins-Minerals (IMMUNE SUPPORT VITAMIN C) PACK Take by mouth.   03/14/2019 at Unknown time  . traMADol (ULTRAM) 50 MG tablet Take 1 tablet (50 mg total) by mouth every 12 (twelve) hours as needed. 30 tablet 0 03/14/2019 at Unknown time    Allergies  Allergen Reactions  . Tdap [Tetanus-Diphth-Acell Pertussis] Anaphylaxis  . Imitrex [Sumatriptan] Other (See Comments)    syncope  . Topamax [Topiramate] Palpitations    Review of Systems: Negative except for what is mentioned in HPI.  Physical Exam: BP 132/89   Pulse (!) 104   Temp 98.6 F (37 C) (Oral)   Resp 15   Ht 5\' 3"  (1.6 m)   Wt 82.1 kg   LMP 06/11/2018   BMI 32.06 kg/m  CONSTITUTIONAL: Well-developed, well-nourished female in no acute distress.  HENT:  Normocephalic, atraumatic, External right and left ear normal. Oropharynx is clear and moist EYES: Conjunctivae and EOM are normal. Pupils are equal, round, and reactive to light. No scleral icterus.  NECK: Normal range of motion, supple, no masses SKIN: Skin is warm and dry. No rash noted. Not diaphoretic. No erythema. No pallor. NEUROLOGIC: Alert and oriented to person, place, and time. Normal reflexes, muscle tone coordination. No cranial nerve deficit noted. PSYCHIATRIC: Normal mood and affect. Normal behavior. Normal judgment and thought content. CARDIOVASCULAR: Normal heart rate noted, regular rhythm RESPIRATORY: Effort and breath sounds normal, no problems with respiration noted ABDOMEN: Soft, nontender, nondistended, gravid. MUSCULOSKELETAL: Normal range of motion. No edema and no tenderness. 2+ distal pulses.  Cervical Exam: Dilatation 1.5 cm   Effacement 40%   Station -3   Presentation: cephalic FHT:  Baseline rate 145 bpm   Variability moderate  Accelerations present   Decelerations none Contractions: None   Pertinent Labs/Studies:   Results for orders placed or performed during the hospital encounter of 03/15/19  (from the past 24 hour(s))  ROM Plus ([facility] only)     Status: None   Collection Time: 03/15/19  3:09 PM  Result Value Ref Range   Rom Plus POSITIVE   CBC     Status: Abnormal   Collection Time: 03/15/19  4:34 PM  Result Value Ref Range   WBC 10.8 (H) 4.0 - 10.5 K/uL   RBC 4.14 3.87 - 5.11 MIL/uL   Hemoglobin 10.4 (L) 12.0 - 15.0 g/dL   HCT 33.3 (L) 36.0 - 46.0 %   MCV 80.4 80.0 - 100.0 fL   MCH 25.1 (L) 26.0 - 34.0 pg   MCHC 31.2 30.0 -  36.0 g/dL   RDW 16.118.1 (H) 09.611.5 - 04.515.5 %   Platelets 251 150 - 400 K/uL   nRBC 0.2 0.0 - 0.2 %  Type and screen [facility]     Status: None   Collection Time: 03/15/19  4:34 PM  Result Value Ref Range   ABO/RH(D) A POS    Antibody Screen NEG    Sample Expiration      03/18/2019,2359 Performed at [facility], [address] [place], [place] [address]   Rapid HIV screen (HIV 1/2 Ab+Ag) ([facility] Only)     Status: None   Collection Time: 03/15/19  4:34 PM  Result Value Ref Range   HIV-1 P24 Antigen - HIV24 NON REACTIVE NON REACTIVE   HIV 1/2 Antibodies NON REACTIVE NON REACTIVE   Interpretation (HIV Ag Ab)      A non reactive test result means that HIV 1 or HIV 2 antibodies and HIV 1 p24 antigen were not detected in the specimen.  SARS Coronavirus 2 (CEPHEID - [facility]formed [facility]), Hosp Order     Status: None   Collection Time: 03/15/19  4:52 PM   Specimen: Nasopharyngeal Swab  Result Value Ref Range   SARS Coronavirus 2 NEGATIVE NEGATIVE    Assessment : Madison Randall is a 33 y.o. [id number] at 7135w4d being admitted for induction of labor due to PROM.  GBS positive.  Mild anemia of pregnancy.  History of postpartum hemorrhage in prior pregnancy.   Plan: Labor: Induction with Cytotec as ordered as per protocol. Analgesia as needed. Patient notes she will desire an epidural. Mild anemia of pregnancy.  FWB: Reassuring fetal heart tracing.  GBS positive.  Ampicillin ordered per protocol.  Delivery  plan: Hopeful for vaginal delivery    Hildred Laserherry, Jniyah Dantuono, MD [facility]

## 2019-03-16 ENCOUNTER — Encounter: Payer: Self-pay | Admitting: Anesthesiology

## 2019-03-16 ENCOUNTER — Inpatient Hospital Stay: Payer: Medicaid Other | Admitting: Anesthesiology

## 2019-03-16 DIAGNOSIS — O4212 Full-term premature rupture of membranes, onset of labor more than 24 hours following rupture: Secondary | ICD-10-CM

## 2019-03-16 DIAGNOSIS — O99013 Anemia complicating pregnancy, third trimester: Secondary | ICD-10-CM | POA: Diagnosis present

## 2019-03-16 DIAGNOSIS — O99824 Streptococcus B carrier state complicating childbirth: Secondary | ICD-10-CM

## 2019-03-16 DIAGNOSIS — O9902 Anemia complicating childbirth: Secondary | ICD-10-CM

## 2019-03-16 LAB — RPR: RPR Ser Ql: NONREACTIVE

## 2019-03-16 MED ORDER — PHENYLEPHRINE 40 MCG/ML (10ML) SYRINGE FOR IV PUSH (FOR BLOOD PRESSURE SUPPORT)
80.0000 ug | PREFILLED_SYRINGE | INTRAVENOUS | Status: DC | PRN
  Filled 2019-03-16: qty 10

## 2019-03-16 MED ORDER — IBUPROFEN 800 MG PO TABS
800.0000 mg | ORAL_TABLET | Freq: Four times a day (QID) | ORAL | Status: DC
  Administered 2019-03-16 – 2019-03-17 (×4): 800 mg via ORAL
  Filled 2019-03-16 (×4): qty 1

## 2019-03-16 MED ORDER — SENNOSIDES-DOCUSATE SODIUM 8.6-50 MG PO TABS: 2.0000 | ORAL_TABLET | ORAL | Status: DC

## 2019-03-16 MED ORDER — BENZOCAINE-MENTHOL 20-0.5 % EX AERO
1.0000 "application " | INHALATION_SPRAY | CUTANEOUS | Status: DC | PRN
  Administered 2019-03-16: 1 via TOPICAL
  Filled 2019-03-16: qty 56

## 2019-03-16 MED ORDER — ZOLPIDEM TARTRATE 5 MG PO TABS: 5.0000 mg | ORAL_TABLET | Freq: Every evening | ORAL | Status: DC | PRN

## 2019-03-16 MED ORDER — DIPHENHYDRAMINE HCL 50 MG/ML IJ SOLN: 12.5000 mg | INTRAMUSCULAR | Status: DC | PRN

## 2019-03-16 MED ORDER — PRENATAL MULTIVITAMIN CH: 1.0000 | ORAL_TABLET | Freq: Every day | ORAL | Status: DC

## 2019-03-16 MED ORDER — EPHEDRINE 5 MG/ML INJ
10.0000 mg | INTRAVENOUS | Status: DC | PRN
  Filled 2019-03-16: qty 2

## 2019-03-16 MED ORDER — ACETAMINOPHEN 325 MG PO TABS
650.0000 mg | ORAL_TABLET | ORAL | Status: DC | PRN
  Administered 2019-03-16 – 2019-03-17 (×2): 650 mg via ORAL

## 2019-03-16 MED ORDER — SIMETHICONE 80 MG PO CHEW: 80.0000 mg | CHEWABLE_TABLET | ORAL | Status: DC | PRN

## 2019-03-16 MED ORDER — LIDOCAINE-EPINEPHRINE (PF) 1.5 %-1:200000 IJ SOLN
INTRAMUSCULAR | Status: DC | PRN
  Administered 2019-03-16: 3 mL via EPIDURAL

## 2019-03-16 MED ORDER — FENTANYL 2.5 MCG/ML W/ROPIVACAINE 0.15% IN NS 100 ML EPIDURAL (ARMC)
EPIDURAL | Status: AC
  Filled 2019-03-16: qty 100

## 2019-03-16 MED ORDER — WITCH HAZEL-GLYCERIN EX PADS: 1.0000 "application " | MEDICATED_PAD | CUTANEOUS | Status: DC | PRN

## 2019-03-16 MED ORDER — DIPHENHYDRAMINE HCL 25 MG PO CAPS: 25.0000 mg | ORAL_CAPSULE | Freq: Four times a day (QID) | ORAL | Status: DC | PRN

## 2019-03-16 MED ORDER — LACTATED RINGERS IV SOLN
500.0000 mL | Freq: Once | INTRAVENOUS | Status: AC
  Administered 2019-03-16: 14:00:00 500 mL via INTRAVENOUS

## 2019-03-16 MED ORDER — ONDANSETRON HCL 4 MG/2ML IJ SOLN: 4.0000 mg | INTRAMUSCULAR | Status: DC | PRN

## 2019-03-16 MED ORDER — SODIUM CHLORIDE 0.9 % IV SOLN
INTRAVENOUS | Status: DC | PRN
  Administered 2019-03-16 (×2): 5 mL via EPIDURAL

## 2019-03-16 MED ORDER — COCONUT OIL OIL: 1.0000 "application " | TOPICAL_OIL | Status: DC | PRN

## 2019-03-16 MED ORDER — ONDANSETRON HCL 4 MG PO TABS: 4.0000 mg | ORAL_TABLET | ORAL | Status: DC | PRN

## 2019-03-16 MED ORDER — METHYLERGONOVINE MALEATE 0.2 MG/ML IJ SOLN
INTRAMUSCULAR | Status: AC
  Filled 2019-03-16: qty 1

## 2019-03-16 MED ORDER — MISOPROSTOL 200 MCG PO TABS
800.0000 ug | ORAL_TABLET | Freq: Once | ORAL | Status: AC
  Administered 2019-03-16: 18:00:00 800 ug via RECTAL
  Filled 2019-03-16: qty 4

## 2019-03-16 MED ORDER — OXYTOCIN 40 UNITS IN NORMAL SALINE INFUSION - SIMPLE MED
1.0000 m[IU]/min | INTRAVENOUS | Status: DC
  Administered 2019-03-16: 06:00:00 4 m[IU]/min via INTRAVENOUS

## 2019-03-16 MED ORDER — LIDOCAINE HCL (PF) 1 % IJ SOLN
INTRAMUSCULAR | Status: DC | PRN
  Administered 2019-03-16 (×3): 1 mL via INTRADERMAL

## 2019-03-16 MED ORDER — FENTANYL 2.5 MCG/ML W/ROPIVACAINE 0.15% IN NS 100 ML EPIDURAL (ARMC)
12.0000 mL/h | EPIDURAL | Status: DC
  Administered 2019-03-16: 12 mL/h via EPIDURAL

## 2019-03-16 MED ORDER — DIBUCAINE (PERIANAL) 1 % EX OINT: 1.0000 "application " | TOPICAL_OINTMENT | CUTANEOUS | Status: DC | PRN

## 2019-03-16 MED ORDER — CARBOPROST TROMETHAMINE 250 MCG/ML IM SOLN
INTRAMUSCULAR | Status: AC
  Filled 2019-03-16: qty 1

## 2019-03-16 NOTE — Progress Notes (Signed)
Intrapartum Progress Note  S: Patient recently s/p epidural placement. Feeling comfortable. Reports earlier that she felt a period of intense fetL movement, then heard a "pop", followed bu a gush of fluid and intense contractions    O: Blood pressure 127/76, pulse 100, temperature 98.1 F (36.7 C), temperature source Oral, resp. rate 18, height 5\' 3"  (1.6 m), weight 82.1 kg, last menstrual period 06/11/2018, SpO2 93 %, currently breastfeeding. Gen App: NAD, no distress Abdomen: soft, gravid FHT: baseline 140 bpm.  Accels present.  Decels present - x 1  with patient lying flat on her back after epidural . moderate in degree variability.   Tocometer: contractions q 5-6 minutes Cervix: 3.5/60/-3  Extremities: Nontender, no edema.   Pitocin: 16 mIU.  Just increased after epidural.   Labs:  No new labs.   Assessment:  1: SIUP at [redacted]w[redacted]d 2. PROM 3. GBS+   Plan:  1. Continue Pitocin for augmentation.  2. Continue Ampicillin for GBS prophylaxis. Adequately treated. Monitor for s/s of chorioamnionitis.  3. Anticipate vaginal delivery.   Rubie Maid, MD 03/16/2019 2:50 PM

## 2019-03-16 NOTE — Anesthesia Procedure Notes (Signed)
Epidural Patient location during procedure: OB Start time: 03/16/2019 1:40 PM End time: 03/16/2019 1:50 PM  Staffing Anesthesiologist: Kyiah Canepa, Precious Haws, MD Performed: anesthesiologist   Preanesthetic Checklist Completed: patient identified, site marked, surgical consent, pre-op evaluation, timeout performed, IV checked, risks and benefits discussed and monitors and equipment checked  Epidural Patient position: sitting Prep: ChloraPrep Patient monitoring: heart rate, continuous pulse ox and blood pressure Approach: midline Location: L3-L4 Injection technique: LOR saline  Needle:  Needle type: Tuohy  Needle gauge: 17 G Needle length: 9 cm and 9 Needle insertion depth: 5 cm Catheter type: closed end flexible Catheter size: 19 Gauge Catheter at skin depth: 10 cm Test dose: negative and 1.5% lidocaine with Epi 1:200 K  Assessment Sensory level: T10 Events: blood not aspirated, injection not painful, no injection resistance, negative IV test and no paresthesia  Additional Notes 3 attempts Patient has prominent scoliosis  Pt. Evaluated and documentation done after procedure finished. Patient identified. Risks/Benefits/Options discussed with patient including but not limited to bleeding, infection, nerve damage, paralysis, failed block, incomplete pain control, headache, blood pressure changes, nausea, vomiting, reactions to medication both or allergic, itching and postpartum back pain. Confirmed with bedside nurse the patient's most recent platelet count. Confirmed with patient that they are not currently taking any anticoagulation, have any bleeding history or any family history of bleeding disorders. Patient expressed understanding and wished to proceed. All questions were answered. Sterile technique was used throughout the entire procedure. Please see nursing notes for vital signs. Test dose was given through epidural catheter and negative prior to continuing to dose epidural or  start infusion. Warning signs of high block given to the patient including shortness of breath, tingling/numbness in hands, complete motor block, or any concerning symptoms with instructions to call for help. Patient was given instructions on fall risk and not to get out of bed. All questions and concerns addressed with instructions to call with any issues or inadequate analgesia.   Patient tolerated the insertion well without immediate complications.Reason for block:procedure for pain

## 2019-03-16 NOTE — Anesthesia Preprocedure Evaluation (Signed)
Anesthesia Evaluation  Patient identified by MRN, date of birth, ID band Patient awake    Reviewed: Allergy & Precautions, H&P , NPO status , Patient's Chart, lab work & pertinent test results  History of Anesthesia Complications Negative for: history of anesthetic complications  Airway Mallampati: III  TM Distance: >3 FB Neck ROM: full    Dental  (+) Chipped   Pulmonary neg shortness of breath, former smoker,           Cardiovascular Exercise Tolerance: Good (-) hypertensionnegative cardio ROS       Neuro/Psych  Headaches,    GI/Hepatic negative GI ROS,   Endo/Other    Renal/GU   negative genitourinary   Musculoskeletal   Abdominal   Peds  Hematology negative hematology ROS (+)   Anesthesia Other Findings Past Medical History: No date: Amenorrhea No date: Migraine No date: Migraines Scoliosis   Past Surgical History: 09/2014 uterine infection form delivery 2015: DILATION AND CURETTAGE  OF UTERUS  BMI    Body Mass Index: 32.06 kg/m      Reproductive/Obstetrics (+) Pregnancy                             Anesthesia Physical Anesthesia Plan  ASA: II  Anesthesia Plan: Epidural   Post-op Pain Management:    Induction:   PONV Risk Score and Plan:   Airway Management Planned:   Additional Equipment:   Intra-op Plan:   Post-operative Plan:   Informed Consent: I have reviewed the patients History and Physical, chart, labs and discussed the procedure including the risks, benefits and alternatives for the proposed anesthesia with the patient or authorized representative who has indicated his/her understanding and acceptance.       Plan Discussed with: Anesthesiologist  Anesthesia Plan Comments:         Anesthesia Quick Evaluation

## 2019-03-16 NOTE — Progress Notes (Signed)
Intrapartum Progress Note  S: Patient desires to ambulate. Not really feeling contractions yet  O: Blood pressure 125/63, pulse 90, temperature 98.3 F (36.8 C), temperature source Oral, resp. rate 16, height 5\' 3"  (1.6 m), weight 82.1 kg, last menstrual period 06/11/2018, currently breastfeeding. Gen App: NAD, no distress Abdomen: soft, gravid FHT: baseline 145 bpm.  Accels present.  Decels absent. moderate in degree variability.   Tocometer: contractions q 4-5 minutes Cervix: deferred as patient with no signs of active labor Extremities: Nontender, no edema.   Pitocin: 12 mIU  Labs:  No new labs.   Assessment:  1: SIUP at [redacted]w[redacted]d 2. PROM 3. GBS+   Plan:  1. Continue Pitocin for augmentation. Can ambulate. Also encouraged to utilize [employer].  2. Continue Ampicillin for GBS prophylaxis. Adequately treated. Monitor for s/s of chorioamnionitis.  3. Anticipate vaginal delivery. Reassuring fetal tracing.   Rubie Maid, MD 03/16/2019 9:22 AM

## 2019-03-16 NOTE — Progress Notes (Signed)
Intrapartum Progress Note  S: Patient feeling some discomfort with contractions.   O: Blood pressure 125/63, pulse 90, temperature 98.8 F (37.1 C), temperature source Axillary, resp. rate 16, height 5\' 3"  (1.6 m), weight 82.1 kg, last menstrual period 06/11/2018, currently breastfeeding. Gen App: NAD, mildly uncomfortable contractions Abdomen: soft, gravid FHT: baseline 160 bpm.  Accels present.  Decels absent. moderate in degree variability.   Tocometer: contractions q 2 minutes Cervix: 2.5/50/-3 Extremities: Nontender, no edema.  Labs:  No new labs.   Assessment:  1: SIUP at [redacted]w[redacted]d 2. PROM 3. GBS+   Plan:  1. Third dose of cytotec to be administered.  2. Continue Ampicillin for GBS prophylaxis. Monitor for s/s of chorioamnionitis.  3. Anticipate vaginal delivery. Reassuring fetal tracing.   Rubie Maid, MD 03/16/2019 1:50 AM

## 2019-03-16 NOTE — Lactation Note (Signed)
This note was copied from a baby's chart. Lactation Consultation Note  Patient Name: Madison Randall [id number] Date: 03/16/2019 Reason for consult: Initial assessment;Term  When went in mom's room in [place], mom had Hazel skin to skin and she was sucking on her thumb.  Assisted mom with pillow support in comfortable position in cradle hold.  She was rooting with wide open mouth and latched with minimal assistance and began strong, rhythmic sucking with audible swallows.  Mom only breast fed first baby for 6 weeks reporting her milk dried up.  She breast fed the second for 1 year.  Mom breast fed 3rd for a year also, but had several bouts of thrush that she had hard time getting rid of according to mom.  Her last baby she breast fed for 16 months.  Reminded of newborn feeding cues and encouraged mom to put [facility] to the breast whenever she demonstrated hunger cues.  Reviewed supply and demand, normal course of lactation, newborn stomach size and routine newborn feeding patterns.    Maternal Data Formula Feeding for Exclusion: No Has patient been taught Hand Expression?: Yes Does the patient have breastfeeding experience prior to this delivery?: Yes  Feeding Feeding Type: Breast Fed  LATCH Score Latch: Grasps breast easily, tongue down, lips flanged, rhythmical sucking.  Audible Swallowing: A few with stimulation  Type of Nipple: Everted at rest and after stimulation  Comfort (Breast/Nipple): Soft / non-tender  Hold (Positioning): Assistance needed to correctly position infant at breast and maintain latch.  LATCH Score: 8  Interventions Interventions: Breast feeding basics reviewed;Assisted with latch;Skin to skin;Breast massage;Hand express;Reverse pressure;Breast compression;Adjust position;Support pillows;Position options  Lactation Tools Discussed/Used WIC Program: Yes   Consult Status Consult Status: PRN    Madison Randall 03/16/2019, 4:45 PM

## 2019-03-16 NOTE — Progress Notes (Signed)
Intrapartum Progress Note  S: Patient resting.   O: Blood pressure 125/63, pulse 90, temperature 97.9 F (36.6 C), temperature source Oral, resp. rate 16, height 5\' 3"  (1.6 m), weight 82.1 kg, last menstrual period 06/11/2018, currently breastfeeding. Gen App: NAD, no distress Abdomen: soft, gravid FHT: baseline 140 bpm.  Accels present.  Decels absent. moderate in degree variability.   Tocometer: contractions occasional Cervix: 3/50-60/-3 Extremities: Nontender, no edema.  Labs:  No new labs.   Assessment:  1: SIUP at [redacted]w[redacted]d 2. PROM 3. GBS+   Plan:  1. Will start on Pitocin for augmentation. No modest cervical change with last 2 doses of Cytotec. 2. Continue Ampicillin for GBS prophylaxis. Adequately treated. Monitor for s/s of chorioamnionitis.  3. Anticipate vaginal delivery. Reassuring fetal tracing.   Rubie Maid, MD 03/16/2019 6:14 AM

## 2019-03-17 ENCOUNTER — Ambulatory Visit: Payer: Self-pay

## 2019-03-17 LAB — CBC
HCT: 24.4 % — ABNORMAL LOW (ref 36.0–46.0)
Hemoglobin: 7.6 g/dL — ABNORMAL LOW (ref 12.0–15.0)
MCH: 25.4 pg — ABNORMAL LOW (ref 26.0–34.0)
MCHC: 31.1 g/dL (ref 30.0–36.0)
MCV: 81.6 fL (ref 80.0–100.0)
Platelets: 216 10*3/uL (ref 150–400)
RBC: 2.99 MIL/uL — ABNORMAL LOW (ref 3.87–5.11)
RDW: 18.8 % — ABNORMAL HIGH (ref 11.5–15.5)
WBC: 12.3 10*3/uL — ABNORMAL HIGH (ref 4.0–10.5)
nRBC: 0 % (ref 0.0–0.2)

## 2019-03-17 MED ORDER — DOCUSATE SODIUM 100 MG PO CAPS: 100.0000 mg | ORAL_CAPSULE | Freq: Two times a day (BID) | ORAL | 2 refills | Status: DC | PRN

## 2019-03-17 MED ORDER — IBUPROFEN 800 MG PO TABS: 800.0000 mg | ORAL_TABLET | Freq: Four times a day (QID) | ORAL | 0 refills | Status: DC

## 2019-03-17 NOTE — Discharge Summary (Signed)
OB Discharge Summary     Patient Name: Madison Randall DOB: 09/21/1985 MRN: [id number]  Date of admission: 03/15/2019 Delivering Madison LevelMD: Madison LaserHERRY, Madison Bessey   Date of discharge: 03/17/2019  Admitting diagnosis: leaking fluid 39 wks preg Intrauterine pregnancy: 151w5d     Secondary diagnosis:  Active Problems:   Supervision of normal intrauterine pregnancy in multigravida   History of postpartum hemorrhage   PROM (premature rupture of membranes)   History of macrosomia in infant in prior pregnancy, currently pregnant   Anemia of pregnancy in third trimester  Additional problems: None     Discharge diagnosis: Term Pregnancy Delivered and Anemia                                                                                                Post partum procedures:None  Augmentation: Pitocin and Cytotec  Complications: None  Hospital course:  Onset of Labor With Vaginal Delivery     33 y.o. yo [id number] at 10551w5d was admitted for PROM on 03/15/2019. Patient had an uncomplicated labor course as follows:  Membrane Rupture Time/Date: 12:00 PM ,03/15/2019   Intrapartum Procedures: Episiotomy: None [1]                                         Lacerations:  1st degree [2]  Patient had a delivery of a Viable infant. 03/16/2019  Information for the patient's newborn:  Madison Randall, Girl Madison Randall [[id number]][[id number]]  Delivery Method: Vag-Spont     Pateint had an uncomplicated postpartum course.  She is ambulating, tolerating a regular diet, passing flatus, and urinating well. Patient is discharged home in stable condition on 03/17/19.   Physical exam  Vitals:   03/16/19 1944 03/16/19 2337 03/17/19 0320 03/17/19 0810  BP: (!) 145/75 (!) 145/77 122/71 120/67  Pulse: (!) 109 89 80 80  Resp: 18 18 20 20   Temp: 99 F (37.2 C) 98.6 F (37 C) (!) 97.5 F (36.4 C) 97.6 F (36.4 C)  TempSrc: Oral Oral Oral Oral  SpO2: 100% 99% 100% 100%  Weight:      Height:       General: alert, cooperative and no  distress Lochia: appropriate Uterine Fundus: firm Incision: N/A DVT Evaluation: No evidence of DVT seen on physical exam. Negative Homan's sign. No cords or calf tenderness. No significant calf/ankle edema. Labs: Lab Results  Component Value Date   WBC 12.3 (H) 03/17/2019   HGB 7.6 (L) 03/17/2019   HCT 24.4 (L) 03/17/2019   MCV 81.6 03/17/2019   PLT 216 03/17/2019   CMP Latest Ref Rng & Units 03/05/2019  Glucose 65 - 99 mg/dL 83  BUN 7 - 18 mg/dL -  Creatinine 1.300.60 - 8.651.30 mg/dL -  Sodium 784136 - 696145 mmol/L -  Potassium 3.5 - 5.1 mmol/L -  Chloride 98 - 107 mmol/L -  CO2 21 - 32 mmol/L -  Calcium 8.7 - 10.2 mg/dL -  AST 15 - 37 Unit/L -    Discharge instruction: per After Visit Summary  and "Baby and Me Booklet".  After visit meds:  Allergies as of 03/17/2019      Reactions   Tdap [tetanus-diphth-acell Pertussis] Anaphylaxis   Imitrex [sumatriptan] Other (See Comments)   syncope   Topamax [topiramate] Palpitations      Medication List    STOP taking these medications   traMADol 50 MG tablet Commonly known as: ULTRAM     TAKE these medications   docusate sodium 100 MG capsule Commonly known as: COLACE Take 1 capsule (100 mg total) by mouth 2 (two) times daily as needed.   ferrous sulfate 325 (65 FE) MG tablet Commonly known as: FerrouSul Take 1 tablet (325 mg total) by mouth every other day.   ibuprofen 800 MG tablet Commonly known as: ADVIL Take 1 tablet (800 mg total) by mouth every 6 (six) hours.   Immune Support Vitamin C Pack Take by mouth.       Diet: routine diet  Activity: Advance as tolerated. Pelvic rest for 6 weeks.   Outpatient follow up:6 weeks Follow up Appt:No future appointments. Follow up Visit:No follow-ups on file.  Postpartum contraception: Vasectomy  Newborn Data: Live born female  Birth Weight: 8 lb 3.6 oz (3730 g) APGAR: 24, 9  Newborn Delivery   Birth date/time: 03/16/2019 15:18:00 Delivery type: Vaginal, Spontaneous       Baby Feeding: Breast Disposition:home with mother   03/17/2019 Rubie Maid, MD

## 2019-03-17 NOTE — Discharge Instructions (Signed)

## 2019-03-17 NOTE — Progress Notes (Signed)
Post Partum Day # 1, s/p SVD  Subjective: no complaints, up ad lib, voiding and tolerating PO  Objective: Temp:  [97.5 F (36.4 C)-99 F (37.2 C)] 97.6 F (36.4 C) (07/26 0810) Pulse Rate:  [69-161] 80 (07/26 0810) Resp:  [18-20] 20 (07/26 0810) BP: (87-145)/(38-90) 120/67 (07/26 0810) SpO2:  [91 %-100 %] 100 % (07/26 0810)  Physical Exam:  General: alert and no distress  Lungs: clear to auscultation bilaterally Breasts: normal appearance, no masses or tenderness Heart: regular rate and rhythm, S1, S2 normal, no murmur, click, rub or gallop Abdomen: soft, non-tender; bowel sounds normal; no masses,  no organomegaly Pelvis: Lochia: appropriate, Uterine Fundus: firm Extremities: DVT Evaluation: No evidence of DVT seen on physical exam. Negative Homan's sign. No cords or calf tenderness. No significant calf/ankle edema.    Recent Labs    03/15/19 1634 03/17/19 0621  HGB 10.4* 7.6*  HCT 33.3* 24.4*    Assessment/Plan: Doing well postpartum.  Breastfeeding, Lactation consult as needed.  Contraception desires vasectomy, unsure if she desires to use anything during interim. Anemia of pregnancy, worsened after delivery. Asymptomatic. Can d/c home with PO iron supplementation.  Discharge home later today.     LOS: 2 days   Rubie Maid, MD [facility] 03/17/2019 10:38 AM

## 2019-03-17 NOTE — Lactation Note (Signed)
This note was copied from a baby's chart. Lactation Consultation Note  Patient Name: Girl Tunya Held [id number] Date: 03/17/2019 Reason for consult: Follow-up assessment  Onalee Hua was fussy and sucking on hands after 24 hr testing.  Handed Hazel to mom and she latched without assistance and began strong, rhythmic sucking with audible swallows.  Mom denies any breast or nipple pain.  Mom and baby to be discharged soon.  Lactation community resources reviewed and contact numbers given.  Encouraged mom to call with any questions, concerns or assistance.   Maternal Data Formula Feeding for Exclusion: No Has patient been taught Hand Expression?: Yes Does the patient have breastfeeding experience prior to this delivery?: Yes  Feeding Feeding Type: Breast Fed  LATCH Score Latch: Grasps breast easily, tongue down, lips flanged, rhythmical sucking.  Audible Swallowing: Spontaneous and intermittent  Type of Nipple: Everted at rest and after stimulation  Comfort (Breast/Nipple): Soft / non-tender  Hold (Positioning): No assistance needed to correctly position infant at breast.  LATCH Score: 10  Interventions Interventions: Breast compression;Support pillows  Lactation Tools Discussed/Used WIC Program: Yes   Consult Status Consult Status: PRN    Jarold Motto 03/17/2019, 5:13 PM

## 2019-03-17 NOTE — Progress Notes (Signed)
DC inst reviewed with pt.  Verb u/o.  DC to car by nursing staff

## 2019-03-17 NOTE — Anesthesia Postprocedure Evaluation (Signed)
Anesthesia Post Note  Patient: Madison Randall  Procedure(s) Performed: AN AD HOC LABOR EPIDURAL  Patient location during evaluation: Mother Baby Anesthesia Type: Epidural Level of consciousness: awake and alert Pain management: pain level controlled Vital Signs Assessment: post-procedure vital signs reviewed and stable Respiratory status: spontaneous breathing, nonlabored ventilation and respiratory function stable Cardiovascular status: stable Postop Assessment: no headache, no backache and epidural receding Anesthetic complications: no     Last Vitals:  Vitals:   03/17/19 0320 03/17/19 0810  BP: 122/71 120/67  Pulse: 80 80  Resp: 20 20  Temp: (!) 36.4 C 36.4 C  SpO2: 100% 100%    Last Pain:  Vitals:   03/17/19 0810  TempSrc: Oral  PainSc:                  Durenda Hurt

## 2019-03-18 ENCOUNTER — Emergency Department: Payer: Medicaid Other

## 2019-03-18 ENCOUNTER — Emergency Department
Admission: EM | Admit: 2019-03-18 | Discharge: 2019-03-18 | Disposition: A | Discharge status: Home / Self Care | Payer: Medicaid Other | Attending: Emergency Medicine | Admitting: Emergency Medicine

## 2019-03-18 ENCOUNTER — Encounter: Payer: Self-pay | Admitting: *Deleted

## 2019-03-18 ENCOUNTER — Other Ambulatory Visit: Payer: Self-pay

## 2019-03-18 DIAGNOSIS — Z79899 Other long term (current) drug therapy: Secondary | ICD-10-CM | POA: Diagnosis not present

## 2019-03-18 DIAGNOSIS — N939 Abnormal uterine and vaginal bleeding, unspecified: Secondary | ICD-10-CM

## 2019-03-18 LAB — CBC
HCT: 25.6 % — ABNORMAL LOW (ref 36.0–46.0)
Hemoglobin: 8 g/dL — ABNORMAL LOW (ref 12.0–15.0)
MCH: 25.5 pg — ABNORMAL LOW (ref 26.0–34.0)
MCHC: 31.3 g/dL (ref 30.0–36.0)
MCV: 81.5 fL (ref 80.0–100.0)
Platelets: 267 10*3/uL (ref 150–400)
RBC: 3.14 MIL/uL — ABNORMAL LOW (ref 3.87–5.11)
RDW: 19.7 % — ABNORMAL HIGH (ref 11.5–15.5)
WBC: 10.2 10*3/uL (ref 4.0–10.5)
nRBC: 0.2 % (ref 0.0–0.2)

## 2019-03-18 LAB — BASIC METABOLIC PANEL
Anion gap: 8 (ref 5–15)
BUN: 7 mg/dL (ref 6–20)
CO2: 22 mmol/L (ref 22–32)
Calcium: 8.2 mg/dL — ABNORMAL LOW (ref 8.9–10.3)
Chloride: 110 mmol/L (ref 98–111)
Creatinine, Ser: 0.57 mg/dL (ref 0.44–1.00)
GFR calc Af Amer: >60 mL/min (ref >60)
GFR calc non Af Amer: >60 mL/min (ref >60)
Glucose, Bld: 99 mg/dL (ref 70–99)
Potassium: 3.8 mmol/L (ref 3.5–5.1)
Sodium: 140 mmol/L (ref 135–145)

## 2019-03-18 LAB — HCG, QUANTITATIVE, PREGNANCY: hCG, Beta Chain, Quant, S: 3584 m[IU]/mL — ABNORMAL HIGH (ref <5)

## 2019-03-18 NOTE — ED Provider Notes (Signed)
[facility] Emergency Department Provider Note   ____________________________________________   I have reviewed the triage vital signs and the nursing notes.   HISTORY  Chief Complaint Vaginal Bleeding   History limited by: Not Limited   HPI Madison Randall is a 33 y.o. female who presents to the emergency department today because of concerns for vaginal bleeding.  Patient states that she had a vaginal delivery 2 days ago.  States she had some normal postdelivery bleeding however today passed a roughly softball sized clot of blood.  Patient has continued to have some contractions.  Patient states she has had history of retained products and is required a D&C in the past.  Patient denies any significant shortness of breath.   Records reviewed. Per medical record review patient has a history of recent delivery  Past Medical History:  Diagnosis Date  . Amenorrhea   . Migraine   . Migraines     Patient Active Problem List   Diagnosis Date Noted  . Anemia of pregnancy in third trimester 03/16/2019  . History of macrosomia in infant in prior pregnancy, currently pregnant 09/19/2018  . Plantar fasciitis, bilateral 07/19/2017  . Sebaceous cyst of right axilla 07/19/2017  . Neuralgia involving scalp 07/19/2017  . PROM (premature rupture of membranes) 10/27/2016  . History of postpartum hemorrhage 08/23/2016  . Upper respiratory tract infection 08/23/2016  . Overweight (BMI 25.0-29.9) 04/28/2016  . Supervision of normal intrauterine pregnancy in multigravida 04/28/2016  . Migraines 04/04/2016    Past Surgical History:  Procedure Laterality Date  . DILATION AND CURETTAGE OF UTERUS  09/2014 uterine infection form delivery 2015    Prior to Admission medications   Medication Sig Start Date End Date Taking? Authorizing Provider  docusate sodium (COLACE) 100 MG capsule Take 1 capsule (100 mg total) by mouth 2 (two) times daily as needed. 03/17/19   Rubie Maid, MD  ferrous sulfate (FERROUSUL) 325 (65 FE) MG tablet Take 1 tablet (325 mg total) by mouth every other day. 12/07/18   Rubie Maid, MD  ibuprofen (ADVIL) 800 MG tablet Take 1 tablet (800 mg total) by mouth every 6 (six) hours. 03/17/19   Rubie Maid, MD  Multiple Vitamins-Minerals (IMMUNE SUPPORT VITAMIN C) PACK Take by mouth.    [provider]    Allergies Tdap [tetanus-diphth-acell pertussis], Imitrex [sumatriptan], and Topamax [topiramate]  Family History  Problem Relation Age of Onset  . Heart disease Father   . Alcohol abuse Father   . Breast cancer Maternal Grandmother   . Thyroid disease Maternal Grandmother   . Heart disease Maternal Grandfather   . Breast cancer Paternal Grandmother   . Breast cancer Other   . Other Mother        trigeminal neuralgia    Social History Social History   Tobacco Use  . Smoking status: Former Research scientist (life sciences)  . Smokeless tobacco: Never Used  Substance Use Topics  . Alcohol use: No  . Drug use: No    Review of Systems Constitutional: No fever/chills Eyes: No visual changes. ENT: No sore throat. Cardiovascular: Denies chest pain. Respiratory: Denies shortness of breath. Gastrointestinal: Positive for abdominal cramping Genitourinary: Positive for vaginal bleeding Musculoskeletal: Negative for back pain. Skin: Negative for rash. Neurological: Negative for headaches, focal weakness or numbness.  ____________________________________________   PHYSICAL EXAM:  VITAL SIGNS: ED Triage Vitals [03/18/19 1924]  Enc Vitals Group     BP (!) 140/99     Pulse Rate 99  Resp 16     Temp 98.6 F (37 C)     Temp Source Oral     SpO2 99 %     Weight 181 lb (82.1 kg)     Height      Head Circumference      Peak Flow      Pain Score 0   Constitutional: Alert and oriented.  Eyes: Conjunctivae are normal.  ENT      Head: Normocephalic and atraumatic.      Nose: No congestion/rhinnorhea.      Mouth/Throat: Mucous  membranes are moist.      Neck: No stridor. Cardiovascular: Normal rate, regular rhythm.  No murmurs, rubs, or gallops.  Respiratory: Normal respiratory effort without tachypnea nor retractions. Breath sounds are clear and equal bilaterally. No wheezes/rales/rhonchi. Musculoskeletal: Normal range of motion in all extremities.  Neurologic:  Normal speech and language. No gross focal neurologic deficits are appreciated.  Skin:  Skin is warm, dry and intact. No rash noted. Psychiatric: Mood and affect are normal. Speech and behavior are normal. Patient exhibits appropriate insight and judgment.  ____________________________________________    LABS (pertinent positives/negatives)  BMP wnl except ca 8.2 CBC wbc 10.2, hgb 8.0, plt 267 hcg 3584  ____________________________________________   EKG  None  ____________________________________________    RADIOLOGY  US Pelvis Thickened endometrium. Concern for retained products  ____________________________________________   PROCEDURES  Procedures  ____________________________________________   INITIAL IMPRESSION / ASSESSMENT AND PLAN / ED COURSE  Pertinent labs & imaging results that were available during my care of the patient were reviewed by me and considered in my medical decision making (see chart for details).   Patient presented to the emergency department today because of concerns for passing a large blood clot in the setting of her recent vaginal delivery.  Patient's blood work today does show anemia however is slightly improved from most recent blood test.  Patient's vital signs without tachycardia or hypotension.  Ultrasound did show findings concerning for retained products.  Discussed with Dr. Logan BoresEvans with OB/GYN.  Will plan on having patient follow-up in clinic tomorrow.  Patient felt comfortable with plan.  Discussed return precautions.   ____________________________________________   FINAL CLINICAL IMPRESSION(S)  / ED DIAGNOSES  Final diagnoses:  Retained products of conception, postpartum     Note: This dictation was prepared with Dragon dictation. Any transcriptional errors that result from this process are unintentional     Phineas SemenGoodman, Darrion Macaulay, MD 03/18/19 2232

## 2019-03-18 NOTE — ED Triage Notes (Signed)
Pt to ED reporting passing of a softball sized blood clot 2 hours ago. PT has had similar problems with past pregnancies and needed further treatment to remove retained products. PT is 1 day postpartum.

## 2019-03-18 NOTE — Discharge Instructions (Addendum)
Please seek medical attention for any high fevers, chest pain, shortness of breath, change in behavior, persistent vomiting, bloody stool or any other new or concerning symptoms.  

## 2019-03-18 NOTE — ED Notes (Signed)
Assumed care of patient reports s/p delivery few days ago. Patient reports bleeding has not stopped, has been having clot formations, denies saturated pads an hour, reports she is just dropping clots and last clot was the sized of baseball. U/s completed. Labs resulted awaiting md plan pof care.

## 2019-03-19 ENCOUNTER — Telehealth: Payer: Self-pay | Admitting: Obstetrics and Gynecology

## 2019-03-19 NOTE — Telephone Encounter (Signed)
The patient called and requested a call back from [place] some time today if possible. Pt did not disclose any other information. Please advise.

## 2019-03-20 ENCOUNTER — Telehealth: Payer: Self-pay | Admitting: Obstetrics and Gynecology

## 2019-03-20 MED ORDER — HYDROCODONE-ACETAMINOPHEN 5-325 MG PO TABS: 1.0000 | ORAL_TABLET | Freq: Four times a day (QID) | ORAL | 0 refills | Status: DC | PRN

## 2019-03-20 MED ORDER — MISOPROSTOL 200 MCG PO TABS: 800.0000 ug | ORAL_TABLET | Freq: Once | ORAL | 0 refills | Status: DC

## 2019-03-20 NOTE — Telephone Encounter (Signed)
The patient called and lvm for nurse to reach out to her as soon as possible in regards to a visit at the emergency room and needing a DNC. Pt requesting call back . Please advise.

## 2019-03-20 NOTE — Telephone Encounter (Signed)
The patients mother called and stated that the patient was prescribed a medication to stop irregular bleeding. The pt's mother also stated that since taking the medication she is experiencing shaking/invouluntary shaking and numbness and tingling in the mouth. Spoke w/ Nurse J.W./ Nurse advised pt to go to ED due to Severe medication reaction and being that her provider is no longer in the office. Thank you.

## 2019-03-20 NOTE — Telephone Encounter (Signed)
Contacted patient regarding concerns for postpartum bleeding. She is currently ~ 5 days postpartum. Notes passage of large clots. Was seen in the ER on Monday evening, had an ultrasound which noted thickened endometrium, possible retained POCs.  I have personally reviewed the ultrasound and do not note any evidence of POC's. Patient just extremely concerned as she has had to have a D&C after her third pregnancy for similar complaints.  Hgb recently checked in ER noted small increase from discharge Hgb. Discussed options of treating with Cytotec to help express the clots quicker, vs performing an in office D&C to remove the blood clots. Patient desires to try medication for now, will prescribe Cytotec. To be seen in the office tomorrow for further follow up.    Rubie Maid, MD [facility] 03/20/2019 1:35 PM

## 2019-03-20 NOTE — Telephone Encounter (Signed)
Spoke with patients mother and she stated that her daughter started a new medication today that Dr. Marcelline Mates sent in. She stated that her daughter was shaking vigorously and her lips was numb. I advised patient to go to the ED. She ask if Dr. Marcelline Mates would be called once she was in the ED. I advised that if the ED doctor thought that Dr. Marcelline Mates need to be called they would reach out to her. Patients mother verbalized understanding. Everything was discussed with Dr. Amalia Hailey since he was the MD in the office. He agreed with decision.

## 2019-03-21 ENCOUNTER — Other Ambulatory Visit: Payer: Self-pay

## 2019-03-21 ENCOUNTER — Ambulatory Visit (INDEPENDENT_AMBULATORY_CARE_PROVIDER_SITE_OTHER): Payer: Medicaid Other | Admitting: Obstetrics and Gynecology

## 2019-03-21 ENCOUNTER — Encounter: Payer: Self-pay | Admitting: Obstetrics and Gynecology

## 2019-03-21 NOTE — Progress Notes (Signed)
    GYNECOLOGY PROGRESS NOTE  Subjective:    Patient ID: Madison Randall, female    DOB: 10/17/1985, 33 y.o.   MRN: [id number]  HPI  Patient is a 33 y.o. [id number] female who presents for complaints of increased vaginal bleeding with passage of large clots over the past several days. She is 5 days postpartum from an SVD.  Patient does have a prior history of PP hemorrhage, with retained products, requiring a D&C. Notes that she took another dose of the Cytotec prescribed yesterday.  Reports she had cramping, passed more bright red blood and clots, and then it discontinued.  Does report side effects of the medication, including uncontrollable shaking and feeling like her throat was going to close up. Notes she self-treated with blankets and Benadryl.   Of note, patient states her abdominal muscles feel like they are "shredded".    The following portions of the patient's history were reviewed and updated as appropriate: allergies, current medications, past family history, past medical history, past social history, past surgical history and problem list.  Review of Systems Pertinent items noted in HPI and remainder of comprehensive ROS otherwise negative.   Objective:   Blood pressure 110/69, pulse 98, height 5\' 3"  (1.6 m), weight 164 lb 9.6 oz (74.7 kg), last menstrual period 06/11/2018, currently breastfeeding. General appearance: alert and no distress Abdomen: soft, non-tender; bowel sounds normal; no masses,  no organomegaly Pelvic: external genitalia normal, rectovaginal septum normal.  Vagina with scant amount of dark red bloody discharge.  Cervix normal appearing, no lesions and no motion tenderness.  Uterus mobile, nontender, shape and size.  Adnexae non-palpable, nontender bilaterally.     Imaging:   US PELVIS (TRANSABDOMINAL ONLY) CLINICAL DATA:  Assessment of retained products. Delivered on July 25th.  EXAM: TRANSABDOMINAL ULTRASOUND OF PELVIS  TECHNIQUE: Transabdominal  ultrasound examination of the pelvis was performed including evaluation of the uterus, ovaries, adnexal regions, and pelvic cul-de-sac. Patient declined transvaginal ultrasound.  COMPARISON:  None.  FINDINGS: Uterus  Measurements: 17.8 x 7.3 x 12.9 cm = volume: 888 mL. No fibroids or other mass visualized.  Endometrium  Thickness: 3.1 cm. Generalized thickening and heterogeneity without focal mass or fluid collection.  Right ovary  Measurements: Not seen. No mass or free fluid identified in the RIGHT adnexal region.  Left ovary  Measurements: Not seen. No mass or free fluid identified in the LEFT adnexal region  Other findings:  No abnormal free fluid.  IMPRESSION: Thickened/heterogeneous endometrial complex, measuring up to 3.1 cm thickness, suspicious for retained products of conception if persistent bleeding.  Electronically Signed   By: Franki Cabot M.D.   On: 03/18/2019 21:17   Assessment:   Abnormal uterine bleeding, postpartum  Postpartum state  Plan:   - Postpartum bleeding with possible retained products. On review of ultrasound, appears more like moderate sized clots, no retained tissue products present. Patient treated medically with a dose of Cytotec yesterday. Notes bleeding has decreased since yesterday. Continue to monitor. Will f/u with an ultrasound in 1 week to ensure thinning of endometrium and improvement in ultrasound findings.  - Postpartum state, patient noting feeling her abdominal muscles are very weak, especially when trying to ambulate or change positions. Advised on postpartum compression garment.  - RTC in 5 weeks postpartum.    Rubie Maid, MD [facility]

## 2019-03-21 NOTE — Progress Notes (Signed)
Pt is present today due to having blood clots the size of a golf ball after the birth of the baby. Pt stated having pain in her abd and back area.

## 2019-03-27 ENCOUNTER — Telehealth: Payer: Self-pay

## 2019-03-27 NOTE — Telephone Encounter (Signed)
Coronavirus (COVID-19) Are you at risk?  Are you at risk for the Coronavirus (COVID-19)?  To be considered HIGH RISK for Coronavirus (COVID-19), you have to meet the following criteria:  . Traveled to [place], [place], [place], [place] or [place]; or in the [place] to [place], [place], [place], or [place]; and have fever, cough, and shortness of breath within the last 2 weeks of travel OR . Been in close contact with a person diagnosed with COVID-19 within the last 2 weeks and have fever, cough, and shortness of breath . IF YOU DO NOT MEET THESE CRITERIA, YOU ARE CONSIDERED LOW RISK FOR COVID-19.  What to do if you are HIGH RISK for COVID-19?  . If you are having a medical emergency, call 911. . Seek medical care right away. Before you go to a doctor's office, urgent care or emergency department, call ahead and tell them about your recent travel, contact with someone diagnosed with COVID-19, and your symptoms. You should receive instructions from your physician's office regarding next steps of care.  . When you arrive at healthcare provider, tell the healthcare staff immediately you have returned from visiting [place], [place], [place], [place] or [place]; or traveled in the [place] to [place], [place], [place], or [place]; in the last two weeks or you have been in close contact with a person diagnosed with COVID-19 in the last 2 weeks.   . Tell the health care staff about your symptoms: fever, cough and shortness of breath. . After you have been seen by a medical provider, you will be either: o Tested for (COVID-19) and discharged home on quarantine except to seek medical care if symptoms worsen, and asked to  - Stay home and avoid contact with others until you get your results (4-5 days)  - Avoid travel on public transportation if possible (such as bus, train, or airplane) or o Sent to the Emergency Department by EMS for evaluation, COVID-19 testing, and possible  admission depending on your condition and test results.  What to do if you are LOW RISK for COVID-19?  Reduce your risk of any infection by using the same precautions used for avoiding the common cold or flu:  . Wash your hands often with soap and warm water for at least 20 seconds.  If soap and water are not readily available, use an alcohol-based hand sanitizer with at least 60% alcohol.  . If coughing or sneezing, cover your mouth and nose by coughing or sneezing into the elbow areas of your shirt or coat, into a tissue or into your sleeve (not your hands). . Avoid shaking hands with others and consider head nods or verbal greetings only. . Avoid touching your eyes, nose, or mouth with unwashed hands.  . Avoid close contact with people who are Madison Randall. . Avoid places or events with large numbers of people in one location, like concerts or sporting events. . Carefully consider travel plans you have or are making. . If you are planning any travel outside or inside the [place], visit the CDC's Travelers' Health webpage for the latest health notices. . If you have some symptoms but not all symptoms, continue to monitor at home and seek medical attention if your symptoms worsen. . If you are having a medical emergency, call 911.  03/27/19 SCREENING NEG SLS ADDITIONAL HEALTHCARE OPTIONS FOR PATIENTS   Telehealth / e-Visit: [email or web address]         [facility]: [phone]    [facility]: [phone]                   [facility]: [phone]  

## 2019-03-28 ENCOUNTER — Ambulatory Visit (INDEPENDENT_AMBULATORY_CARE_PROVIDER_SITE_OTHER): Payer: Medicaid Other

## 2019-03-28 ENCOUNTER — Other Ambulatory Visit: Payer: Self-pay

## 2019-03-28 ENCOUNTER — Other Ambulatory Visit: Payer: Self-pay | Admitting: Obstetrics and Gynecology

## 2019-03-28 DIAGNOSIS — Z3482 Encounter for supervision of other normal pregnancy, second trimester: Secondary | ICD-10-CM

## 2019-05-14 ENCOUNTER — Other Ambulatory Visit: Payer: Self-pay

## 2019-05-14 ENCOUNTER — Encounter: Payer: Medicaid Other | Admitting: Obstetrics and Gynecology

## 2019-05-14 ENCOUNTER — Encounter: Payer: Self-pay | Admitting: Obstetrics and Gynecology

## 2019-05-14 ENCOUNTER — Ambulatory Visit (INDEPENDENT_AMBULATORY_CARE_PROVIDER_SITE_OTHER): Payer: Medicaid Other | Admitting: Obstetrics and Gynecology

## 2019-05-14 DIAGNOSIS — Z30011 Encounter for initial prescription of contraceptive pills: Secondary | ICD-10-CM

## 2019-05-14 DIAGNOSIS — H00019 Hordeolum externum unspecified eye, unspecified eyelid: Secondary | ICD-10-CM | POA: Insufficient documentation

## 2019-05-14 DIAGNOSIS — O9081 Anemia of the puerperium: Secondary | ICD-10-CM

## 2019-05-14 DIAGNOSIS — H00014 Hordeolum externum left upper eyelid: Secondary | ICD-10-CM

## 2019-05-14 DIAGNOSIS — N8189 Other female genital prolapse: Secondary | ICD-10-CM

## 2019-05-14 MED ORDER — NORETHINDRONE 0.35 MG PO TABS: 1.0000 | ORAL_TABLET | Freq: Every day | ORAL | 0 refills | Status: DC

## 2019-05-14 NOTE — Progress Notes (Signed)
   OBSTETRICS POSTPARTUM CLINIC PROGRESS NOTE  Subjective:     Madison Randall is a 33 y.o. [phone] female who presents for a postpartum visit. She is 8 weeks postpartum following a spontaneous vaginal delivery. I have fully reviewed the prenatal and intrapartum course. The delivery was at 39.5 gestational weeks.  Anesthesia: epidural. Postpartum course has been well. Baby's course has been well. Baby is feeding by breast. Bleeding: patient has not resumed menses. Bowel function is normal. Bladder function is normal. Patient is not sexually active. Contraception method desired is vasectomy (husband had it performed ~ 2 weeks ago). Postpartum depression screening: negative EPDS = 5).   The following portions of the patient's history were reviewed and updated as appropriate: allergies, current medications, past family history, past medical history, past social history, past surgical history and problem list.   Review of Systems A comprehensive review of systems was negative except for:  1. Eyes: positive for multiple stye's on her eyes.  She notes that the frequency of occurrences increased towards the latter end of the pregnancy, and is still ongoing.  Is now getting them much more frequently on the left eye.  Has been treating with tea tree oil which has helped some.   2. She also complains of continued feelings of pelvic pressure and relaxation.  Notes that she still feels like "something is going to fall out of her vagina when she coughs or sneezes".  Has been improving over he past 3 weeks, however is still present.   Objective:    BP 132/87   Pulse 73   Ht 5\' 3"  (1.6 m)   Wt 173 lb 4.8 oz (78.6 kg)   LMP 06/11/2018   Breastfeeding Yes   BMI 30.70 kg/m   General:  alert and no distress   Breasts:  inspection negative, no nipple discharge or bleeding, no masses or nodularity palpable  Lungs: clear to auscultation bilaterally  Heart:  regular rate and rhythm, S1, S2 normal, no  murmur, click, rub or gallop  Abdomen: soft, non-tender; bowel sounds normal; no masses,  no organomegaly.     Vulva:  normal  Vagina: normal vagina, no discharge, exudate, lesion, or erythema  Cervix:  no cervical motion tenderness and no lesions  Corpus: normal size, contour, position, consistency, mobility, non-tender  Adnexa:  normal adnexa and no mass, fullness, tenderness  Rectal Exam: Not performed.         Labs:  Lab Results  Component Value Date   HGB 8.0 (L) 03/18/2019     Assessment:   Postpartum care following vaginal delivery Postpartum anemia  Pelvic floor relaxation  Hordeolum externum of left upper eyelid  Plan:    1. Contraception: vasectomy.  Patient desires to use something as a backup method until her partner's sterility has been confirmed.  As patient is breastfeeding, will prescribe Micronor. Advised to take at same time every day. Advised on Sunday start. To use backup method for first 2 weeks. UPT done today, negative.   2. Will check Hgb for h/o anemia.  3. Pelvic floor relaxation, discussed Kegel exercises, can also refer to pelvic floor physical therapy. Patient currently using abdominal binder.  4. Stye of eyelids (L>R). Continue use of tea tree oil. Also given samples of Triple antibiotic ointment.  5. Follow up in: 3 months for annual exam, or sooner as needed.    Rubie Maid, MD [facility]

## 2019-05-14 NOTE — Patient Instructions (Signed)

## 2019-05-14 NOTE — Progress Notes (Signed)
   PT is present today for her postpartum visit. Pt stated that she is breastfeeding and have not had sexually intercourse recently. Pt stated that she would like to get a low hormone IUD for birth control due to having headaches in the past due to birth control. EPDS= 5.  Pt stated that she is doing well no complaints.

## 2019-05-15 LAB — POCT URINE PREGNANCY: Preg Test, Ur: NEGATIVE

## 2019-05-15 LAB — HEMOGLOBIN AND HEMATOCRIT, BLOOD
Hematocrit: 36 % (ref 34.0–46.6)
Hemoglobin: 11.9 g/dL (ref 11.1–15.9)

## 2019-05-15 NOTE — Addendum Note (Signed)
Addended by: Edwyna Shell on: 05/15/2019 02:30 PM   Modules accepted: Orders

## 2019-05-28 ENCOUNTER — Other Ambulatory Visit: Payer: Self-pay

## 2019-05-28 ENCOUNTER — Ambulatory Visit: Payer: Medicaid Other | Attending: Obstetrics and Gynecology

## 2019-05-28 DIAGNOSIS — M62838 Other muscle spasm: Secondary | ICD-10-CM

## 2019-05-28 DIAGNOSIS — R293 Abnormal posture: Secondary | ICD-10-CM | POA: Diagnosis present

## 2019-05-28 DIAGNOSIS — M4125 Other idiopathic scoliosis, thoracolumbar region: Secondary | ICD-10-CM | POA: Diagnosis present

## 2019-05-28 NOTE — Therapy (Signed)
[facility] MAIN [facility] SERVICES [address] [place], [place], [address] Phone: [phone]   Fax:  [phone]  Physical Therapy Evaluation  The patient has been informed of current processes in place at Outpatient Rehab to protect patients from Covid-19 exposure including social distancing, schedule modifications, and new cleaning procedures. After discussing their particular risk with a therapist based on the patient's personal risk factors, the patient has decided to proceed with in-person therapy.   Patient Details  Name: Madison Randall MRN: [id number] Date of Birth: 05-Sep-1985 No data recorded  Encounter Date: 05/28/2019  PT End of Session - 05/28/19 1826    Visit Number  1    Number of Visits  10    Date for PT Re-Evaluation  08/06/19    Authorization - Visit Number  1    Authorization - Number of Visits  10    PT Start Time  1530    PT Stop Time  1700    PT Time Calculation (min)  90 min    Activity Tolerance  Patient tolerated treatment well;No increased pain    Behavior During Therapy  WFL for tasks assessed/performed       Past Medical History:  Diagnosis Date  . Amenorrhea   . Migraine   . Migraines     Past Surgical History:  Procedure Laterality Date  . DILATION AND CURETTAGE OF UTERUS  09/2014 uterine infection form delivery 2015    There were no vitals filed for this visit.     Pelvic Floor Physical Therapy Evaluation and Assessment  SCREENING  Falls in last 6 mo: Yes, 4 months ago (about 6 months and 8 months gestation). Patient reports her right leg gave out and she fell twice. Walked with a walker for most of her pregnancy.   Interpreter Needed?: No Interpreter Agency:  Interpreter Name  Interpreter ID  Patient Declined Interpreter   Patient signed [facility] waiver    Patient's communication preference:   Red Flags:  Have you had any night sweats? Yes- since baby 3 was born.  Unexplained weight  loss? No  Saddle anesthesia? No  Unexplained changes in bowel or bladder habits? No   SUBJECTIVE  Patient reports: Patient has arrived to PT approx 9-10 weeks post partum. Patient was seen 8 weeks postpartum by her OBGYN, upon patient request because she did not feel confidence enough during 6 weeks postpartum. Patient reports this was her 5th child. Patient's primary concerns is feeling "very weak in (my) female areas".  Shares with any straining/ pressure- causes her to feel like "something is going to fall out" such as when she blows her nose.   Patient has a history of L shoulder and R hip pain. R hip pain was resolved with pain medication during final weeks of gestation, which assisted in increasing patients sleep. (Patient was unable to sleep adequately during most of pregnancy). Patient shares her R LE "gave out" multiple times during her pregnancy, and therefore she used a walker for the second half of her pregnancy. She shares it feels like her "hip is falling out of place". And will need to set the child down or hold onto something while going up/down the stairs.  L shoulder is "untouched" by any pain medication. Patient has had to stop carrying her children on her L side due to sharp pain.   Patient also has a history of migraines. She shares she's had them for her "whole life" however it has  increased in intensity since her most recent delivery. She shares that her migraines have increased to a point she had to lay down the whole day 6x in the last 10 weeks. Patient additionally shares she believes there is a connection to when she labor/delivery of her last child because she had to hold her neck during this delivery. She had an epidural with this delivery.  Patient shares she was in labor for 58 hours, but only pushed for 2 minutes. Feels like migraines radiating from the neck.   Patient started walking again for function/activity about 1 weeks ago, and she feels like her R hip pain  increases the longer she goes. She often is pushing a stroller. Patient has 5 living children. Patient shares her pain is present all the time.   Patient also shares that she has "back spasms" that she has had for so long, she did not realize they may be related or abel to resolve.   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    Recent Procedures/Tests/Findings:  Hbg is back to normal.   Obstetrical History: [id number]  2-3 stitches per delivery.   Gynecological History: none  Urinary History: - no leakage with laugh/sneeze  - sitting will not feel as uncomfortable- resolved at about 6 weeks PP  - blowing nose- no urinary leakage but increased pressure "like is something is falling out"  - goes every 2 hours   - bulge sensation with bowel movement, still painful. Not straining.   Gastrointestinal History: "good pooper" no straining but does feel increased POP pressure   Sexual activity/pain: 9 weeks- 10 weeks. Ripping. Uncomfortable. Fearful. Like it shouldn't happen. Not pleasurable.   Location of pain: R hip  Current pain:  4/10  Max pain:  10/10 with activity and laying down Least pain:  2/10 Nature of pain: deep ache  -- still present during sitting(numbness/nerving), walking, standing, stairs, sleeping.  --off load the hip, heating pad, deep blue rub oil, Frankenstein (enjoys essential oil), hot baths.   Location of pain: L shoulder (2.5 years it started, increased with last pregnancy)  Current pain: 0/10 on NPS  Max pain: 8/10 with laying on it, lay on stomach or back flat, and same pain or carrying ( has started using carrier for baby currently decreases to a 5/10) Least pain: 0/10 on NPS  Nature of pain: stabbing/burning.  --Nothing touched the L shoulder pain.  No X-ray.  -- Migraines cause decreased neck rotation.   Of note: "would not be described as a hypermobile."   Patient Goals: #1 goal- would to be to feel  better in the pelvic area. Scared to be initiate with husband again. Still feels a bulge.    OBJECTIVE  Posture/Observations:  Sitting:  Standing:  -- **R Rib Shift  -- possible lower/forward innominate shift on R; PSIS - right up-slip (a hair)   -- posterior pelvic tilt   Palpation/Segmental Motion/Joint Play: --L SCM>R SCM- TTP  --B Pectineus -TTP   - in prone: all sacral borders; mobile, but tender  - in prone: B piriformis, R>L; B OI R>L   -- C2 transverse processes appears slightly more prominent in prone.  -- with PA glides (I/II for assessment): has good mobility. L1-5; however C/T junction tender with pressure and decrease mobility. Good mobility throughout otherwise.    Special tests:   - Cervical distraction (-)  - Cervical Compression (-)  Lambert Mody Purser (-)  - Alar Ligament (-)  Range of Motion/Flexibilty:  Spine:  --SB: L- 1/2 a finger length above knee; R 1 finger length below knee joint  --FF: about 4 inches from the floor   --Mild R side high  -- ROT: WNL to the R; 25% of motion to the L  -- Decreased rotation to the L (restriciton no pain)    Hips:  - no rotation noted with supine-to-long sit - in supine: level ASIS      Cervical: - pain with R rotation in R cervical spine 75% of motion  - pain with L rotation in R cervical spine 75% of motion  - unstable feeling with cervical extension about 75% of motion - "similar to what migraines feel like but much more intense"  - more stability with cervical flexion - WFL  - cervical SB approx 30 degrees to the R, tightness felt on L side  - cervical SB approx 45 degrees to the L, no pain.  Of note: slight curve through cervical rotation; erythema noted around C1/2     Strength/MMT: deferred to follow up  LE MMT  LE MMT Left Right  Hip flex:  (L2) /5 /5  Hip ext: /5 /5  Hip abd: /5 /5  Hip add: /5 /5  Hip IR /5 /5  Hip ER /5 /5       Abdominal:  Palpation: B Deep Hip Flexors, R>L   Diastasis: (-) for separation  SLR, positive for pain on R, no DR with either.   Pelvic Floor External Exam: Introitus Appears:  Skin integrity:  Palpation: Cough: Prolapse visible?: Scar mobility:  Internal Vaginal Exam: Deferred to Follow up  Strength (PERF):  Symmetry: Palpation: Prolapse:   Internal Rectal Exam: Deferred to follow up  Strength (PERF): Symmetry: Palpation: Prolapse:   Gait Analysis: Deferred to follow up    Pelvic Floor Outcome Measures: PFDI: 77/300, NDI: 19/35, Female NIH-CPSI: 23/43 (53%)  INTERVENTIONS THIS SESSION: Self Care: Patient educated on the structure and function of the pelvic floor in relation to their symptoms, as well as scoliotic symptomology, and several TTP (cervical, hips/glutes, and spine) as well as the POC, and initial HEP in order to set patient expectations and understanding from which we will build on in the future sessions. -- Education on side plank for scoliotic correction.    Total time: 90 minutes                 Objective measurements completed on examination: See above findings.                PT Short Term Goals - 05/28/19 1839      PT SHORT TERM GOAL #1   Title  Patient will demonstrate improved pelvic alignment and balance of musculature surrounding the pelvis to facilitate decreased PFM spasms and decrease pelvic pain.    Baseline  posterior pelvic tilt, R rib shift, R scoliosis, spasms surrounding pelvis.    Time  5    Period  Weeks    Status  New    Target Date  07/02/19      PT SHORT TERM GOAL #2   Title  Patient will demonstrate HEP x1 in the clinic to demonstrate understanding and proper form to allow for further improvement.    Baseline  Pt. lacks knowledge of therapeutic exercises that can help decrease her Sx.    Time  5    Period  Weeks    Status  New    Target Date  07/02/19  PT SHORT TERM GOAL #3   Title  Patient will demonstrate improved sitting and  standing posture to demonstrate learning and decrease stress on the pelvic floor with functional activity.    Baseline  posterior pelvic tilt, R rib-shift    Time  5    Period  Weeks    Status  New    Target Date  07/02/19      PT SHORT TERM GOAL #4   Title  Patient will demonstrate a coordinated contraction, relaxation, and bulge of the pelvic floor muscles to demonstrate functional recruitment and motion and allow for further strengthening.    Baseline  Pt. demonstrates decreased recruitment and coordination of the PFM evidenced by her POP symptoms.    Time  5    Period  Weeks    Status  New    Target Date  07/02/19        PT Long Term Goals - 05/28/19 2212      PT LONG TERM GOAL #1   Title  Patient will describe feeling of pressure no more than 5% of the time over the course of the past week to demonstrate improved recruitment and strength of the pelvic floor.    Baseline  Pt. having increased POP sensation with walking for exercise, having a BM, sneezing, etc.    Time  10    Period  Weeks    Status  New    Target Date  08/06/19      PT LONG TERM GOAL #2   Title  Patient will score at or below 32/300 on the PFDI, 25% on the Female NIH-CPSI and 10/35 on the NDI to demonstrate a clinically meaningful decrease in disability and distress due to pelvic floor dysfunction.    Baseline  PFDI: 77/300, NDI: 19/35, Female NIH-CPSI: 23/43 (53%)    Time  10    Period  Weeks    Status  New    Target Date  08/06/19      PT LONG TERM GOAL #3   Title  Patient will report no episodes of SUI over the course of the prior two weeks to demonstrate improved functional ability.    Baseline  Having SUI with cough, sneeze, etc.    Time  10    Period  Weeks    Status  New    Target Date  08/06/19      PT LONG TERM GOAL #4   Title  Patient will describe pain no greater than 1/10 during laying down to demonstrate improved ability to sleep for overall health.    Baseline  Pain as high as 10/10,  worst when laying down in the neck, shoulders, r hip, (and migraines)    Time  10    Period  Weeks    Status  New    Target Date  08/06/19             Plan - 05/28/19 1659    Clinical Impression Statement  Pt. is a 89 y/ofemale who presents today with cheif c/o pelvic organ prolapse sensaton following 5th vaginal delivery 9 weeks prior to this evaluation. Her PMH is significant for mild vaginal tearing with 4 of 5 vaginal deliveries, chronic migraines, plantar fasciitis, and mild scoliosis. Her clinical exam revealed a R rib-shift and decreased L thoracolumbar ROM and mild R c-curve scoliosis as well as spasms surrounding the pelvis and through the cervical extensors and L>R shoulder stabilizers. She will benefit from skilled pelvic health PT  to continue to assess for and address other potential causes of Sx.    Personal Factors and Comorbidities  Comorbidity 3+    Comorbidities  Migraines, Plantar Fasciitis, Scoliosis    Examination-Activity Limitations  Sit;Sleep;Lift;Squat;Bend;Caring for Others;Locomotion Level;Stand;Transfers;Toileting    Examination-Participation Restrictions  Interpersonal Relationship;Community Activity;Shop;Driving;Meal Prep    Stability/Clinical Decision Making  Unstable/Unpredictable    Clinical Decision Making  High    Rehab Potential  Good    PT Frequency  1x / week    PT Duration  Other (comment)   10 weeks   PT Treatment/Interventions  ADLs/Self Care Home Management;Biofeedback;Electrical Stimulation;Moist Heat;Traction;Gait training;Functional mobility Network engineer;Therapeutic activities;Therapeutic exercise;Balance training;Neuromuscular re-education;Patient/family education;Manual techniques;Scar mobilization;Passive range of motion;Taping;Dry needling;Visual/perceptual remediation/compensation;Spinal Manipulations;Joint Manipulations    PT Next Visit Plan  rib-shift correction    Consulted and Agree with Plan of Care  Patient        Patient will benefit from skilled therapeutic intervention in order to improve the following deficits and impairments:  Decreased balance, Decreased endurance, Difficulty walking, Increased muscle spasms, Improper body mechanics, Decreased activity tolerance, Decreased coordination, Decreased strength, Increased fascial restricitons, Postural dysfunction, Pain  Visit Diagnosis: Other idiopathic scoliosis, thoracolumbar region  Other muscle spasm  Abnormal posture     Problem List Patient Active Problem List   Diagnosis Date Noted  . Sty, external 05/14/2019  . Plantar fasciitis, bilateral 07/19/2017  . Sebaceous cyst of right axilla 07/19/2017  . Overweight (BMI 25.0-29.9) 04/28/2016  . Migraines 04/04/2016   Cleophus Molt DPT, ATC Cleophus Molt 05/28/2019, 10:25 PM  [place] [facility] MAIN [facility] SERVICES [address] [place], [place], [address] Phone: [phone]   Fax:  [phone]  Name: Madison Randall MRN: [id number] Date of Birth: 10/01/1985

## 2019-06-11 ENCOUNTER — Ambulatory Visit: Payer: Medicaid Other

## 2019-06-18 ENCOUNTER — Other Ambulatory Visit: Payer: Self-pay

## 2019-06-18 ENCOUNTER — Ambulatory Visit: Payer: Medicaid Other

## 2019-06-18 DIAGNOSIS — M62838 Other muscle spasm: Secondary | ICD-10-CM

## 2019-06-18 DIAGNOSIS — R293 Abnormal posture: Secondary | ICD-10-CM

## 2019-06-18 DIAGNOSIS — M4125 Other idiopathic scoliosis, thoracolumbar region: Secondary | ICD-10-CM | POA: Diagnosis not present

## 2019-06-18 NOTE — Patient Instructions (Addendum)
Stabilization: Diaphragmatic Breathing    Lie with knees bent, feet flat. Place one hand on stomach, other on chest. Breathe deeply through nose, lifting belly hand without any motion of hand on chest. Repeat __10__ times per set. Do ___2_ sets per session. Do ___7_ sessions per week.    Getting In/out of bed     Lying on back, bend left knee and place left arm across chest. Roll all in one movement to the right. Reverse to roll to the left. Always move as one unit.     Once you are lying on you side, move legs to edge of bed. Pull in the pelvic floor and lower tummy and push down with both hands while moving legs off bed to reach sitting position.  *Reverse sequence to return to lying down.  Copyright  VHI. All rights reserved.     Copyright  VHI. All rights reserved.      Sit, feet flat, scoot forward to the edge of the chair. Inhale as you bend forward at hips, begin to exhale just before and while you stand, contracting the glutes, lower tummy muscles and pelvic floor as if stopping urination as you stand up.   * Do this every time you sit or stand! If you catch yourself doing it "wrong, re-set and do it again so it can become habit!     Hold for 30 seconds (5 deep breaths) and repeat 2-3 times stretching the right (reaching to the left) side twice a day. Perform towards the right side as long as you feel a stretch.   [email or web address]  Access Code: [email or web address]  URL: [email or web address]  Date: 06/18/2019  Prepared by: Letitia Libra   Exercises Right Standing Lateral Shift Correction at [place] reps - 3 sets - 1x daily - 7x weekly

## 2019-06-18 NOTE — Therapy (Signed)
[facility] [address] [place], [place], [address] Phone: [phone]   Fax:  [phone]  Physical Therapy Treatment The patient has been informed of current processes in place at Outpatient Rehab to protect patients from Covid-19 exposure including social distancing, schedule modifications, and new cleaning procedures. After discussing their particular risk with a therapist based on the patient's personal risk factors, the patient has decided to proceed with in-person therapy.   Patient Details  Name: Madison Randall MRN: [id number] Date of Birth: October 05, 1985 No data recorded  Encounter Date: 06/18/2019  PT End of Session - 06/18/19 1550    Visit Number  2    Number of Visits  10    Date for PT Re-Evaluation  08/06/19    Authorization - Visit Number  2    Authorization - Number of Visits  10    PT Start Time  1430    PT Stop Time  1530    PT Time Calculation (min)  60 min    Activity Tolerance  Patient tolerated treatment well;No increased pain    Behavior During Therapy  WFL for tasks assessed/performed       Past Medical History:  Diagnosis Date  . Amenorrhea   . Migraine   . Migraines     Past Surgical History:  Procedure Laterality Date  . DILATION AND CURETTAGE OF UTERUS  09/2014 uterine infection form delivery 2015    There were no vitals filed for this visit.      Pelvic Floor Physical Therapy Treatment Note  SCREENING  Changes in medications, allergies, or medical history?: No    SUBJECTIVE  Patient reports: Patient reports she hurt her R hip last week- feels very unstable. She reports she can walk normal until "it" happens. Patient carried a 45-55lb child after her child's oral surgery. Has not carried anyone other than baby after that, she believes this is when R hip pain occurred. Tried an essential oil protocol (clove, spike nerd, melissa etc) to help decrease pain. Last Sunday was the  worst pain of the week. Has not tried heat or ice.   Was struggling with neck rotation this weekend as well- neck felt unstable- it hurt to hold head up. R>L.   L shoulder pain- no change   Pelvic health- "feels like something is ripping outside of me". Can only have penetrative sex while on her side. Take 3 days to recover. It hurts to pee after sex. Feels like "a leaky facet" for 3 days after sex. Can only have sex 1x/week. Goal would be 3x/week for intimacy time.   Precautions:  11-12 weeks postpartum   Pain update:  Location of pain: R hip (most significant at anterior hip)  Current pain:  5/10  Max pain:  10/10 Least pain:  5 /10 Nature of pain: sharp; takes your breath away; instability   Patient Goals: #1 goal- would to be to feel better in the pelvic area. Scared to be initiate with husband again. Still feels a bulge. Would like to be intimate 3x/week vs 1x/week.    OBJECTIVE  Changes in: Posture/Observations:  Anterior pelvic tilt in sitting and standing  R up-slip  Decreased R cervical rotation    Range of Motion/Flexibilty:    Strength/MMT:  LE MMT: NA  Pelvic floor: - assess next session  Abdominal:  RLQ fascial tension; decreased rib excursion bilaterally   Palpation: TTP to R illiacus, adductor magnus, QL, piriformis  Gait Analysis: Decreased stance time on R during gait; R hip circumduction pre session, reduced post session.   INTERVENTIONS THIS SESSION: Manual Therapy (35 minutes)  Performed TTP release to R illiacus, adductor magnus and QL, attempted R piriformis however did not decrease tension. Manual R up-slip correction performed with improved pelvic alignment post session. (pre test: Stork test- high R SLS instability, improved post test; decreased R hip circumduction in gait post session; no change in pain level post session). Manual TTP release to R SCM and B upper trap musculature- decreased cervical pain following session. Manual rib  mobilizations AP- for improved cues for diaphragmatic breathing. Manual therapy performed to decrease spasm and pain and allow for improved balance of musculature for improved function and decreased symptoms.  Therex: (20 minutes)  Performed R QL stretch in sitting position and R rib shift correction in standing - given as HEP, addressing pelvic alignment and sitting/standing posture for improved muscle coordination in relationship to patient's symptoms. Diaphragmatic breathing in hook-lying for improved pain management strategies and reducing abnormal intrabdominal pressure. Log rolling and sit <>stand with cues for breathing coordination- given in HEP for pain and symptom management.   Self Care: Education on using heat and PT HEP as assistance in pain management. (5 minutes)   Total time: 60 minutes                           PT Short Term Goals - 05/28/19 1839      PT SHORT TERM GOAL #1   Title  Patient will demonstrate improved pelvic alignment and balance of musculature surrounding the pelvis to facilitate decreased PFM spasms and decrease pelvic pain.    Baseline  posterior pelvic tilt, R rib shift, R scoliosis, spasms surrounding pelvis.    Time  5    Period  Weeks    Status  New    Target Date  07/02/19      PT SHORT TERM GOAL #2   Title  Patient will demonstrate HEP x1 in the clinic to demonstrate understanding and proper form to allow for further improvement.    Baseline  Pt. lacks knowledge of therapeutic exercises that can help decrease her Sx.    Time  5    Period  Weeks    Status  New    Target Date  07/02/19      PT SHORT TERM GOAL #3   Title  Patient will demonstrate improved sitting and standing posture to demonstrate learning and decrease stress on the pelvic floor with functional activity.    Baseline  posterior pelvic tilt, R rib-shift    Time  5    Period  Weeks    Status  New    Target Date  07/02/19      PT SHORT TERM GOAL #4   Title   Patient will demonstrate a coordinated contraction, relaxation, and bulge of the pelvic floor muscles to demonstrate functional recruitment and motion and allow for further strengthening.    Baseline  Pt. demonstrates decreased recruitment and coordination of the PFM evidenced by her POP symptoms.    Time  5    Period  Weeks    Status  New    Target Date  07/02/19        PT Long Term Goals - 05/28/19 2212      PT LONG TERM GOAL #1   Title  Patient will describe feeling of pressure no more than  5% of the time over the course of the past week to demonstrate improved recruitment and strength of the pelvic floor.    Baseline  Pt. having increased POP sensation with walking for exercise, having a BM, sneezing, etc.    Time  10    Period  Weeks    Status  New    Target Date  08/06/19      PT LONG TERM GOAL #2   Title  Patient will score at or below 32/300 on the PFDI, 25% on the Female NIH-CPSI and 10/35 on the NDI to demonstrate a clinically meaningful decrease in disability and distress due to pelvic floor dysfunction.    Baseline  PFDI: 77/300, NDI: 19/35, Female NIH-CPSI: 23/43 (53%)    Time  10    Period  Weeks    Status  New    Target Date  08/06/19      PT LONG TERM GOAL #3   Title  Patient will report no episodes of SUI over the course of the prior two weeks to demonstrate improved functional ability.    Baseline  Having SUI with cough, sneeze, etc.    Time  10    Period  Weeks    Status  New    Target Date  08/06/19      PT LONG TERM GOAL #4   Title  Patient will describe pain no greater than 1/10 during laying down to demonstrate improved ability to sleep for overall health.    Baseline  Pain as high as 10/10, worst when laying down in the neck, shoulders, r hip, (and migraines)    Time  10    Period  Weeks    Status  New    Target Date  08/06/19            Plan - 06/18/19 1551    Clinical Impression Statement  Pt. Responded well to all interventions today,  demonstrating improved pelvic alignment, gait performance, decreased cervical discomfort, as well as understanding and correct performance of all education and exercises provided today. They will continue to benefit from skilled physical therapy to work toward remaining goals and maximize function as well as decrease likelihood of symptom increase or recurrence.    Personal Factors and Comorbidities  Comorbidity 3+    Comorbidities  Migraines, Plantar Fasciitis, Scoliosis    Examination-Activity Limitations  Sit;Sleep;Lift;Squat;Bend;Caring for Others;Locomotion Level;Stand;Transfers;Toileting    Examination-Participation Restrictions  Interpersonal Relationship;Community Activity;Shop;Driving;Meal Prep    Stability/Clinical Decision Making  Unstable/Unpredictable    Rehab Potential  Good    PT Frequency  1x / week    PT Duration  Other (comment)   10 weeks   PT Treatment/Interventions  ADLs/Self Care Home Management;Biofeedback;Electrical Stimulation;Moist Heat;Traction;Gait training;Functional mobility Scientist, forensic;Therapeutic activities;Therapeutic exercise;Balance training;Neuromuscular re-education;Patient/family education;Manual techniques;Scar mobilization;Passive range of motion;Taping;Dry needling;Visual/perceptual remediation/compensation;Spinal Manipulations;Joint Manipulations    PT Next Visit Plan  reassess hip alignment; TP release; assess external PF musculature.    PT Home Exercise Plan  R rib shift correction; diaphgramatic breathing; R QL stretch; log rolling and STS edu; side plank;    Consulted and Agree with Plan of Care  Patient       Patient will benefit from skilled therapeutic intervention in order to improve the following deficits and impairments:  Decreased balance, Decreased endurance, Difficulty walking, Increased muscle spasms, Improper body mechanics, Decreased activity tolerance, Decreased coordination, Decreased strength, Increased fascial restricitons,  Postural dysfunction, Pain  Visit Diagnosis: Other idiopathic scoliosis, thoracolumbar region  Other muscle  spasm  Abnormal posture     Problem List Patient Active Problem List   Diagnosis Date Noted  . Sty, external 05/14/2019  . Plantar fasciitis, bilateral 07/19/2017  . Sebaceous cyst of right axilla 07/19/2017  . Overweight (BMI 25.0-29.9) 04/28/2016  . Migraines 04/04/2016    Madison Randall, Madison Randall  06/19/2019, 2:32 PM  [place] [facility] MAIN [facility] SERVICES [address] [place], [place], [address] Phone: [phone]   Fax:  [phone]  Name: Madison Randall MRN: [id number] Date of Birth: 1986-05-08

## 2019-06-25 ENCOUNTER — Other Ambulatory Visit: Payer: Self-pay

## 2019-06-25 ENCOUNTER — Ambulatory Visit: Payer: Medicaid Other | Attending: Obstetrics and Gynecology

## 2019-06-25 DIAGNOSIS — R293 Abnormal posture: Secondary | ICD-10-CM

## 2019-06-25 DIAGNOSIS — M62838 Other muscle spasm: Secondary | ICD-10-CM

## 2019-06-25 DIAGNOSIS — M4125 Other idiopathic scoliosis, thoracolumbar region: Secondary | ICD-10-CM | POA: Diagnosis present

## 2019-06-25 NOTE — Patient Instructions (Addendum)
Piriformis stretch- In sitting     Sit up with a tall spine and cross one leg over the other knee. Hinge from the hip and lean until you can feel a stretch through your bottom hold for 5 deep breaths and then switch sides. Repeat 2-3 times on each side.    Same Muscle Stretch but laying down:   Place one leg down flat, and then place the opposite ankle over the knee, for a further stretch bend the knee like the photo below. Hold for 5 belly breaths or for 30 seconds.    You do not have to do both of these stretch, choose one or the other based on when your symptoms occur and what fits best into your day.       Bring both knees up to your chest and then hold the one farthest from the edge of the table/bed and let the other relax toward the floor until stretch is felt through the front of the hip.   Hold-relax: Gently lift the knee of the down leg up ~ 1/2 and inch and hold for 5 seconds, then let it relax all the way for a second and repeat 4 more times to decrease resting tension in the muscle.   Stretch: Let the leg relax and feel a stretch across the front of the hip/thigh as you take belly breaths. Hold for __5__ deep belly breaths. Relax. Repeat __2-3__ times per side.    Do this __1-2__ times per day.     Self Trigger Point Release:       This is The QL muscle  To perform release on this muscle, start by getting into this position by bridging the hips up and then slowly lowering your back, then your butt down to lengthen the low back then put the ball under you where you feel the tender spot and roll to the same side slightly to add pressure as needed. Hold still and take deep breaths until the pain is at least 50% less or, ideally, just pressure.   This is your piriformis    To release this muscle start in this position with your ankle crossed over the opposite knee. Place the tennis ball under your buttock where the tender spot is and then slightly roll your weight to  the same side to put just enough pressure that it is uncomfortable. Hold and take deep breaths until the pain is at least 50% less or, ideally ,just pressure.   This is your Gluteus Medius and Minimus  To perform release on this muscle, start by getting into this position by bridging the hips up and then slowly lowering your back, then your butt down to lengthen the low back then put the ball under you where you feel the tender spot and roll to the same side slightly to add pressure as needed. Hold still and take deep breaths until the pain is at least 50% less or, ideally, just pressure.   These are your deep hip-flexor muscles. They are easiest to reach where they come together at the hip.   To perform release on this muscle, start by getting into this position by laying on your stomach then put the ball under you where you feel the tender spot and bring the opposite knee up/out to the side to add pressure as needed. Hold still and take deep breaths until the pain is at least 50% less or, ideally ,just pressure.

## 2019-06-25 NOTE — Therapy (Signed)
[place] [facility] [address] [place], [place], [address] Phone: [phone]   Fax:  [phone]  Physical Therapy Treatment The patient has been informed of current processes in place at Outpatient Rehab to protect patients from Covid-19 exposure including social distancing, schedule modifications, and new cleaning procedures. After discussing their particular risk with a therapist based on the patient's personal risk factors, the patient has decided to proceed with in-person therapy.  Patient Details  Name: Madison Randall MRN: [id number] Date of Birth: 06/27/1986 No data recorded  Encounter Date: 06/25/2019  PT End of Session - 06/25/19 1405    Visit Number  3    Number of Visits  13    Date for PT Re-Evaluation  09/03/19    Authorization Type  [employer]    Authorization Time Period  10 visits starting on 10-6    Authorization - Visit Number  3    Authorization - Number of Visits  13    PT Start Time  1335    PT Stop Time  1435    PT Time Calculation (min)  60 min    Activity Tolerance  Patient tolerated treatment well;No increased pain    Behavior During Therapy  WFL for tasks assessed/performed       Past Medical History:  Diagnosis Date  . Amenorrhea   . Migraine   . Migraines     Past Surgical History:  Procedure Laterality Date  . DILATION AND CURETTAGE OF UTERUS  09/2014 uterine infection form delivery 2015    There were no vitals filed for this visit.     Pelvic Floor Physical Therapy Treatment Note  SCREENING  Changes in medications, allergies, or medical history?: No    SUBJECTIVE  Patient reports: R hip pain flared up about an hour after last PT session and then it resolved. B styes. No SUI with cough, laugh and sneezing- usually only occurs with sexual activity which she did not participate in this week due to menstruation. Neck pain decrease lasted for about 4 days after PT. L shoulder pain- no  change. Standing to perform personal hygiene like shaving is difficult due to R hip pain.   Precautions:  12 weeks postpartum   Pain update:  Location of pain: R hip (most significant at anterior hip)  Current pain:  5/10  Max pain:  10/10 Least pain:  5 /10 Nature of pain: sharp; takes your breath away; instability; tingling numb feeling to mid lateral thigh.   *pain 3/10 following treatment   Patient Goals: #1 goal- would to be to feel better in the pelvic area. Scared to be initiate with husband again. Still feels a bulge. Would like to be intimate 3x/week vs 1x/week.    OBJECTIVE  Changes in: Posture/Observations:  Anterior pelvic tilt in sitting and standing  R up-slip  Decreased R cervical rotation    Range of Motion/Flexibilty:  Hypomobility at T/L junction   Strength/MMT:  LE MMT: NA  Pelvic floor: - assess next session  Abdominal:  decreased rib excursion bilaterally   Palpation: TTP to R illiacus, psoas, adductor magnus, QL, glute max/med,  piriformis    Gait Analysis: No R hip circumduction during pre session.   INTERVENTIONS THIS SESSION: Manual Therapy (43 minutes)  Performed DN TP release to R glut max/med, piriformis, and paraspinals/multifidi at L T12 to decrease spasm and pain and allow for improved balance of musculature for improved function and decreased symptoms. Muscle tension release  in order to have decreased reoccurrence of R hip symptoms after PT. Possible fascial pathway connection to base of skull on R side and L shoulder pain.    Dry-needle: performed TPDN with a .30x44mm needle and a .30x50mm needle as described below to decrease spasm and pain and allow for improved balance of musculature for improved function and decreased symptoms.  Therex: (10 minutes)  Diaphragmatic breathing in hook-lying for improved pain management strategies and reducing abnormal intrabdominal pressure. Improved diaphragmatic breathing in prone. Piriformis  stretch in supine. Performed patient education on tennis ball self trigger point release for improved pain management strategies.    Total time: 60 minutes         PT Short Term Goals - 06/25/19 1346      PT SHORT TERM GOAL #1   Title  Patient will demonstrate improved pelvic alignment and balance of musculature surrounding the pelvis to facilitate decreased PFM spasms and decrease pelvic pain.    Baseline  posterior pelvic tilt, R rib shift, R scoliosis, spasms surrounding pelvis. 11-3 decreased R rib shift, decreased posterior pelvic tilt.    Time  5    Period  Weeks    Status  On-going    Target Date  07/30/19      PT SHORT TERM GOAL #2   Title  Patient will demonstrate HEP x1 in the clinic to demonstrate understanding and proper form to allow for further improvement.    Baseline  Pt. lacks knowledge of therapeutic exercises that can help decrease her Sx. 11-3: pt compliance with HEP    Time  5    Period  Weeks    Status  On-going    Target Date  07/30/19      PT SHORT TERM GOAL #3   Title  Patient will demonstrate improved sitting and standing posture to demonstrate learning and decrease stress on the pelvic floor with functional activity.    Baseline  posterior pelvic tilt, R rib-shift; 11-3 decreaesd posterior pelvic tilt, improved R rib shift and improved.    Time  5    Period  Weeks    Status  On-going    Target Date  07/30/19      PT SHORT TERM GOAL #4   Title  Patient will demonstrate a coordinated contraction, relaxation, and bulge of the pelvic floor muscles to demonstrate functional recruitment and motion and allow for further strengthening.    Baseline  Pt. demonstrates decreased recruitment and coordination of the PFM evidenced by her POP symptoms. 11-3 improved understanding of diphgragmatic breathing in relaxationship to PFM.    Time  5    Period  Weeks    Status  On-going    Target Date  07/30/19        PT Long Term Goals - 06/25/19 1611      PT  LONG TERM GOAL #1   Title  Patient will describe feeling of pressure no more than 5% of the time over the course of the past week to demonstrate improved recruitment and strength of the pelvic floor.    Baseline  Pt. having increased POP sensation with walking for exercise, having a BM, sneezing, etc.    Time  10    Period  Weeks    Status  On-going    Target Date  09/03/19      PT LONG TERM GOAL #2   Title  Patient will score at or below 32/300 on the PFDI, 25% on the Female NIH-CPSI  and 10/35 on the NDI to demonstrate a clinically meaningful decrease in disability and distress due to pelvic floor dysfunction.    Baseline  PFDI: 77/300, NDI: 19/35, Female NIH-CPSI: 23/43 (53%)    Time  10    Period  Weeks    Status  On-going    Target Date  09/03/19      PT LONG TERM GOAL #3   Title  Patient will report no episodes of SUI over the course of the prior two weeks to demonstrate improved functional ability.    Baseline  Having SUI with cough, sneeze, etc.    Time  10    Period  Weeks    Status  On-going    Target Date  09/03/19      PT LONG TERM GOAL #4   Title  Patient will describe pain no greater than 1/10 during laying down to demonstrate improved ability to sleep for overall health.    Baseline  Pain as high as 10/10, worst when laying down in the neck, shoulders, r hip, (and migraines)    Time  10    Period  Weeks    Status  On-going    Target Date  09/03/19            Plan - 06/25/19 1406    Clinical Impression Statement  Pt. Responded well to all interventions today, demonstrating decreased muscle spasms along T/L junction and R innominate decreasing pain from 5/10 to 3/10 and improving pelvic alignment, as well as understanding and correct performance of all education and exercises provided today.   Patient has made gains in the short time since PT started- demonstrating improved understanding of her symptoms in relationship to clinical findings, compliance with HEP,  decreased pain, improved pelvic stability, improved posture and improved pain management strategies.   They will continue to benefit from skilled physical therapy to work toward remaining goals and maximize function as well as decrease likelihood of symptom increase or recurrence.     Personal Factors and Comorbidities  Comorbidity 3+    Comorbidities  Migraines, Plantar Fasciitis, Scoliosis    Examination-Activity Limitations  Sit;Sleep;Lift;Squat;Bend;Caring for Others;Locomotion Level;Stand;Transfers;Toileting    Examination-Participation Restrictions  Interpersonal Relationship;Community Activity;Shop;Driving;Meal Prep    Stability/Clinical Decision Making  Unstable/Unpredictable    Rehab Potential  Good    PT Frequency  1x / week    PT Duration  Other (comment)   10 weeks   PT Treatment/Interventions  ADLs/Self Care Home Management;Biofeedback;Electrical Stimulation;Moist Heat;Traction;Gait training;Functional mobility Network engineer;Therapeutic activities;Therapeutic exercise;Balance training;Neuromuscular re-education;Patient/family education;Manual techniques;Scar mobilization;Passive range of motion;Taping;Dry needling;Visual/perceptual remediation/compensation;Spinal Manipulations;Joint Manipulations    PT Next Visit Plan  reassess pelvic alignment; DN v TP release; assess external PF musculature; teach self or partner STM techniques;    PT Home Exercise Plan  R rib shift correction; diaphgramatic breathing; R QL stretch; log rolling and STS edu; side plank;    Consulted and Agree with Plan of Care  Patient       Patient will benefit from skilled therapeutic intervention in order to improve the following deficits and impairments:  Decreased balance, Decreased endurance, Difficulty walking, Increased muscle spasms, Improper body mechanics, Decreased activity tolerance, Decreased coordination, Decreased strength, Increased fascial restricitons, Postural dysfunction, Pain  Visit  Diagnosis: Other muscle spasm  Abnormal posture  Other idiopathic scoliosis, thoracolumbar region     Problem List Patient Active Problem List   Diagnosis Date Noted  . Sty, external 05/14/2019  . Plantar fasciitis, bilateral 07/19/2017  . Sebaceous  cyst of right axilla 07/19/2017  . Overweight (BMI 25.0-29.9) 04/28/2016  . Migraines 04/04/2016   Madison Randall, SPT  Cleophus Molt 06/25/2019, 4:16 PM  [facility] MAIN [facility] SERVICES [address] [place], [place], [address] Phone: [phone]   Fax:  [phone]  Name: Madison Randall MRN: [id number] Date of Birth: [birth year]

## 2019-07-02 ENCOUNTER — Ambulatory Visit: Payer: Medicaid Other

## 2019-07-02 ENCOUNTER — Other Ambulatory Visit: Payer: Self-pay

## 2019-07-02 DIAGNOSIS — M62838 Other muscle spasm: Secondary | ICD-10-CM

## 2019-07-02 DIAGNOSIS — M4125 Other idiopathic scoliosis, thoracolumbar region: Secondary | ICD-10-CM

## 2019-07-02 DIAGNOSIS — R293 Abnormal posture: Secondary | ICD-10-CM

## 2019-07-02 NOTE — Patient Instructions (Addendum)
Chin tucks  - perform while laying down and/ or while in a seated position such as while you're nursing.  Perform 2 sets of 10, with 5 second holds   While feeding baby and looking down, do a chin tuck prior. Continue to make sure you are well supported at your back and lap so less effort is being done by back and arms.     Bracing With Bridging (Hook-Lying)    With neutral spine, tighten pelvic floor and abdominals and hold. Breath out and then lift bottom. Repeat __10x, hold 1 second_ times. Do 1-2x times a day.

## 2019-07-02 NOTE — Therapy (Signed)
[facility] MAIN [facility] [address] [place], [place], [address] Phone: [phone]   Fax:  [phone]  Physical Therapy Treatment The patient has been informed of current processes in place at Outpatient Rehab to protect patients from Covid-19 exposure including social distancing, schedule modifications, and new cleaning procedures. After discussing their particular risk with a therapist based on the patient's personal risk factors, the patient has decided to proceed with in-person therapy.  Patient Details  Name: Madison Randall MRN: [id number] Date of Birth: 03-28-1986 No data recorded  Encounter Date: 07/02/2019  PT End of Session - 07/02/19 1453    Visit Number  4    Number of Visits  13    Date for PT Re-Evaluation  09/03/19    Authorization Type  MCA[employer]    Authorization Time Period  10 visits starting on 10-6    Authorization - Visit Number  4    Authorization - Number of Visits  13    PT Start Time  1335    PT Stop Time  1440    PT Time Calculation (min)  65 min    Activity Tolerance  Patient tolerated treatment well;No increased pain    Behavior During Therapy  WFL for tasks assessed/performed       Past Medical History:  Diagnosis Date  . Amenorrhea   . Migraine   . Migraines     Past Surgical History:  Procedure Laterality Date  . DILATION AND CURETTAGE OF UTERUS  09/2014 uterine infection form delivery 2015    There were no vitals filed for this visit.     Pelvic Floor Physical Therapy Treatment Note  SCREENING  Changes in medications, allergies, or medical history?: No    SUBJECTIVE  Patient reports: Has been walking since previous PT- not feeling like she has a "peg leg" any more. No increase in R hip pain. No reverting back. Can't sit on it anymore though. Takes more than 10 minutes if sitting upright and with good posture.   Neck and shoulders- some days feel ok. And for over half the day  6-7ish/10 neck and shoulder pain. (R>L) points to low neck and upper shoulder. 2-3/10 currently. Looking to the L aggitates R neck/shoulder pain.   Precautions:  13 weeks postpartum   Pain update:  Location of pain: R hip (most significant at anterior hip)  Current pain:  3/10  Max pain:  5/10 Least pain:  3 /10 Nature of pain: burning when sitting criss cross after 10 minutes; no "glitches" with increased pain this week. Removes self from sitting position and reduces after a few minutes.   *no neck pain following session  Patient Goals: #1 goal- would to be to feel better in the pelvic area. Scared to be initiate with husband again. Still feels a bulge. Would like to be intimate 3x/week vs 1x/week.    OBJECTIVE  Changes in: Posture/Observations:  Anterior pelvic tilt in sitting and standing  Decreased R cervical rotation    Range of Motion/Flexibilty:  Hypomobility at T/L junction   Strength/MMT:  LE MMT: NA   Pelvic floor: R IC TTP   Abdominal:  decreased rib excursion bilaterally   Palpation: TTP to B hip adductors  TTP B neck extensors, suboccipitals    Gait Analysis: No R hip circumduction during pre session.   INTERVENTIONS THIS SESSION: Manual Therapy: Performed  TP release to B suboccipital release, R scalenes and R UT to decrease spasm and pain  and allow for improved balance of musculature for improved function and decreased symptoms- severe R neck pain. Relieved pain following manual therapy. Skin rolling for myofascial release at B hip adductors to decreased sensitization. (43 minutes)    Dry-needle: performed TPDN with a .30x83mm needle and a .30x34mm needle as described below to decrease spasm and pain and allow for improved balance of musculature for improved function and decreased symptoms at B adductor muscles.   Therex: Supine chin tucks, progressed to sitting chin tucks for deep anterior neck flexors for neutral cervical spine posture when  breastfeeding.  Eduction on bridges for [employer].  (10 minutes)   Total time: 65 minutes         PT Short Term Goals - 06/25/19 1346      PT SHORT TERM GOAL #1   Title  Patient will demonstrate improved pelvic alignment and balance of musculature surrounding the pelvis to facilitate decreased PFM spasms and decrease pelvic pain.    Baseline  posterior pelvic tilt, R rib shift, R scoliosis, spasms surrounding pelvis. 11-3 decreased R rib shift, decreased posterior pelvic tilt.    Time  5    Period  Weeks    Status  On-going    Target Date  07/30/19      PT SHORT TERM GOAL #2   Title  Patient will demonstrate HEP x1 in the clinic to demonstrate understanding and proper form to allow for further improvement.    Baseline  Pt. lacks knowledge of therapeutic exercises that can help decrease her Sx. 11-3: pt compliance with HEP    Time  5    Period  Weeks    Status  On-going    Target Date  07/30/19      PT SHORT TERM GOAL #3   Title  Patient will demonstrate improved sitting and standing posture to demonstrate learning and decrease stress on the pelvic floor with functional activity.    Baseline  posterior pelvic tilt, R rib-shift; 11-3 decreaesd posterior pelvic tilt, improved R rib shift and improved.    Time  5    Period  Weeks    Status  On-going    Target Date  07/30/19      PT SHORT TERM GOAL #4   Title  Patient will demonstrate a coordinated contraction, relaxation, and bulge of the pelvic floor muscles to demonstrate functional recruitment and motion and allow for further strengthening.    Baseline  Pt. demonstrates decreased recruitment and coordination of the PFM evidenced by her POP symptoms. 11-3 improved understanding of diphgragmatic breathing in relaxationship to PFM.    Time  5    Period  Weeks    Status  On-going    Target Date  07/30/19        PT Long Term Goals - 06/25/19 1611      PT LONG TERM GOAL #1   Title  Patient will describe feeling of pressure no  more than 5% of the time over the course of the past week to demonstrate improved recruitment and strength of the pelvic floor.    Baseline  Pt. having increased POP sensation with walking for exercise, having a BM, sneezing, etc.    Time  10    Period  Weeks    Status  On-going    Target Date  09/03/19      PT LONG TERM GOAL #2   Title  Patient will score at or below 32/300 on the PFDI, 25% on the Female  NIH-CPSI and 10/35 on the NDI to demonstrate a clinically meaningful decrease in disability and distress due to pelvic floor dysfunction.    Baseline  PFDI: 77/300, NDI: 19/35, Female NIH-CPSI: 23/43 (53%)    Time  10    Period  Weeks    Status  On-going    Target Date  09/03/19      PT LONG TERM GOAL #3   Title  Patient will report no episodes of SUI over the course of the prior two weeks to demonstrate improved functional ability.    Baseline  Having SUI with cough, sneeze, etc.    Time  10    Period  Weeks    Status  On-going    Target Date  09/03/19      PT LONG TERM GOAL #4   Title  Patient will describe pain no greater than 1/10 during laying down to demonstrate improved ability to sleep for overall health.    Baseline  Pain as high as 10/10, worst when laying down in the neck, shoulders, r hip, (and migraines)    Time  10    Period  Weeks    Status  On-going    Target Date  09/03/19            Plan - 07/02/19 1453    Clinical Impression Statement  Pt. Responded well to all interventions today, demonstrating resolution of neck pain, decreased hip adductor muscle spasms, as well as understanding and correct performance of all education and exercises provided today. They will continue to benefit from skilled physical therapy to work toward remaining goals and maximize function as well as decrease likelihood of symptom increase or recurrence.    Personal Factors and Comorbidities  Comorbidity 3+    Comorbidities  Migraines, Plantar Fasciitis, Scoliosis     Examination-Activity Limitations  Sit;Sleep;Lift;Squat;Bend;Caring for Others;Locomotion Level;Stand;Transfers;Toileting    Examination-Participation Restrictions  Interpersonal Relationship;Community Activity;Shop;Driving;Meal Prep    Stability/Clinical Decision Making  Unstable/Unpredictable    Rehab Potential  Good    PT Frequency  1x / week    PT Duration  Other (comment)   10 weeks   PT Treatment/Interventions  ADLs/Self Care Home Management;Biofeedback;Electrical Stimulation;Moist Heat;Traction;Gait training;Functional mobility Scientist, forensic;Therapeutic activities;Therapeutic exercise;Balance training;Neuromuscular re-education;Patient/family education;Manual techniques;Scar mobilization;Passive range of motion;Taping;Dry needling;Visual/perceptual remediation/compensation;Spinal Manipulations;Joint Manipulations    PT Next Visit Plan  reassess pelvic alignment; DN v TP release; assess external/internal PF musculature; teach self or partner STM techniques; glute med stability; scapular retraction/depression    PT Home Exercise Plan  R rib shift correction; diaphgramatic breathing; R QL stretch; log rolling and STS edu; side plank; tennis ball TP release; Prone hip-flexor stretch/hold-release; seated and hooklying chin tucks; bridges    Consulted and Agree with Plan of Care  Patient       Patient will benefit from skilled therapeutic intervention in order to improve the following deficits and impairments:  Decreased balance, Decreased endurance, Difficulty walking, Increased muscle spasms, Improper body mechanics, Decreased activity tolerance, Decreased coordination, Decreased strength, Increased fascial restricitons, Postural dysfunction, Pain  Visit Diagnosis: Other muscle spasm  Abnormal posture  Other idiopathic scoliosis, thoracolumbar region     Problem List Patient Active Problem List   Diagnosis Date Noted  . Sty, external 05/14/2019  . Plantar fasciitis,  bilateral 07/19/2017  . Sebaceous cyst of right axilla 07/19/2017  . Overweight (BMI 25.0-29.9) 04/28/2016  . Migraines 04/04/2016   Madison Randall, SPT  Madison Randall 07/02/2019, 3:26 PM  [place]  CENTER MAIN [facility] SERVICES [address] [place], [place], [address] Phone: [phone]   Fax:  [phone]  Name: Madison Randall MRN: [id number] Date of Birth: [birth year]

## 2019-07-09 ENCOUNTER — Ambulatory Visit: Payer: Medicaid Other

## 2019-07-09 ENCOUNTER — Other Ambulatory Visit: Payer: Self-pay

## 2019-07-09 DIAGNOSIS — R293 Abnormal posture: Secondary | ICD-10-CM

## 2019-07-09 DIAGNOSIS — M62838 Other muscle spasm: Secondary | ICD-10-CM

## 2019-07-09 DIAGNOSIS — M4125 Other idiopathic scoliosis, thoracolumbar region: Secondary | ICD-10-CM

## 2019-07-09 NOTE — Therapy (Signed)
[place] MAIN [facility] SERVICES [address] [place], [place], [address] Phone: [phone]   Fax:  [phone]  Physical Therapy Treatment The patient has been informed of current processes in place at Outpatient Rehab to protect patients from Covid-19 exposure including social distancing, schedule modifications, and new cleaning procedures. After discussing their particular risk with a therapist based on the patient's personal risk factors, the patient has decided to proceed with in-person therapy.  Patient Details  Name: Madison Randall MRN: [id number] Date of Birth: Jul 25, 1986 No data recorded  Encounter Date: 07/09/2019  PT End of Session - 07/09/19 1503    Visit Number  5    Number of Visits  13    Date for PT Re-Evaluation  09/03/19    Authorization Type  MCAID    Authorization Time Period  10 visits starting on 10-6    Authorization - Visit Number  5    Authorization - Number of Visits  13    PT Start Time  9924    PT Stop Time  1450    PT Time Calculation (min)  75 min    Activity Tolerance  Patient tolerated treatment well;No increased pain    Behavior During Therapy  WFL for tasks assessed/performed       Past Medical History:  Diagnosis Date  . Amenorrhea   . Migraine   . Migraines     Past Surgical History:  Procedure Laterality Date  . DILATION AND CURETTAGE OF UTERUS  09/2014 uterine infection form delivery 2015    There were no vitals filed for this visit.      Pelvic Floor Physical Therapy Treatment Note  SCREENING  Changes in medications, allergies, or medical history?: No    SUBJECTIVE  Patient reports: Patient made a 60% improvement in sexual function (decreased pain with intercourse) and morning after urination (feels weird). The day after- urinating- 3x it felt "weird" however it used to be 60% worse the day after.   Sunday stood in the kitchen for 3-4 hours, and standing also irritates the hip.  Heating pad afterwards helped. Burning sensation all along lateral hip/groin- Lateral cutaneous femoral nerve pattern.   Instability feeling/ discomfort in vagina with step up and hip ER. Feels like something is going to fall out.   Head and neck pain- much better this week. Has been "very" compliant.    Precautions:  14 weeks postpartum   Pain update:  Location of pain: R hip (most significant at anterior hip)  Current pain:  4/10  Max pain:  8/10 Least pain:  4 /10 Nature of pain: burning sensation- lateral cutaneous femoral nerve   3/10 on NPS post session    Patient Goals: #1 goal- would to be to feel better in the pelvic area. Scared to be initiate with husband again. Still feels a bulge. Would like to be intimate 3x/week vs 1x/week.    OBJECTIVE  Changes in: Posture/Observations:  Anterior pelvic tilt in sitting and standing  Decreased R cervical rotation    Range of Motion/Flexibilty:  Hypomobility at T/L junction   Strength/MMT:  LE MMT: NA   Pelvic floor: R IC TTP   Abdominal:  decreased rib excursion bilaterally   Palpation: TTP to B hip adductors TTP to B OI, PC, anterior PR   Gait Analysis: No R hip circumduction during pre session.   INTERVENTIONS THIS SESSION: Manual Therapy: Internal PFM assessment, muscles spasms noted in B OI, pubococcygeus and anterior puborectalis  that decreased with manual trigger point release, to decrease spasm and pain and allow for improved balance of musculature for improved function and decreased symptoms. R myofascial release (MWM- hook-lying posterior pelvic tilts) along lateral cutaneous femoral nerve. (67 minutes)   Therex: R sided hip flexor stretch following manual therapy (8 minutes)   Total time: 75 minutes         PT Short Term Goals - 06/25/19 1346      PT SHORT TERM GOAL #1   Title  Patient will demonstrate improved pelvic alignment and balance of musculature surrounding the pelvis to facilitate  decreased PFM spasms and decrease pelvic pain.    Baseline  posterior pelvic tilt, R rib shift, R scoliosis, spasms surrounding pelvis. 11-3 decreased R rib shift, decreased posterior pelvic tilt.    Time  5    Period  Weeks    Status  On-going    Target Date  07/30/19      PT SHORT TERM GOAL #2   Title  Patient will demonstrate HEP x1 in the clinic to demonstrate understanding and proper form to allow for further improvement.    Baseline  Pt. lacks knowledge of therapeutic exercises that can help decrease her Sx. 11-3: pt compliance with HEP    Time  5    Period  Weeks    Status  On-going    Target Date  07/30/19      PT SHORT TERM GOAL #3   Title  Patient will demonstrate improved sitting and standing posture to demonstrate learning and decrease stress on the pelvic floor with functional activity.    Baseline  posterior pelvic tilt, R rib-shift; 11-3 decreaesd posterior pelvic tilt, improved R rib shift and improved.    Time  5    Period  Weeks    Status  On-going    Target Date  07/30/19      PT SHORT TERM GOAL #4   Title  Patient will demonstrate a coordinated contraction, relaxation, and bulge of the pelvic floor muscles to demonstrate functional recruitment and motion and allow for further strengthening.    Baseline  Pt. demonstrates decreased recruitment and coordination of the PFM evidenced by her POP symptoms. 11-3 improved understanding of diphgragmatic breathing in relaxationship to PFM.    Time  5    Period  Weeks    Status  On-going    Target Date  07/30/19        PT Long Term Goals - 06/25/19 1611      PT LONG TERM GOAL #1   Title  Patient will describe feeling of pressure no more than 5% of the time over the course of the past week to demonstrate improved recruitment and strength of the pelvic floor.    Baseline  Pt. having increased POP sensation with walking for exercise, having a BM, sneezing, etc.    Time  10    Period  Weeks    Status  On-going    Target  Date  09/03/19      PT LONG TERM GOAL #2   Title  Patient will score at or below 32/300 on the PFDI, 25% on the Female NIH-CPSI and 10/35 on the NDI to demonstrate a clinically meaningful decrease in disability and distress due to pelvic floor dysfunction.    Baseline  PFDI: 77/300, NDI: 19/35, Female NIH-CPSI: 23/43 (53%)    Time  10    Period  Weeks    Status  On-going  Target Date  09/03/19      PT LONG TERM GOAL #3   Title  Patient will report no episodes of SUI over the course of the prior two weeks to demonstrate improved functional ability.    Baseline  Having SUI with cough, sneeze, etc.    Time  10    Period  Weeks    Status  On-going    Target Date  09/03/19      PT LONG TERM GOAL #4   Title  Patient will describe pain no greater than 1/10 during laying down to demonstrate improved ability to sleep for overall health.    Baseline  Pain as high as 10/10, worst when laying down in the neck, shoulders, r hip, (and migraines)    Time  10    Period  Weeks    Status  On-going    Target Date  09/03/19            Plan - 07/09/19 1501    Clinical Impression Statement  Pt. Responded well to all interventions today, demonstrating improved pain levels at R hip, decreased internal PFM spasms and decreased myofascial tension along lateral cutaneous femoral nerve root pattern, as well as understanding and correct performance of all education and exercises provided today. They will continue to benefit from skilled physical therapy to work toward remaining goals and maximize function as well as decrease likelihood of symptom increase or recurrence.    Personal Factors and Comorbidities  Comorbidity 3+    Comorbidities  Migraines, Plantar Fasciitis, Scoliosis    Examination-Activity Limitations  Sit;Sleep;Lift;Squat;Bend;Caring for Others;Locomotion Level;Stand;Transfers;Toileting    Examination-Participation Restrictions  Interpersonal Relationship;Community  Activity;Shop;Driving;Meal Prep    Stability/Clinical Decision Making  Unstable/Unpredictable    Rehab Potential  Good    PT Frequency  1x / week    PT Duration  Other (comment)   10 weeks   PT Treatment/Interventions  ADLs/Self Care Home Management;Biofeedback;Electrical Stimulation;Moist Heat;Traction;Gait training;Functional mobility Network engineer;Therapeutic activities;Therapeutic exercise;Balance training;Neuromuscular re-education;Patient/family education;Manual techniques;Scar mobilization;Passive range of motion;Taping;Dry needling;Visual/perceptual remediation/compensation;Spinal Manipulations;Joint Manipulations    PT Next Visit Plan  ** standing pec stretch R>L; prolapse with bearing down- symptom recreation?; give pre-squeeze and sneeze; reassess pelvic alignment; DN v TP release; assess external/internal PF musculature; teach self or partner STM techniques; glute med stability; scapular retraction/depression    PT Home Exercise Plan  R rib shift correction; diaphgramatic breathing; R QL stretch; log rolling and STS edu; side plank; tennis ball TP release; Prone hip-flexor stretch/hold-release; seated and hooklying chin tucks; bridges; given info on internal release and self infernal release materials    Consulted and Agree with Plan of Care  Patient       Patient will benefit from skilled therapeutic intervention in order to improve the following deficits and impairments:  Decreased balance, Decreased endurance, Difficulty walking, Increased muscle spasms, Improper body mechanics, Decreased activity tolerance, Decreased coordination, Decreased strength, Increased fascial restricitons, Postural dysfunction, Pain  Visit Diagnosis: Other muscle spasm  Abnormal posture  Other idiopathic scoliosis, thoracolumbar region     Problem List Patient Active Problem List   Diagnosis Date Noted  . Sty, external 05/14/2019  . Plantar fasciitis, bilateral 07/19/2017  . Sebaceous  cyst of right axilla 07/19/2017  . Overweight (BMI 25.0-29.9) 04/28/2016  . Migraines 04/04/2016   Percilla Tweten, SPT  Annalysa Mohammad 07/09/2019, 3:05 PM  [place] [facility] MAIN [facility] SERVICES [address] [place], [place], [address] Phone: [phone]   Fax:  [phone]  Name: Madison Randall MRN: [id number] Date of Birth: 28-Nov-1985

## 2019-07-09 NOTE — Patient Instructions (Signed)
Self External Trigger Point Relief    1) Wash your hands and prop yourself up in a way where you can easily reach the vagina. You may wish to have a small hand-held mirror near by.  2) Use the 2 middle fingers to put gentle pressure on the three external pelvic floor muscles and hold pressure and take deep breaths as you allow the tension to release and discomfort to dissipate   3) Repeat the process for any trigger points you find spending between 3-10 minutes on this every 1-2 days until you do not find any more trigger points or you are told otherwise by your therapist.  Intimate Rose internal trigger point release tool $29.99$59.99  Dr. Joel Kaplan Premium Prostate Massager   $19.99  

## 2019-07-16 ENCOUNTER — Other Ambulatory Visit: Payer: Self-pay

## 2019-07-16 ENCOUNTER — Ambulatory Visit: Payer: Medicaid Other

## 2019-07-16 DIAGNOSIS — M4125 Other idiopathic scoliosis, thoracolumbar region: Secondary | ICD-10-CM

## 2019-07-16 DIAGNOSIS — R293 Abnormal posture: Secondary | ICD-10-CM

## 2019-07-16 DIAGNOSIS — M62838 Other muscle spasm: Secondary | ICD-10-CM

## 2019-07-16 NOTE — Therapy (Addendum)
[facility] MAIN [facility] SERVICES [address] [place], [place], [address] Phone: [phone]   Fax:  [phone]  Physical Therapy Treatment  The patient has been informed of current processes in place at Outpatient Rehab to protect patients from Covid-19 exposure including social distancing, schedule modifications, and new cleaning procedures. After discussing their particular risk with a therapist based on the patient's personal risk factors, the patient has decided to proceed with in-person therapy.   Patient Details  Name: Madison Randall MRN: [id number] Date of Birth: 1985-10-22 No data recorded  Encounter Date: 07/16/2019  PT End of Session - 07/23/19 1025    Visit Number  6    Number of Visits  13    Date for PT Re-Evaluation  09/03/19    Authorization Type  MCA[employer]    Authorization Time Period  10 visits starting on 10-6    Authorization - Visit Number  6    Authorization - Number of Visits  13    PT Start Time  1330    PT Stop Time  1430    PT Time Calculation (min)  60 min    Activity Tolerance  Patient tolerated treatment well;No increased pain    Behavior During Therapy  WFL for tasks assessed/performed       Past Medical History:  Diagnosis Date  . Amenorrhea   . Migraine   . Migraines     Past Surgical History:  Procedure Laterality Date  . DILATION AND CURETTAGE OF UTERUS  09/2014 uterine infection form delivery 2015    There were no vitals filed for this visit.    Pelvic Floor Physical Therapy Treatment Note  SCREENING  Changes in medications, allergies, or medical history?: No    SUBJECTIVE  Patient reports: Has not been sexually active so she does not know if she will have pain with intercourse yet. Will try again next week. Her 10th anniversary was Friday. She went on a short walk in the park on Sunday in comfortable flats and her foot has been hurting now. R hip is numb/burning feeling with  sitting   Precautions:  16 weeks postpartum   Pain update:  Location of pain: R hip (L foot) Current pain:  2/10 (7/10 with walking) Max pain:  8/10 (7) Least pain:  4 /10 (0 when sitting) Nature of pain: burning sensation- lateral cutaneous femoral nerve   /10 on NPS post session    Patient Goals: #1 goal- would to be to feel better in the pelvic area. Scared to be initiate with husband again. Still feels a bulge. Would like to be intimate 3x/week vs 1x/week.    OBJECTIVE  Changes in: Posture/Observations:  Not weight bearing well through LLE due to foot pain   Range of Motion/Flexibilty:  Decreased mid-foot mobility in L foot  Strength/MMT:  LE MMT: NA   Pelvic floor: R IC TTP   Abdominal:  decreased rib excursion bilaterally (from previous session)  Palpation: Decreased fascial mobility through R lateral thigh  Gait Analysis: No R hip circumduction during pre session.   INTERVENTIONS THIS SESSION: Manual: Performed grade 3-4 mobs at mid-foot with foot moving into eversion to improve mobility and decrease pain. Performed grade 2-3 posterior/superiorly directed mobs to L innominate to improve alignment and decrease chance of spasms returning. Performed MFR to R lateral thigh (upper 1/3) to decrease pain and burning.   Theract:educated on pre-squeeze and sneeze technique to decrease SUI by improving timing of the PFM.  Therex:Educated on and practiced doorway chest stretch To maintain and improve muscle length and allow for improved balance of musculature for long-term symptom relief and scapular retraction/depressions to improve posture and allow for improved pelvic tilt for optimal PFM length-tension relationship and function.   Total time: 60 minutes                             PT Short Term Goals - 06/25/19 1346      PT SHORT TERM GOAL #1   Title  Patient will demonstrate improved pelvic alignment and balance of musculature  surrounding the pelvis to facilitate decreased PFM spasms and decrease pelvic pain.    Baseline  posterior pelvic tilt, R rib shift, R scoliosis, spasms surrounding pelvis. 11-3 decreased R rib shift, decreased posterior pelvic tilt.    Time  5    Period  Weeks    Status  On-going    Target Date  07/30/19      PT SHORT TERM GOAL #2   Title  Patient will demonstrate HEP x1 in the clinic to demonstrate understanding and proper form to allow for further improvement.    Baseline  Pt. lacks knowledge of therapeutic exercises that can help decrease her Sx. 11-3: pt compliance with HEP    Time  5    Period  Weeks    Status  On-going    Target Date  07/30/19      PT SHORT TERM GOAL #3   Title  Patient will demonstrate improved sitting and standing posture to demonstrate learning and decrease stress on the pelvic floor with functional activity.    Baseline  posterior pelvic tilt, R rib-shift; 11-3 decreaesd posterior pelvic tilt, improved R rib shift and improved.    Time  5    Period  Weeks    Status  On-going    Target Date  07/30/19      PT SHORT TERM GOAL #4   Title  Patient will demonstrate a coordinated contraction, relaxation, and bulge of the pelvic floor muscles to demonstrate functional recruitment and motion and allow for further strengthening.    Baseline  Pt. demonstrates decreased recruitment and coordination of the PFM evidenced by her POP symptoms. 11-3 improved understanding of diphgragmatic breathing in relaxationship to PFM.    Time  5    Period  Weeks    Status  On-going    Target Date  07/30/19        PT Long Term Goals - 06/25/19 1611      PT LONG TERM GOAL #1   Title  Patient will describe feeling of pressure no more than 5% of the time over the course of the past week to demonstrate improved recruitment and strength of the pelvic floor.    Baseline  Pt. having increased POP sensation with walking for exercise, having a BM, sneezing, etc.    Time  10    Period   Weeks    Status  On-going    Target Date  09/03/19      PT LONG TERM GOAL #2   Title  Patient will score at or below 32/300 on the PFDI, 25% on the Female NIH-CPSI and 10/35 on the NDI to demonstrate a clinically meaningful decrease in disability and distress due to pelvic floor dysfunction.    Baseline  PFDI: 77/300, NDI: 19/35, Female NIH-CPSI: 23/43 (53%)    Time  10    Period  Weeks    Status  On-going    Target Date  09/03/19      PT LONG TERM GOAL #3   Title  Patient will report no episodes of SUI over the course of the prior two weeks to demonstrate improved functional ability.    Baseline  Having SUI with cough, sneeze, etc.    Time  10    Period  Weeks    Status  On-going    Target Date  09/03/19      PT LONG TERM GOAL #4   Title  Patient will describe pain no greater than 1/10 during laying down to demonstrate improved ability to sleep for overall health.    Baseline  Pain as high as 10/10, worst when laying down in the neck, shoulders, r hip, (and migraines)    Time  10    Period  Weeks    Status  On-going    Target Date  09/03/19            Plan - 07/23/19 1025    Clinical Impression Statement  Pt. Responded well to all interventions today, demonstrating improved ability to WB through LLE and increased mid-foot mobility with decreased pain, improved fascial mobility through R thigh, improved pelvic alignment, as well as understanding and correct performance of all education and exercises provided today. They will continue to benefit from skilled physical therapy to work toward remaining goals and maximize function as well as decrease likelihood of symptom increase or recurrence.     Personal Factors and Comorbidities  Comorbidity 3+    Comorbidities  Migraines, Plantar Fasciitis, Scoliosis    Examination-Activity Limitations  Sit;Sleep;Lift;Squat;Bend;Caring for Others;Locomotion Level;Stand;Transfers;Toileting    Examination-Participation Restrictions   Interpersonal Relationship;Community Activity;Shop;Driving;Meal Prep    Stability/Clinical Decision Making  Unstable/Unpredictable    Rehab Potential  Good    PT Frequency  1x / week    PT Duration  Other (comment)   10 weeks   PT Treatment/Interventions  ADLs/Self Care Home Management;Biofeedback;Electrical Stimulation;Moist Heat;Traction;Gait training;Functional mobility Scientist, forensic;Therapeutic activities;Therapeutic exercise;Balance training;Neuromuscular re-education;Patient/family education;Manual techniques;Scar mobilization;Passive range of motion;Taping;Dry needling;Visual/perceptual remediation/compensation;Spinal Manipulations;Joint Manipulations    PT Next Visit Plan  prolapse with bearing down- symptom recreation?; reassess pelvic alignment; DN v TP release; teach self or partner STM techniques PRN; glute med stability; scapular retraction/depression    PT Home Exercise Plan  R rib shift correction; diaphgramatic breathing; R QL stretch; log rolling and STS edu; side plank; tennis ball TP release; Prone hip-flexor stretch/hold-release; seated and hooklying chin tucks; bridges; given info on internal release and self infernal release materials, pre-squeeze and sneeze, chest stretch, scapular retraction/depressios.    Consulted and Agree with Plan of Care  Patient       Patient will benefit from skilled therapeutic intervention in order to improve the following deficits and impairments:  Decreased balance, Decreased endurance, Difficulty walking, Increased muscle spasms, Improper body mechanics, Decreased activity tolerance, Decreased coordination, Decreased strength, Increased fascial restricitons, Postural dysfunction, Pain  Visit Diagnosis: Other muscle spasm  Abnormal posture  Other idiopathic scoliosis, thoracolumbar region     Problem List Patient Active Problem List   Diagnosis Date Noted  . Sty, external 05/14/2019  . Plantar fasciitis, bilateral 07/19/2017   . Sebaceous cyst of right axilla 07/19/2017  . Overweight (BMI 25.0-29.9) 04/28/2016  . Migraines 04/04/2016   Willa Rough DPT, ATC Willa Rough 07/23/2019, 10:50 AM  [place] MAIN [facility] SERVICES [place], [place],  [id number] Phone: [phone]   Fax:  [phone]  Name: Madison Randall MRN: [id number] Date of Birth: 1986-05-18

## 2019-07-16 NOTE — Patient Instructions (Signed)
"  Pre-squeeze and sneeze"  Before you **blow your nose** cough, sneeze, laugh etc. "pre-squeeze" the pelvic floor (kegel) and hold it until you finish coughing to retrain the muscles to hold the urine in during these activities.    CHEST: Doorway, Bilateral - Standing   Both arms if narrow door                       Single arm for wide door  Standing in doorway, place hands and elbows on wall with elbows bent at shoulder height, shoulders down away from ears. Lean forward. Hold for_5_ breaths, repeat 2-3 times, 1-2 times per day.   Shoulder Retraction and Downward Rotation   Rotate the shoulder blades back and down as if you had to hold a pencil between them, holding for 1 full second each time. Repeat this _10x2_ times __ times per day.

## 2019-07-23 ENCOUNTER — Other Ambulatory Visit: Payer: Self-pay

## 2019-07-23 ENCOUNTER — Ambulatory Visit: Payer: Medicaid Other | Attending: Obstetrics and Gynecology

## 2019-07-23 DIAGNOSIS — M4125 Other idiopathic scoliosis, thoracolumbar region: Secondary | ICD-10-CM

## 2019-07-23 DIAGNOSIS — R293 Abnormal posture: Secondary | ICD-10-CM | POA: Diagnosis present

## 2019-07-23 DIAGNOSIS — M62838 Other muscle spasm: Secondary | ICD-10-CM | POA: Diagnosis not present

## 2019-07-23 NOTE — Patient Instructions (Addendum)
Intimate Rose internal trigger point release tool [phone]  Dr. Kathline Magic Premium Prostate Massager   [phone] Pelvic Tilt With Pelvic Floor (Hook-Lying)    Do 2 sets of 15 tilts per day. Breathe in when you tilt forward (A) and out when you tuck under (B).  IF ABOVE EXERCISE IS EASY:         Lie with hips and knees bent. Squeeze pelvic floor and flatten low back while breathing out so that pelvis tilts. Repeat _10x2__ times. Do _1-2__ times a day.  Put a pillow under your hips in this position when doing this exercise to help decrease pressure in the pelvis when it feels "heavy" or if you are having increased leakage/symptoms.  Drink extra water and be gently active this afternoon to minimize soreness following dry needling.

## 2019-07-23 NOTE — Therapy (Signed)
[facility] MAIN [facility] SERVICES [address] [place], [place], [address] Phone: [phone]   Fax:  [phone]  Physical Therapy Treatment  The patient has been informed of current processes in place at Outpatient Rehab to protect patients from Covid-19 exposure including social distancing, schedule modifications, and new cleaning procedures. After discussing their particular risk with a therapist based on the patient's personal risk factors, the patient has decided to proceed with in-person therapy.   Patient Details  Name: Madison Randall MRN: [id number] Date of Birth: 01/27/1986 No data recorded  Encounter Date: 07/23/2019  PT End of Session - 07/23/19 1430    Visit Number  7    Number of Visits  13    Date for PT Re-Evaluation  09/03/19    Authorization Type  MCAID    Authorization Time Period  10 visits starting on 10-6    Authorization - Visit Number  7    Authorization - Number of Visits  13    PT Start Time  1333    PT Stop Time  1430    PT Time Calculation (min)  57 min    Activity Tolerance  Patient tolerated treatment well;No increased pain    Behavior During Therapy  WFL for tasks assessed/performed       Past Medical History:  Diagnosis Date  . Amenorrhea   . Migraine   . Migraines     Past Surgical History:  Procedure Laterality Date  . DILATION AND CURETTAGE OF UTERUS  09/2014 uterine infection form delivery 2015    There were no vitals filed for this visit.   Pelvic Floor Physical Therapy Treatment Note  SCREENING  Changes in medications, allergies, or medical history?: No    SUBJECTIVE  Patient reports: L foot is doing better after rolling it on frozen water bottle. Has had intercourse but was incredibly tense and nervous and her pain was so bad she was in the bathroom crying afterward in large part emotionally because she feels broken. Did have pain up to 7/10 and felt "super leaky and wet" afterward.  Has been practicing the pre-squeeze and sneeze both in sitting and standing. Hip is still achy    Precautions:  17 weeks postpartum   Pain update:  Location of pain: R hip Current pain:  0/10  Max pain:  7/10  Least pain:  4 /10  Nature of pain: burning sensation- lateral cutaneous femoral nerve   0/10 on NPS post session    Patient Goals: #1 goal- would to be to feel better in the pelvic area. Scared to be initiate with husband again. Still feels a bulge. Would like to be intimate 3x/week vs 1x/week.    OBJECTIVE  Changes in: Posture/Observations:  Anterior pelvic tilt  Range of Motion/Flexibilty:    Strength/MMT:  LE MMT:   Pelvic floor:  Not assessed today  Abdominal:  Difficulty recruiting TA for pelvic tilts in supine>sitting with dominant oblique firing pattern, improved by ~50% following treatment.  Palpation: TTP to R erector spinae and multifidus Near L1-2 and L4-5 pain referral to tailbone.  Gait Analysis: return to "normal" from antalgic gait pattern.  INTERVENTIONS THIS SESSION: Manual: Performed TP release and MFR to R erector spinae and multifidus Near L1-2 and L4-5 to decrease spasm and pain and allow for improved balance of musculature for improved function, improved ability to recruit deep core, and decreased symptoms.  Dry-needle: Performed TPDN with a .30x62mm needle and standard approach as described  below to decrease spasm and pain and allow for improved balance of musculature for improved function and decreased symptoms.  Therex:Educated on and practiced seated and hook-lying pelvic tilts with TC, VC, and visual cues to improve deep-core recruitment and prevent return of spasms and work toward increased PFM strength.  Total time: 60 minutes                     Trigger Point Dry Needling - 07/24/19 0001    Consent Given?  Yes    Education Handout Provided  No    Muscles Treated Back/Hip  Erector spinae;Lumbar multifidi     Dry Needling Comments  right lumbar    Erector spinae Response  Twitch response elicited;Palpable increased muscle length    Lumbar multifidi Response  Twitch response elicited;Palpable increased muscle length             PT Short Term Goals - 06/25/19 1346      PT SHORT TERM GOAL #1   Title  Patient will demonstrate improved pelvic alignment and balance of musculature surrounding the pelvis to facilitate decreased PFM spasms and decrease pelvic pain.    Baseline  posterior pelvic tilt, R rib shift, R scoliosis, spasms surrounding pelvis. 11-3 decreased R rib shift, decreased posterior pelvic tilt.    Time  5    Period  Weeks    Status  On-going    Target Date  07/30/19      PT SHORT TERM GOAL #2   Title  Patient will demonstrate HEP x1 in the clinic to demonstrate understanding and proper form to allow for further improvement.    Baseline  Pt. lacks knowledge of therapeutic exercises that can help decrease her Sx. 11-3: pt compliance with HEP    Time  5    Period  Weeks    Status  On-going    Target Date  07/30/19      PT SHORT TERM GOAL #3   Title  Patient will demonstrate improved sitting and standing posture to demonstrate learning and decrease stress on the pelvic floor with functional activity.    Baseline  posterior pelvic tilt, R rib-shift; 11-3 decreaesd posterior pelvic tilt, improved R rib shift and improved.    Time  5    Period  Weeks    Status  On-going    Target Date  07/30/19      PT SHORT TERM GOAL #4   Title  Patient will demonstrate a coordinated contraction, relaxation, and bulge of the pelvic floor muscles to demonstrate functional recruitment and motion and allow for further strengthening.    Baseline  Pt. demonstrates decreased recruitment and coordination of the PFM evidenced by her POP symptoms. 11-3 improved understanding of diphgragmatic breathing in relaxationship to PFM.    Time  5    Period  Weeks    Status  On-going    Target Date  07/30/19         PT Long Term Goals - 06/25/19 1611      PT LONG TERM GOAL #1   Title  Patient will describe feeling of pressure no more than 5% of the time over the course of the past week to demonstrate improved recruitment and strength of the pelvic floor.    Baseline  Pt. having increased POP sensation with walking for exercise, having a BM, sneezing, etc.    Time  10    Period  Weeks    Status  On-going  Target Date  09/03/19      PT LONG TERM GOAL #2   Title  Patient will score at or below 32/300 on the PFDI, 25% on the Female NIH-CPSI and 10/35 on the NDI to demonstrate a clinically meaningful decrease in disability and distress due to pelvic floor dysfunction.    Baseline  PFDI: 77/300, NDI: 19/35, Female NIH-CPSI: 23/43 (53%)    Time  10    Period  Weeks    Status  On-going    Target Date  09/03/19      PT LONG TERM GOAL #3   Title  Patient will report no episodes of SUI over the course of the prior two weeks to demonstrate improved functional ability.    Baseline  Having SUI with cough, sneeze, etc.    Time  10    Period  Weeks    Status  On-going    Target Date  09/03/19      PT LONG TERM GOAL #4   Title  Patient will describe pain no greater than 1/10 during laying down to demonstrate improved ability to sleep for overall health.    Baseline  Pain as high as 10/10, worst when laying down in the neck, shoulders, r hip, (and migraines)    Time  10    Period  Weeks    Status  On-going    Target Date  09/03/19            Plan - 07/24/19 0835    Clinical Impression Statement  Pt. Responded well to all interventions today, demonstrating decreased spasms and improved ability to recruit TA/deep core as well as understanding and correct performance of all education and exercises provided today. They will continue to benefit from skilled physical therapy to work toward remaining goals and maximize function as well as decrease likelihood of symptom increase or recurrence.      Personal Factors and Comorbidities  Comorbidity 3+    Comorbidities  Migraines, Plantar Fasciitis, Scoliosis    Examination-Activity Limitations  Sit;Sleep;Lift;Squat;Bend;Caring for Others;Locomotion Level;Stand;Transfers;Toileting    Examination-Participation Restrictions  Interpersonal Relationship;Community Activity;Shop;Driving;Meal Prep    Stability/Clinical Decision Making  Unstable/Unpredictable    Rehab Potential  Good    PT Frequency  1x / week    PT Duration  Other (comment)   10 weeks   PT Treatment/Interventions  ADLs/Self Care Home Management;Biofeedback;Electrical Stimulation;Moist Heat;Traction;Gait training;Functional mobility Network engineer;Therapeutic activities;Therapeutic exercise;Balance training;Neuromuscular re-education;Patient/family education;Manual techniques;Scar mobilization;Passive range of motion;Taping;Dry needling;Visual/perceptual remediation/compensation;Spinal Manipulations;Joint Manipulations    PT Next Visit Plan  Review pelvic tilts and progress PRN with pillow, prolapse with bearing down- symptom recreation?; reassess pelvic alignment; DN v TP release; teach self or partner STM techniques PRN; glute med stability;    PT Home Exercise Plan  R rib shift correction; diaphgramatic breathing; R QL stretch; log rolling and STS edu; side plank; tennis ball TP release; Prone hip-flexor stretch/hold-release; seated and hooklying chin tucks; bridges; given info on internal release and self infernal release materials, pre-squeeze and sneeze, chest stretch, scapular retraction/depression, seated and supine pelvic tilts    Consulted and Agree with Plan of Care  Patient       Patient will benefit from skilled therapeutic intervention in order to improve the following deficits and impairments:  Decreased balance, Decreased endurance, Difficulty walking, Increased muscle spasms, Improper body mechanics, Decreased activity tolerance, Decreased coordination, Decreased  strength, Increased fascial restricitons, Postural dysfunction, Pain  Visit Diagnosis: Other muscle spasm  Abnormal posture  Other idiopathic scoliosis,  thoracolumbar region     Problem List Patient Active Problem List   Diagnosis Date Noted  . Sty, external 05/14/2019  . Plantar fasciitis, bilateral 07/19/2017  . Sebaceous cyst of right axilla 07/19/2017  . Overweight (BMI 25.0-29.9) 04/28/2016  . Migraines 04/04/2016   Cleophus MoltKeeli T. Nikeisha Klutz DPT, ATC Cleophus MoltKeeli T Andon Villard 07/24/2019, 9:02 AM  [facility] [address] [place], [place], [address] Phone: [phone]   Fax:  [phone]  Name: Vinnie LevelKendra T Lumm MRN: [id number] Date of Birth: [birth year]

## 2019-07-30 ENCOUNTER — Ambulatory Visit: Payer: Medicaid Other

## 2019-08-06 ENCOUNTER — Other Ambulatory Visit: Payer: Self-pay

## 2019-08-06 ENCOUNTER — Ambulatory Visit: Payer: Medicaid Other

## 2019-08-06 DIAGNOSIS — M4125 Other idiopathic scoliosis, thoracolumbar region: Secondary | ICD-10-CM

## 2019-08-06 DIAGNOSIS — M62838 Other muscle spasm: Secondary | ICD-10-CM

## 2019-08-06 DIAGNOSIS — R293 Abnormal posture: Secondary | ICD-10-CM

## 2019-08-06 NOTE — Therapy (Signed)
[facility] MAIN [facility] SERVICES [address] [place], [place], [address] Phone: [phone]   Fax:  [phone]  Physical Therapy Treatment  The patient has been informed of current processes in place at Outpatient Rehab to protect patients from Covid-19 exposure including social distancing, schedule modifications, and new cleaning procedures. After discussing their particular risk with a therapist based on the patient's personal risk factors, the patient has decided to proceed with in-person therapy.   Patient Details  Name: Madison Randall MRN: [id number] Date of Birth: April 07, 1986 No data recorded  Encounter Date: 08/06/2019  PT End of Session - 08/06/19 1351    Visit Number  8    Number of Visits  13    Date for PT Re-Evaluation  09/03/19    Authorization Type  MCAID    Authorization Time Period  10 visits starting on 10-6    Authorization - Visit Number  8    Authorization - Number of Visits  13    PT Start Time  1335    PT Stop Time  1435    PT Time Calculation (min)  60 min    Activity Tolerance  Patient tolerated treatment well;No increased pain    Behavior During Therapy  WFL for tasks assessed/performed       Past Medical History:  Diagnosis Date  . Amenorrhea   . Migraine   . Migraines     Past Surgical History:  Procedure Laterality Date  . DILATION AND CURETTAGE OF UTERUS  09/2014 uterine infection form delivery 2015    There were no vitals filed for this visit.   Pelvic Floor Physical Therapy Treatment Note  SCREENING  Changes in medications, allergies, or medical history?: No    SUBJECTIVE  Patient reports: Pain has been worse lately, hurts with pressure in the R side. Pain with intercourse. Read up on intercourse. Around 4 weeks after her 5th child she had a BM and then had to blow her nose, then felt like she "blew something out of her body" and was able to see a bulge "poking out down there". Her  "discomfort" or heaviness is ~ 7/10 when it is bad. She lives at a 3-4/10. Is afraid to pick up her 33 year old. Still will not blow her nose sitting down. Was "intimate" one time this week and it did not go well, even with a lot of lubricant. Has a hard time feeling the PFM when doing tilts in supine, feels like she does them better in sitting.   Precautions:  17 weeks postpartum   Pain update:  Location of pain: R hip Current pain:  5/10  Max pain:  8/10  Least pain:  4/10  Nature of pain: burning sensation- lateral cutaneous femoral nerve   2/10 on NPS post session    Patient Goals: #1 goal- would to be to feel better in the pelvic area. Scared to be initiate with husband again. Still feels a bulge. Would like to be intimate 3x/week vs 1x/week.    OBJECTIVE  Changes in: Posture/Observations:  Anterior pelvic tilt  Range of Motion/Flexibilty:    Strength/MMT:  LE MMT:   Pelvic floor:  Not assessed today  Abdominal:    Palpation: TTP to R Iliacus and anterolateral proximal hip   Gait Analysis:   INTERVENTIONS THIS SESSION: Manual: Performed TP release to R Iliacus and MFR to R anterior and lateral thigh proximallyto decrease spasm and pain and allow for improved balance of  musculature for improved function, improved ability to recruit deep core, and decreased symptoms.  Dry-needle: Performed TPDN with a .30x57mm needle and standard approach as described below to decrease spasm and pain and allow for improved balance of musculature for improved function and decreased symptoms.  Therex:Educated on and practiced hook-lying pelvic tilts with pillow under hips to build on deep-core activation while using gravity to help decrease feeling of pressure from pelvic organ prolapse and work toward increased PFM strength to decrease impact of POP.  Total time: 60 minutes                         Trigger Point Dry Needling - 08/07/19 0001    Consent Given?   Yes    Education Handout Provided  No    Muscles Treated Back/Hip  Iliacus    Dry Needling Comments  right    Iliacus Response  Twitch response elicited;Palpable increased muscle length             PT Short Term Goals - 06/25/19 1346      PT SHORT TERM GOAL #1   Title  Patient will demonstrate improved pelvic alignment and balance of musculature surrounding the pelvis to facilitate decreased PFM spasms and decrease pelvic pain.    Baseline  posterior pelvic tilt, R rib shift, R scoliosis, spasms surrounding pelvis. 11-3 decreased R rib shift, decreased posterior pelvic tilt.    Time  5    Period  Weeks    Status  On-going    Target Date  07/30/19      PT SHORT TERM GOAL #2   Title  Patient will demonstrate HEP x1 in the clinic to demonstrate understanding and proper form to allow for further improvement.    Baseline  Pt. lacks knowledge of therapeutic exercises that can help decrease her Sx. 11-3: pt compliance with HEP    Time  5    Period  Weeks    Status  On-going    Target Date  07/30/19      PT SHORT TERM GOAL #3   Title  Patient will demonstrate improved sitting and standing posture to demonstrate learning and decrease stress on the pelvic floor with functional activity.    Baseline  posterior pelvic tilt, R rib-shift; 11-3 decreaesd posterior pelvic tilt, improved R rib shift and improved.    Time  5    Period  Weeks    Status  On-going    Target Date  07/30/19      PT SHORT TERM GOAL #4   Title  Patient will demonstrate a coordinated contraction, relaxation, and bulge of the pelvic floor muscles to demonstrate functional recruitment and motion and allow for further strengthening.    Baseline  Pt. demonstrates decreased recruitment and coordination of the PFM evidenced by her POP symptoms. 11-3 improved understanding of diphgragmatic breathing in relaxationship to PFM.    Time  5    Period  Weeks    Status  On-going    Target Date  07/30/19        PT Long  Term Goals - 06/25/19 1611      PT LONG TERM GOAL #1   Title  Patient will describe feeling of pressure no more than 5% of the time over the course of the past week to demonstrate improved recruitment and strength of the pelvic floor.    Baseline  Pt. having increased POP sensation with walking for exercise, having a  BM, sneezing, etc.    Time  10    Period  Weeks    Status  On-going    Target Date  09/03/19      PT LONG TERM GOAL #2   Title  Patient will score at or below 32/300 on the PFDI, 25% on the Female NIH-CPSI and 10/35 on the NDI to demonstrate a clinically meaningful decrease in disability and distress due to pelvic floor dysfunction.    Baseline  PFDI: 77/300, NDI: 19/35, Female NIH-CPSI: 23/43 (53%)    Time  10    Period  Weeks    Status  On-going    Target Date  09/03/19      PT LONG TERM GOAL #3   Title  Patient will report no episodes of SUI over the course of the prior two weeks to demonstrate improved functional ability.    Baseline  Having SUI with cough, sneeze, etc.    Time  10    Period  Weeks    Status  On-going    Target Date  09/03/19      PT LONG TERM GOAL #4   Title  Patient will describe pain no greater than 1/10 during laying down to demonstrate improved ability to sleep for overall health.    Baseline  Pain as high as 10/10, worst when laying down in the neck, shoulders, r hip, (and migraines)    Time  10    Period  Weeks    Status  On-going    Target Date  09/03/19            Plan - 08/07/19 1200    Clinical Impression Statement  Pt. Responded well to all interventions today, demonstrating improved pain from 5/10 to 2/10 and decreased spasm of hip-flexors on R, and decreased sensitivity to R lateral thigh as well as understanding and correct performance of all education and exercises provided today. They will continue to benefit from skilled physical therapy to work toward remaining goals and maximize function as well as decrease likelihood of  symptom increase or recurrence.     Personal Factors and Comorbidities  Comorbidity 3+    Comorbidities  Migraines, Plantar Fasciitis, Scoliosis    Examination-Activity Limitations  Sit;Sleep;Lift;Squat;Bend;Caring for Others;Locomotion Level;Stand;Transfers;Toileting    Examination-Participation Restrictions  Interpersonal Relationship;Community Activity;Shop;Driving;Meal Prep    Stability/Clinical Decision Making  Unstable/Unpredictable    Rehab Potential  Good    PT Frequency  1x / week    PT Duration  Other (comment)   10 weeks   PT Treatment/Interventions  ADLs/Self Care Home Management;Biofeedback;Electrical Stimulation;Moist Heat;Traction;Gait training;Functional mobility Network engineertraining;Stair training;Therapeutic activities;Therapeutic exercise;Balance training;Neuromuscular re-education;Patient/family education;Manual techniques;Scar mobilization;Passive range of motion;Taping;Dry needling;Visual/perceptual remediation/compensation;Spinal Manipulations;Joint Manipulations    PT Next Visit Plan  Further MFR around R hip/cupping? Review pelvic tilts with pillow, soda-can theory, prolapse with bearing down- symptom recreation?; reassess pelvic alignment; DN v TP release; teach self or partner STM techniques PRN; glute med stability;    PT Home Exercise Plan  R rib shift correction; diaphgramatic breathing; R QL stretch; log rolling and STS edu; side plank; tennis ball TP release; Prone hip-flexor stretch/hold-release; seated and hooklying chin tucks; bridges; given info on internal release and self infernal release materials, pre-squeeze and sneeze, chest stretch, scapular retraction/depression, seated and supine pelvic tilts with/without pillow    Consulted and Agree with Plan of Care  Patient       Patient will benefit from skilled therapeutic intervention in order to improve the following deficits and  impairments:  Decreased balance, Decreased endurance, Difficulty walking, Increased muscle  spasms, Improper body mechanics, Decreased activity tolerance, Decreased coordination, Decreased strength, Increased fascial restricitons, Postural dysfunction, Pain  Visit Diagnosis: Other muscle spasm  Abnormal posture  Other idiopathic scoliosis, thoracolumbar region     Problem List Patient Active Problem List   Diagnosis Date Noted  . Sty, external 05/14/2019  . Plantar fasciitis, bilateral 07/19/2017  . Sebaceous cyst of right axilla 07/19/2017  . Overweight (BMI 25.0-29.9) 04/28/2016  . Migraines 04/04/2016   Madison Randall DPT, ATC Madison Randall 08/07/2019, 12:08 PM  [facility] MAIN [facility] SERVICES [address] [place], [place], [address] Phone: [phone]   Fax:  [phone]  Name: Madison Randall MRN: [id number] Date of Birth: [birth year]

## 2019-08-06 NOTE — Patient Instructions (Signed)
Pelvic Tilt With Pelvic Floor (Hook-Lying)        Lie with hips and knees bent. Squeeze pelvic floor and flatten low back while breathing out so that pelvis tilts. Repeat _2x10__ times. Do _1-3__ times a day.  Put a pillow under your hips in this position when doing this exercise to help decrease pressure in the pelvis when it feels "heavy".

## 2019-08-13 ENCOUNTER — Ambulatory Visit: Payer: Medicaid Other

## 2019-08-13 ENCOUNTER — Other Ambulatory Visit: Payer: Self-pay

## 2019-08-13 DIAGNOSIS — M62838 Other muscle spasm: Secondary | ICD-10-CM

## 2019-08-13 DIAGNOSIS — R293 Abnormal posture: Secondary | ICD-10-CM

## 2019-08-13 DIAGNOSIS — M4125 Other idiopathic scoliosis, thoracolumbar region: Secondary | ICD-10-CM

## 2019-08-13 NOTE — Therapy (Signed)
[facility] [address] [place], [place], [id number] Phone: [phone]   Fax:  [phone]  Physical Therapy Treatment  The patient has been informed of current processes in place at Outpatient Rehab to protect patients from Covid-19 exposure including social distancing, schedule modifications, and new cleaning procedures. After discussing their particular risk with a therapist based on the patient's personal risk factors, the patient has decided to proceed with in-person therapy.   Patient Details  Name: Madison LevelKendra T Whitener MRN: [id number] Date of Birth: 12/16/1985 No data recorded  Encounter Date: 08/13/2019  PT End of Session - 08/13/19 1441    Visit Number  9    Number of Visits  11    Date for PT Re-Evaluation  08/19/19    Authorization Type  MCAID    Authorization Time Period  6/96/29521/07/2020 (cert) 84/13/244012/28/2020 (mcaid Berkley Harveyauth)    Authorization - Visit Number  9    Authorization - Number of Visits  11    PT Start Time  1335    PT Stop Time  1435    PT Time Calculation (min)  60 min    Activity Tolerance  Patient tolerated treatment well;No increased pain    Behavior During Therapy  WFL for tasks assessed/performed       Past Medical History:  Diagnosis Date  . Amenorrhea   . Migraine   . Migraines     Past Surgical History:  Procedure Laterality Date  . DILATION AND CURETTAGE OF UTERUS  09/2014 uterine infection form delivery 2015    There were no vitals filed for this visit.     Pelvic Floor Physical Therapy Treatment Note  SCREENING  Changes in medications, allergies, or medical history?: No    SUBJECTIVE  Patient reports: Had "shooting, electrifying pain" down the outside/back of the thigh to the knee which started that night and eased off by the next morning. Was intimate the next day and it still was really terrible even though it was only a couple minutes. Had a 6/10 "uncomfortableness" all week. Had  a sharp shooting vaginal pain last night while sitting and while in bed.   Precautions:  18 weeks postpartum   Pain update:  Location of pain: vaginal Current pain:  4/10  Max pain:  7/10  Least pain:  4/10  Nature of pain: burning sensation- lateral cutaneous femoral nerve   2/10 on NPS post session    Patient Goals: #1 goal- would to be to feel better in the pelvic area. Scared to be initiate with husband again. Still feels a bulge. Would like to be intimate 3x/week vs 1x/week. Feels like "a leaky faucet" afterword up through the next day.   OBJECTIVE  Changes in: Posture/Observations:  Anterior pelvic tilt  Range of Motion/Flexibilty:    Strength/MMT:  LE MMT:   Pelvic floor:  TTP to B posterior fourchette, all PFM on R, other internal on L not assessed today. Anterior vaginal wall visible at the Randall of the introitus with bearing down.  Abdominal:    Palpation: TTP to posterior R hip  Gait Analysis:   INTERVENTIONS THIS SESSION: Manual: Performed TP release to posterior fourchette B and R coccygeus, OI to decrease spasm and pain and allow for improved balance of musculature for improved function and decreased symptoms.  Self-care: Reviewed goals and discussed plan to focus more directly on internal PFM due to increased pain in vaginal area this week.   Total time: 60 minutes  PT Short Term Goals - 08/13/19 1443      PT SHORT TERM GOAL #1   Title  Patient will demonstrate improved pelvic alignment and balance of musculature surrounding the pelvis to facilitate decreased PFM spasms and decrease pelvic pain.    Baseline  posterior pelvic tilt, R rib shift, R scoliosis, spasms surrounding pelvis. 11-3 decreased R rib shift, decreased posterior pelvic tilt. 12/22: Pt. demonstrates ~80% improvement in pevic alignment and spasms but contiues to have some anterior pelvic tilt and mild R lumbar curve and spasms in  the posterior R hip.    Time  5    Period  Weeks    Status  On-going    Target Date  09/17/19      PT SHORT TERM GOAL #2   Title  Patient will demonstrate HEP x1 in the clinic to demonstrate understanding and proper form to allow for further improvement.    Baseline  Pt. lacks knowledge of therapeutic exercises that can help decrease her Sx. 11-3: pt compliance with HEP    Time  5    Period  Weeks    Status  Achieved    Target Date  07/30/19      PT SHORT TERM GOAL #3   Title  Patient will demonstrate improved sitting and standing posture to demonstrate learning and decrease stress on the pelvic floor with functional activity.    Baseline  posterior pelvic tilt, R rib-shift; 11-3 decreaesd posterior pelvic tilt, improved R rib shift and improved. 12/22: some remaining anterior pelvic tilt, ~ 80% improved    Time  5    Period  Weeks    Status  On-going    Target Date  09/17/19      PT SHORT TERM GOAL #4   Title  Patient will demonstrate a coordinated contraction, relaxation, and bulge of the pelvic floor muscles to demonstrate functional recruitment and motion and allow for further strengthening.    Baseline  Pt. demonstrates decreased recruitment and coordination of the PFM evidenced by her POP symptoms. 11-3 improved understanding of diphgragmatic breathing in relaxationship to PFM. 12/22: Pt. able to squeeze but continues to have trouble relaxing PFM and decreased strength with squeeze due to recurrent PFM spasms.    Time  5    Period  Weeks    Status  On-going    Target Date  09/17/19        PT Long Term Goals - 08/13/19 1506      PT LONG TERM GOAL #1   Title  Patient will describe feeling of pressure no more than 5% of the time over the course of the past week to demonstrate improved recruitment and strength of the pelvic floor.    Baseline  Pt. having increased POP sensation with walking for exercise, having a BM, sneezing, etc. As of 12/22: pt. reports 6/10  "uncomfortableness" in the pelvic area most of the time    Time  10    Period  Weeks    Status  On-going    Target Date  10/22/19      PT LONG TERM GOAL #2   Title  Patient will score at or below 32/300 on the PFDI, 25% on the Female NIH-CPSI and 10/35 on the NDI to demonstrate a clinically meaningful decrease in disability and distress due to pelvic floor dysfunction.    Baseline  PFDI: 77/300, NDI: 19/35, Female NIH-CPSI: 23/43 (53%) As of 08/13/19: PFDI:111/300, Female NIH: 27/43, did not reassess NDI  today.    Time  10    Period  Weeks    Status  On-going    Target Date  10/22/19      PT LONG TERM GOAL #3   Title  Patient will report no episodes of SUI over the course of the prior two weeks to demonstrate improved functional ability.    Baseline  Having SUI with cough, sneeze, etc. As of 12/22: is able to decrease SUI when she remembers to do posterior pelvic tilt and kegel before sneeze, less when she does still have leakage.    Time  10    Period  Weeks    Status  On-going    Target Date  10/22/19      PT LONG TERM GOAL #4   Title  Patient will describe pain no greater than 1/10 during laying down to demonstrate improved ability to sleep for overall health.    Baseline  Pain as high as 10/10, worst when laying down in the neck, shoulders, r hip, (and migraines) As of 12-22: Pt. describes much less R hip and neck pain as well as less frequent migraines, high of 7/10 vaginally this week but it was short-lived and resolved    Time  10    Period  Weeks    Status  On-going    Target Date  10/22/19            Plan - 08/13/19 1528    Clinical Impression Statement  Pt. has demonstrates significant improvement in her R hip pain and stability as well as decreased frequency of head/neck pain and was able to have ~ 60% decreased pain with intercourse following internal TP release in November but has not been able to afford self internal release tool so spasms have returned and her  recent attempts at intercourse have been worse again which has frustrated the Pt. and accounts for the fact that her outcome measures seem to indicate a worse picture than at initial assessment. We started to do more internal TP release today due to increased pain and found that there are still a lot of spasms that need to be addressed while we countinue to help stabilize the low back and pelvis to prevent return of spasms but this will be aided by the Pt's plan to purchase internal release tool this week. SHe is expected to continue to make progress as we are able to attack her pain from both sides and increase stability. We will continue for 10 more weeks at 1 time per week to continue working toward our initial goals.    Personal Factors and Comorbidities  Comorbidity 3+    Comorbidities  Migraines, Plantar Fasciitis, Scoliosis    Examination-Activity Limitations  Sit;Sleep;Lift;Squat;Bend;Caring for Others;Locomotion Randall;Stand;Transfers;Toileting    Examination-Participation Restrictions  Interpersonal Relationship;Community Activity;Shop;Driving;Meal Prep    Stability/Clinical Decision Making  Unstable/Unpredictable    Rehab Potential  Good    PT Frequency  1x / week    PT Duration  Other (comment)   10 weeks   PT Treatment/Interventions  ADLs/Self Care Home Management;Biofeedback;Electrical Stimulation;Moist Heat;Traction;Gait training;Functional mobility Network engineer;Therapeutic activities;Therapeutic exercise;Balance training;Neuromuscular re-education;Patient/family education;Manual techniques;Scar mobilization;Passive range of motion;Taping;Dry needling;Visual/perceptual remediation/compensation;Spinal Manipulations;Joint Manipulations    PT Next Visit Plan  Further MFR around R hip/cupping? Review pelvic tilts with pillow, soda-can theory, prolapse with bearing down- symptom recreation?; reassess pelvic alignment; DN v TP release; teach self or partner STM techniques PRN; glute med  stability;    PT Home Exercise Plan  R rib shift correction; diaphgramatic breathing; R QL stretch; log rolling and STS edu; side plank; tennis ball TP release; Prone hip-flexor stretch/hold-release; seated and hooklying chin tucks; bridges; given info on internal release and self infernal release materials, pre-squeeze and sneeze, chest stretch, scapular retraction/depression, seated and supine pelvic tilts with/without pillow    Consulted and Agree with Plan of Care  Patient       Patient will benefit from skilled therapeutic intervention in order to improve the following deficits and impairments:  Decreased balance, Decreased endurance, Difficulty walking, Increased muscle spasms, Improper body mechanics, Decreased activity tolerance, Decreased coordination, Decreased strength, Increased fascial restricitons, Postural dysfunction, Pain  Visit Diagnosis: Other muscle spasm  Abnormal posture  Other idiopathic scoliosis, thoracolumbar region     Problem List Patient Active Problem List   Diagnosis Date Noted  . Sty, external 05/14/2019  . Plantar fasciitis, bilateral 07/19/2017  . Sebaceous cyst of right axilla 07/19/2017  . Overweight (BMI 25.0-29.9) 04/28/2016  . Migraines 04/04/2016   Willa Rough DPT, ATC Willa Rough 08/14/2019, 3:44 PM  [place] MAIN [facility] SERVICES [address] [place], [place], [address] Phone: [phone]   Fax:  [phone]  Name: ZURII HEWES MRN: [id number] Date of Birth: November 10, 1985

## 2019-08-28 ENCOUNTER — Ambulatory Visit: Payer: Medicaid Other

## 2019-09-03 ENCOUNTER — Other Ambulatory Visit: Payer: Self-pay

## 2019-09-03 ENCOUNTER — Ambulatory Visit: Payer: Medicaid Other | Attending: Obstetrics and Gynecology

## 2019-09-03 DIAGNOSIS — R293 Abnormal posture: Secondary | ICD-10-CM | POA: Insufficient documentation

## 2019-09-03 DIAGNOSIS — M62838 Other muscle spasm: Secondary | ICD-10-CM | POA: Insufficient documentation

## 2019-09-03 DIAGNOSIS — M4125 Other idiopathic scoliosis, thoracolumbar region: Secondary | ICD-10-CM | POA: Diagnosis present

## 2019-09-03 NOTE — Therapy (Signed)
[facility] MAIN [facility] SERVICES [address] [place], [place], [address] Phone: [phone]   Fax:  [phone]  Physical Therapy Treatment  The patient has been informed of current processes in place at Outpatient Rehab to protect patients from Covid-19 exposure including social distancing, schedule modifications, and new cleaning procedures. After discussing their particular risk with a therapist based on the patient's personal risk factors, the patient has decided to proceed with in-person therapy.   Patient Details  Name: Madison Randall MRN: [id number] Date of Birth: 1986/08/17 No data recorded  Encounter Date: 09/03/2019  PT End of Session - 09/03/19 1535    Visit Number  10    Number of Visits  21    Date for PT Re-Evaluation  08/19/19    Authorization Type  MCA[employer]    Authorization Time Period  08/13/2019 through 7/0/01  (re-cert)    [id number] through 09/17/19 [name])    Authorization - Visit Number  1    Authorization - Number of Visits  3    PT Start Time  1430    PT Stop Time  1530    PT Time Calculation (min)  60 min    Activity Tolerance  Patient tolerated treatment well;No increased pain    Behavior During Therapy  WFL for tasks assessed/performed       Past Medical History:  Diagnosis Date  . Amenorrhea   . Migraine   . Migraines     Past Surgical History:  Procedure Laterality Date  . DILATION AND CURETTAGE OF UTERUS  09/2014 uterine infection form delivery 2015    There were no vitals filed for this visit.   Pelvic Floor Physical Therapy Treatment Note  SCREENING  Changes in medications, allergies, or medical history?: No    SUBJECTIVE  Patient reports: Managed to avoid getting Covid-19 but has been moving while she was quarantining, had to move furniture. Her hip has hurt a lot. Over the last week she was not feeling the bulge as much. ~ 1 week ago Intercourse was not horrible but was not pleasurable.  3-4 nights ago it was better, semi-pleasurable. Afterward she still had some feeling like she was "swollen on the inside" afterward but was not as leaky or uncomfortable the next day. Slightly less scary when she is trying to shave her legs. Had a lot of neck/shoulders pain with the stress of the whole situation. Her stepfather was in the hospital twice but is back at home now. Neck/shoulders are at 5./10 right now, were up to "12/10"    Precautions:  22 weeks postpartum   Pain update:  Location of pain: hip (vaginal pressure/discomfort) Current pain:  5/10 (3) Max pain:  10/10 (5) Least pain:  3/10 (3) Nature of pain: burning sensation- lateral cutaneous femoral nerve   **0/10 pain in R hip following treatment   Patient Goals: #1 goal- would to be to feel better in the pelvic area. Scared to be initiate with husband again. Still feels a bulge. Would like to be intimate 3x/week vs 1x/week. Feels like "a leaky faucet" afterword up through the next day.   OBJECTIVE  Changes in: Posture/Observations:   Range of Motion/Flexibilty:  Decreased hip EXT ROM on R>L  Strength/MMT:  LE MMT:   Pelvic floor:  TTP to B posterior fourchette, all PFM on R, other internal on L not assessed today. Anterior vaginal wall visible at the level of the introitus with bearing down. (from 12-22)  Abdominal:  Palpation: TTP to R Psoas, Iliacus, and QL  Gait Analysis:   INTERVENTIONS THIS SESSION: Manual: Performed TP release to R Psoas and reviewed how to perform self TP release to QL and posterior hip to decrease spasm and pain and allow for improved balance of musculature for improved function and decreased symptoms.  Therex: Reminded Pt. Of importance of returning to her HEP to allow for decrease pain and to continue to improve balance of musculature surrounding the pelvis.   Total time: 60 minutes                             PT Short Term Goals - 08/13/19 1443       PT SHORT TERM GOAL #1   Title  Patient will demonstrate improved pelvic alignment and balance of musculature surrounding the pelvis to facilitate decreased PFM spasms and decrease pelvic pain.    Baseline  posterior pelvic tilt, R rib shift, R scoliosis, spasms surrounding pelvis. 11-3 decreased R rib shift, decreased posterior pelvic tilt. 12/22: Pt. demonstrates ~80% improvement in pevic alignment and spasms but contiues to have some anterior pelvic tilt and mild R lumbar curve and spasms in the posterior R hip.    Time  5    Period  Weeks    Status  On-going    Target Date  09/17/19      PT SHORT TERM GOAL #2   Title  Patient will demonstrate HEP x1 in the clinic to demonstrate understanding and proper form to allow for further improvement.    Baseline  Pt. lacks knowledge of therapeutic exercises that can help decrease her Sx. 11-3: pt compliance with HEP    Time  5    Period  Weeks    Status  Achieved    Target Date  07/30/19      PT SHORT TERM GOAL #3   Title  Patient will demonstrate improved sitting and standing posture to demonstrate learning and decrease stress on the pelvic floor with functional activity.    Baseline  posterior pelvic tilt, R rib-shift; 11-3 decreaesd posterior pelvic tilt, improved R rib shift and improved. 12/22: some remaining anterior pelvic tilt, ~ 80% improved    Time  5    Period  Weeks    Status  On-going    Target Date  09/17/19      PT SHORT TERM GOAL #4   Title  Patient will demonstrate a coordinated contraction, relaxation, and bulge of the pelvic floor muscles to demonstrate functional recruitment and motion and allow for further strengthening.    Baseline  Pt. demonstrates decreased recruitment and coordination of the PFM evidenced by her POP symptoms. 11-3 improved understanding of diphgragmatic breathing in relaxationship to PFM. 12/22: Pt. able to squeeze but continues to have trouble relaxing PFM and decreased strength with squeeze due to  recurrent PFM spasms.    Time  5    Period  Weeks    Status  On-going    Target Date  09/17/19        PT Long Term Goals - 08/13/19 1506      PT LONG TERM GOAL #1   Title  Patient will describe feeling of pressure no more than 5% of the time over the course of the past week to demonstrate improved recruitment and strength of the pelvic floor.    Baseline  Pt. having increased POP sensation with walking for exercise, having a BM,  sneezing, etc. As of 12/22: pt. reports 6/10 "uncomfortableness" in the pelvic area most of the time    Time  10    Period  Weeks    Status  On-going    Target Date  10/22/19      PT LONG TERM GOAL #2   Title  Patient will score at or below 32/300 on the PFDI, 25% on the Female NIH-CPSI and 10/35 on the NDI to demonstrate a clinically meaningful decrease in disability and distress due to pelvic floor dysfunction.    Baseline  PFDI: 77/300, NDI: 19/35, Female NIH-CPSI: 23/43 (53%) As of 08/13/19: PFDI:111/300, Female NIH: 27/43, did not reassess NDI today.    Time  10    Period  Weeks    Status  On-going    Target Date  10/22/19      PT LONG TERM GOAL #3   Title  Patient will report no episodes of SUI over the course of the prior two weeks to demonstrate improved functional ability.    Baseline  Having SUI with cough, sneeze, etc. As of 12/22: is able to decrease SUI when she remembers to do posterior pelvic tilt and kegel before sneeze, less when she does still have leakage.    Time  10    Period  Weeks    Status  On-going    Target Date  10/22/19      PT LONG TERM GOAL #4   Title  Patient will describe pain no greater than 1/10 during laying down to demonstrate improved ability to sleep for overall health.    Baseline  Pain as high as 10/10, worst when laying down in the neck, shoulders, r hip, (and migraines) As of 12-22: Pt. describes much less R hip and neck pain as well as less frequent migraines, high of 7/10 vaginally this week but it was  short-lived and resolved    Time  10    Period  Weeks    Status  On-going    Target Date  10/22/19            Plan - 09/03/19 1537    Clinical Impression Statement  Pt. Responded well to all interventions today, demonstrating decreased spasm and pain and improved pain with intercourse between-sessions as well as understanding and correct performance of all education and exercises provided today. They will continue to benefit from skilled physical therapy to work toward remaining goals and maximize function as well as decrease likelihood of symptom increase or recurrence.     PT Next Visit Plan  further internal release PRN. Further MFR around R hip/cupping? Review pelvic tilts with pillow, soda-can theory, prolapse with bearing down- symptom recreation?; reassess pelvic alignment; DN v TP release; teach self or partner STM techniques PRN; glute med stability;    PT Home Exercise Plan  R rib shift correction; diaphgramatic breathing; R QL stretch; log rolling and STS edu; side plank; tennis ball TP release; Prone hip-flexor stretch/hold-release; seated and hooklying chin tucks; bridges; given info on internal release and self infernal release materials, pre-squeeze and sneeze, chest stretch, scapular retraction/depression, seated and supine pelvic tilts with/without pillow    Consulted and Agree with Plan of Care  Patient       Patient will benefit from skilled therapeutic intervention in order to improve the following deficits and impairments:     Visit Diagnosis: Other muscle spasm  Abnormal posture  Other idiopathic scoliosis, thoracolumbar region     Problem List Patient Active  Problem List   Diagnosis Date Noted  . Sty, external 05/14/2019  . Plantar fasciitis, bilateral 07/19/2017  . Sebaceous cyst of right axilla 07/19/2017  . Overweight (BMI 25.0-29.9) 04/28/2016  . Migraines 04/04/2016   Cleophus Molt DPT, ATC Cleophus Molt 09/03/2019, 3:46 PM  [facility] MAIN [facility] SERVICES [address] [place], [place], [address] Phone: [phone]   Fax:  [phone]  Name: Madison Randall MRN: [id number] Date of Birth: October 29, 1985

## 2019-09-17 ENCOUNTER — Other Ambulatory Visit: Payer: Self-pay

## 2019-09-17 ENCOUNTER — Ambulatory Visit: Payer: Medicaid Other

## 2019-09-17 DIAGNOSIS — M4125 Other idiopathic scoliosis, thoracolumbar region: Secondary | ICD-10-CM

## 2019-09-17 DIAGNOSIS — M62838 Other muscle spasm: Secondary | ICD-10-CM

## 2019-09-17 DIAGNOSIS — R293 Abnormal posture: Secondary | ICD-10-CM

## 2019-09-17 NOTE — Patient Instructions (Signed)
   Let the top arm rest on your side, and slide along the torso as you rotate. Breathe in as you come forward, out as you open up. Do 2x15 on each side. (hold in stretch for 10 deep breaths on each side.)     EXhale on EXertion!!!!! (pushing, pulling, lifting, standing, etc.)  Whenever you exert force you need to exhale to allow the pressure to escape out of your vocal cords rather than be pressed down through your pelvic floor. Exhaling also helps engage your deep-core muscles to protect your back and pelvic floor.  Pelvic Tilt With Pelvic Floor (Hook-Lying)        Lie with hips and knees bent. Squeeze pelvic floor and flatten low back while breathing out so that pelvis tilts. Repeat _10x3__ times. Do _1-2__ times a day.  Put a pillow under your hips in this position when doing this exercise to help decrease pressure in the pelvis when it feels "heavy".

## 2019-09-17 NOTE — Therapy (Signed)
[place] MAIN [facility] SERVICES [address] [place], [place], [address] Phone: [phone]   Fax:  [phone]  Physical Therapy Treatment  The patient has been informed of current processes in place at Outpatient Rehab to protect patients from Covid-19 exposure including social distancing, schedule modifications, and new cleaning procedures. After discussing their particular risk with a therapist based on the patient's personal risk factors, the patient has decided to proceed with in-person therapy.   Patient Details  Name: Madison Randall MRN: [id number] Date of Birth: 10/11/1985 No data recorded  Encounter Date: 09/17/2019  PT End of Session - 09/17/19 1320    Visit Number  11    Number of Visits  21    Date for PT Re-Evaluation  08/19/19    Authorization Type  MCAID    Authorization Time Period  08/13/2019 through 10/21/97  (re-cert)    09-25-2681 through 09/17/19 Iver Nestle Josem Kaufmann)    Authorization - Visit Number  2    Authorization - Number of Visits  3    PT Start Time  1320    PT Stop Time  1400    PT Time Calculation (min)  40 min    Activity Tolerance  Patient tolerated treatment well;No increased pain    Behavior During Therapy  WFL for tasks assessed/performed       Past Medical History:  Diagnosis Date  . Amenorrhea   . Migraine   . Migraines     Past Surgical History:  Procedure Laterality Date  . DILATION AND CURETTAGE OF UTERUS  09/2014 uterine infection form delivery 2015    There were no vitals filed for this visit.   Pelvic Floor Physical Therapy Treatment Note  SCREENING  Changes in medications, allergies, or medical history?: No    SUBJECTIVE  Patient reports: "lives at a 3/10, it goes up to a 5/10 (pressure/discomfort in vagina). Hip pain lives at ~5/10. Burning sensation gets worse with sitting on the ground.  Has a sensation of a corset style tightness around upper thigh (~ L2 distribution)  Precautions:   24 weeks postpartum   Pain update:  Location of pain: hip (vaginal pressure/discomfort) Current pain:  5/10 (3) Max pain:  7/10 (5) Least pain:  3/10 (3) Nature of pain: burning sensation- lateral cutaneous femoral nerve      Patient Goals: #1 goal- would to be to feel better in the pelvic area. Scared to be initiate with husband again. Still feels a bulge. Would like to be intimate 3x/week vs 1x/week. Feels like "a leaky faucet" afterword up through the next day.   OBJECTIVE  Changes in: Posture/Observations:  R ASIS and PSIS very slightly high in standing.   Range of Motion/Flexibilty:  Decreased hip EXT ROM on R>Ldecreased thoracolumbar rotation B to ~ 30% normal range in standing.  Strength/MMT:  LE MMT:   Pelvic floor:  TTP to B posterior fourchette, all PFM on R, other internal on L not assessed today. Anterior vaginal wall visible at the level of the introitus with bearing down. (from 12-22)  Abdominal:    Palpation: TTP to L erector spinae near T11-L2 Decreased fascial mobility with burning elicited by cupping at B lateral waist.  Gait Analysis:   INTERVENTIONS THIS SESSION: Manual: Performed TP release to L erector spinae near T11-L2 followed by MFR with use of medium cupping cups B along ~ L2 distribution into the waist to improve fascial mobility, decrease spasm and pain, and allow for improved balance  of musculature for improved function and decreased symptoms.   Therex: Educated on and practiced bow-and-arrow B to improve mobility of joint and surrounding connective tissue and decrease pressure on nerve roots for improved conductivity and function of down-stream tissues.    Self-care: reviewed sit-to-stand, Soda can theory, and educated on using posterior pelvic tilt with pillow as back-pocket POP fix to further decrease "uncomfortableness" in the pelvic area.  Total time: 60 minutes                               PT Short  Term Goals - 09/17/19 1327      PT SHORT TERM GOAL #1   Title  Patient will demonstrate improved pelvic alignment and balance of musculature surrounding the pelvis to facilitate decreased PFM spasms and decrease pelvic pain.    Baseline  posterior pelvic tilt, R rib shift, R scoliosis, spasms surrounding pelvis. 11-3 decreased R rib shift, decreased posterior pelvic tilt. 12/22: Pt. demonstrates ~80% improvement in pevic alignment and spasms but contiues to have some anterior pelvic tilt and mild R lumbar curve and spasms in the posterior R hip.    Time  5    Period  Weeks    Status  Achieved    Target Date  09/17/19      PT SHORT TERM GOAL #2   Title  Patient will demonstrate HEP x1 in the clinic to demonstrate understanding and proper form to allow for further improvement.    Baseline  Pt. lacks knowledge of therapeutic exercises that can help decrease her Sx. 11-3: pt compliance with HEP    Time  5    Period  Weeks    Status  Achieved    Target Date  07/30/19      PT SHORT TERM GOAL #3   Title  Patient will demonstrate improved sitting and standing posture to demonstrate learning and decrease stress on the pelvic floor with functional activity.    Baseline  posterior pelvic tilt, R rib-shift; 11-3 decreaesd posterior pelvic tilt, improved R rib shift and improved. 12/22: some remaining anterior pelvic tilt, ~ 80% improved    Time  5    Period  Weeks    Status  Achieved    Target Date  09/17/19      PT SHORT TERM GOAL #4   Title  Patient will demonstrate a coordinated contraction, relaxation, and bulge of the pelvic floor muscles to demonstrate functional recruitment and motion and allow for further strengthening.    Baseline  Pt. demonstrates decreased recruitment and coordination of the PFM evidenced by her POP symptoms. 11-3 improved understanding of diphgragmatic breathing in relaxationship to PFM. 12/22: Pt. able to squeeze but continues to have trouble relaxing PFM and decreased  strength with squeeze due to recurrent PFM spasms.    Time  5    Period  Weeks    Status  Achieved    Target Date  09/17/19        PT Long Term Goals - 09/17/19 1329      PT LONG TERM GOAL #1   Title  Patient will describe feeling of pressure no more than 5% of the time over the course of the past week to demonstrate improved recruitment and strength of the pelvic floor.    Baseline  Pt. having increased POP sensation with walking for exercise, having a BM, sneezing, etc. As of 12/22: pt. reports 6/10 "uncomfortableness" in  the pelvic area most of the time As of 1/26: reports average of  3/10 "uncomfortableness" in the pelvic area most of the time.    Time  10    Period  Weeks    Status  On-going    Target Date  10/22/19      PT LONG TERM GOAL #2   Title  Patient will score at or below 32/300 on the PFDI, 25% on the Female NIH-CPSI and 10/35 on the NDI to demonstrate a clinically meaningful decrease in disability and distress due to pelvic floor dysfunction.    Baseline  PFDI: 77/300, NDI: 19/35, Female NIH-CPSI: 23/43 (53%) As of 08/13/19: PFDI:111/300, Female NIH: 27/43, did not reassess NDI today.    Time  10    Period  Weeks    Status  On-going    Target Date  10/22/19      PT LONG TERM GOAL #3   Title  Patient will report no episodes of SUI over the course of the prior two weeks to demonstrate improved functional ability.    Baseline  Having SUI with cough, sneeze, etc. As of 12/22: is able to decrease SUI when she remembers to do posterior pelvic tilt and kegel before sneeze, less when she does still have leakage. As of 1/26: leakage is mostly after having intercourse, less often with sneeze etc.    Time  10    Period  Weeks    Status  On-going    Target Date  10/22/19      PT LONG TERM GOAL #4   Title  Patient will describe pain no greater than 1/10 during laying down to demonstrate improved ability to sleep for overall health.    Baseline  Pain as high as 10/10, worst  when laying down in the neck, shoulders, r hip, (and migraines) As of 12-22: Pt. describes much less R hip and neck pain as well as less frequent migraines, high of 7/10 vaginally this week but it was short-lived and resolved As of 1/26:    Time  10    Period  Weeks    Status  On-going            Plan - 09/17/19 1321    Clinical Impression Statement  Pt. Responded well to all interventions today, demonstrating improved fascial mobility, decreased spasm and TTP through the L2 nerve distribution, as well as understanding and correct performance of all education and exercises provided today. She is making slow but steady progress toward all remaining goals, which was interrupted by having to quarantine for Covid-19 exposure and scheduling issues on the clinic side, and She will continue to benefit from skilled physical therapy to work toward remaining goals and maximize function as well as decrease likelihood of symptom increase or recurrence.      PT Next Visit Plan  further internal release PRN. Further MFR around R hip/cupping? Review pelvic tilts with pillow, soda-can theory, prolapse with bearing down- symptom recreation?; reassess pelvic alignment; DN v TP release; teach self or partner STM techniques PRN; glute med stability;    PT Home Exercise Plan  R rib shift correction; diaphgramatic breathing; R QL stretch; log rolling and STS edu; side plank; tennis ball TP release; Prone hip-flexor stretch/hold-release; seated and hooklying chin tucks; bridges; given info on internal release and self infernal release materials, pre-squeeze and sneeze, chest stretch, scapular retraction/depression, seated and supine pelvic tilts with/without pillow    Consulted and Agree with Plan of Care  Patient       Patient will benefit from skilled therapeutic intervention in order to improve the following deficits and impairments:     Visit Diagnosis: Other muscle spasm  Abnormal posture  Other  idiopathic scoliosis, thoracolumbar region     Problem List Patient Active Problem List   Diagnosis Date Noted  . Sty, external 05/14/2019  . Plantar fasciitis, bilateral 07/19/2017  . Sebaceous cyst of right axilla 07/19/2017  . Overweight (BMI 25.0-29.9) 04/28/2016  . Migraines 04/04/2016   Cleophus Molt DPT, ATC Cleophus Molt 09/17/2019, 5:04 PM  [facility] MAIN [facility] SERVICES [address] [place], [place], [address] Phone: [phone]   Fax:  [phone]  Name: Madison Randall MRN: [id number] Date of Birth: 06-03-1986

## 2019-09-26 ENCOUNTER — Other Ambulatory Visit: Payer: Self-pay

## 2019-09-26 ENCOUNTER — Ambulatory Visit: Payer: Medicaid Other | Attending: Obstetrics and Gynecology

## 2019-09-26 DIAGNOSIS — M4125 Other idiopathic scoliosis, thoracolumbar region: Secondary | ICD-10-CM | POA: Diagnosis present

## 2019-09-26 DIAGNOSIS — R293 Abnormal posture: Secondary | ICD-10-CM | POA: Diagnosis present

## 2019-09-26 DIAGNOSIS — M62838 Other muscle spasm: Secondary | ICD-10-CM

## 2019-09-26 NOTE — Therapy (Addendum)
[facility] MAIN [facility] SERVICES [address] [place], [place], [address] Phone: [phone]   Fax:  [phone]  Physical Therapy Treatment  The patient has been informed of current processes in place at Outpatient Rehab to protect patients from Covid-19 exposure including social distancing, schedule modifications, and new cleaning procedures. After discussing their particular risk with a therapist based on the patient's personal risk factors, the patient has decided to proceed with in-person therapy.   Patient Details  Name: Madison Randall MRN: [id number] Date of Birth: 07/11/1986 No data recorded  Encounter Date: 09/26/2019  PT End of Session - 09/26/19 1441    Visit Number  12    Number of Visits  21    Date for PT Re-Evaluation  08/19/19    Authorization Type  MCAID    Authorization Time Period  08/13/2019 through 2/0/35  (re-cert)    [id number] through 09/17/19 [name])    Authorization - Visit Number  3    Authorization - Number of Visits  3    PT Start Time  1439    PT Stop Time  1530    PT Time Calculation (min)  51 min    Activity Tolerance  Patient tolerated treatment well;No increased pain    Behavior During Therapy  WFL for tasks assessed/performed       Past Medical History:  Diagnosis Date  . Amenorrhea   . Migraine   . Migraines     Past Surgical History:  Procedure Laterality Date  . DILATION AND CURETTAGE OF UTERUS  09/2014 uterine infection form delivery 2015    There were no vitals filed for this visit.   Pelvic Floor Physical Therapy Treatment Note  SCREENING  Changes in medications, allergies, or medical history?: No    SUBJECTIVE  Patient reports: Intercourse is not horrible but is not pleasurable. She felt a strong pulsating sensation for ~ 1 hr. Following intercourse "like a heart-beat" but it didn't hurt. Still leaks a little right after but not through the night or next day anymore.Her head and  neck are hurting really bad.   Gets a  "pop" in the neck at least once a day that hurts. Has pain when she reaches to her L shoulder with her R hand. She has problems with her L shoulder base-line. Has historically had more pain in her L shoulder with previous pregnancy when she lays down rather than when she is up and moving. Uses rice sock (heat pack) and deep blue (analgesic)daily to help decreased pain. Shoulder massage helps pain. Just started having decreased ROM in R shoulder over the last month.  *Stress levels have been "through the roof" the last 2 months.    Precautions:  25 weeks postpartum   Pain update:  Location of pain: hip (vaginal pressure/discomfort) Current pain:  5/10 (3) Max pain:  7/10 (5) Least pain:  3/10 (3) Nature of pain: burning sensation- lateral cutaneous femoral nerve    Location of pain: neck Current pain:  5/10 (3) Max pain:  7/10 (5) Least pain:  3/10 (3) Nature of pain:   Patient Goals: #1 goal- would to be to feel better in the pelvic area. Scared to be initiate with husband again. Still feels a bulge. Would like to be intimate 3x/week vs 1x/week. Feels like "a leaky faucet" afterword up through the next day.   OBJECTIVE  Changes in: Posture/Observations:  R ASIS and PSIS very slightly high in standing.   Range  of Motion/Flexibilty:  Decreased hip EXT ROM on R>Ldecreased thoracolumbar rotation B to ~ 30% normal range in standing.  Strength/MMT:  LE MMT:   Pelvic floor:  TTP to B posterior fourchette, all PFM on R, other internal on L not assessed today. Anterior vaginal wall visible at the level of the introitus with bearing down. (from 12-22)  Abdominal:    Palpation: TTP to L erector spinae near T11-L2 Decreased fascial mobility with burning elicited by cupping at B lateral waist.  Gait Analysis:   INTERVENTIONS THIS SESSION: Manual: Performed TP release to L>R sub-occipitals, B upper traps, and R middle scalenes to decrease  spasm and pain and allow for improved balance of musculature and spinal alignement for improved function and decreased symptoms.   Therex: Reviewed chin-tucks in supine and sitting to improve stability in correct cervical alignment. Educated on rotator cuff stretches and strengthening program to decrease tension/spasms in the R neck/shoulder encouraging poor cervical alignment and to down-regulate nervous system and improve inter-regional dependence, given handout.  Total time: 60 minutes                             PT Short Term Goals - 09/17/19 1327      PT SHORT TERM GOAL #1   Title  Patient will demonstrate improved pelvic alignment and balance of musculature surrounding the pelvis to facilitate decreased PFM spasms and decrease pelvic pain.    Baseline  posterior pelvic tilt, R rib shift, R scoliosis, spasms surrounding pelvis. 11-3 decreased R rib shift, decreased posterior pelvic tilt. 12/22: Pt. demonstrates ~80% improvement in pevic alignment and spasms but contiues to have some anterior pelvic tilt and mild R lumbar curve and spasms in the posterior R hip.    Time  5    Period  Weeks    Status  Achieved    Target Date  09/17/19      PT SHORT TERM GOAL #2   Title  Patient will demonstrate HEP x1 in the clinic to demonstrate understanding and proper form to allow for further improvement.    Baseline  Pt. lacks knowledge of therapeutic exercises that can help decrease her Sx. 11-3: pt compliance with HEP    Time  5    Period  Weeks    Status  Achieved    Target Date  07/30/19      PT SHORT TERM GOAL #3   Title  Patient will demonstrate improved sitting and standing posture to demonstrate learning and decrease stress on the pelvic floor with functional activity.    Baseline  posterior pelvic tilt, R rib-shift; 11-3 decreaesd posterior pelvic tilt, improved R rib shift and improved. 12/22: some remaining anterior pelvic tilt, ~ 80% improved    Time  5     Period  Weeks    Status  Achieved    Target Date  09/17/19      PT SHORT TERM GOAL #4   Title  Patient will demonstrate a coordinated contraction, relaxation, and bulge of the pelvic floor muscles to demonstrate functional recruitment and motion and allow for further strengthening.    Baseline  Pt. demonstrates decreased recruitment and coordination of the PFM evidenced by her POP symptoms. 11-3 improved understanding of diphgragmatic breathing in relaxationship to PFM. 12/22: Pt. able to squeeze but continues to have trouble relaxing PFM and decreased strength with squeeze due to recurrent PFM spasms.    Time  5  Period  Weeks    Status  Achieved    Target Date  09/17/19        PT Long Term Goals - 09/17/19 1329      PT LONG TERM GOAL #1   Title  Patient will describe feeling of pressure no more than 5% of the time over the course of the past week to demonstrate improved recruitment and strength of the pelvic floor.    Baseline  Pt. having increased POP sensation with walking for exercise, having a BM, sneezing, etc. As of 12/22: pt. reports 6/10 "uncomfortableness" in the pelvic area most of the time As of 1/26: reports average of  3/10 "uncomfortableness" in the pelvic area most of the time.    Time  10    Period  Weeks    Status  On-going    Target Date  10/22/19      PT LONG TERM GOAL #2   Title  Patient will score at or below 32/300 on the PFDI, 25% on the Female NIH-CPSI and 10/35 on the NDI to demonstrate a clinically meaningful decrease in disability and distress due to pelvic floor dysfunction.    Baseline  PFDI: 77/300, NDI: 19/35, Female NIH-CPSI: 23/43 (53%) As of 08/13/19: PFDI:111/300, Female NIH: 27/43, did not reassess NDI today.    Time  10    Period  Weeks    Status  On-going    Target Date  10/22/19      PT LONG TERM GOAL #3   Title  Patient will report no episodes of SUI over the course of the prior two weeks to demonstrate improved functional ability.     Baseline  Having SUI with cough, sneeze, etc. As of 12/22: is able to decrease SUI when she remembers to do posterior pelvic tilt and kegel before sneeze, less when she does still have leakage. As of 1/26: leakage is mostly after having intercourse, less often with sneeze etc.    Time  10    Period  Weeks    Status  On-going    Target Date  10/22/19      PT LONG TERM GOAL #4   Title  Patient will describe pain no greater than 1/10 during laying down to demonstrate improved ability to sleep for overall health.    Baseline  Pain as high as 10/10, worst when laying down in the neck, shoulders, r hip, (and migraines) As of 12-22: Pt. describes much less R hip and neck pain as well as less frequent migraines, high of 7/10 vaginally this week but it was short-lived and resolved As of 1/26:    Time  10    Period  Weeks    Status  On-going            Plan - 09/26/19 1442    Clinical Impression Statement  Pt. Responded well to all interventions today, demonstrating improved cervical mobility, decreased pain and spasm, as well as understanding and correct performance of all education and exercises provided today. They will continue to benefit from skilled physical therapy to work toward remaining goals and maximize function as well as decrease likelihood of symptom increase or recurrence.     PT Next Visit Plan  further internal release PRN. Further MFR around R hip/cupping? Review pelvic tilts with pillow, soda-can theory, prolapse with bearing down- symptom recreation?; reassess pelvic alignment; DN v TP release; teach self or partner STM techniques PRN; glute med stability;    PT Home Exercise  Plan  R rib shift correction; diaphgramatic breathing; R QL stretch; log rolling and STS edu; side plank; tennis ball TP release; Prone hip-flexor stretch/hold-release; seated and hooklying chin tucks; bridges; given info on internal release and self infernal release materials, pre-squeeze and sneeze, chest  stretch, scapular retraction/depression, seated and supine pelvic tilts with/without pillow    Consulted and Agree with Plan of Care  Patient       Patient will benefit from skilled therapeutic intervention in order to improve the following deficits and impairments:     Visit Diagnosis: Other muscle spasm  Abnormal posture  Other idiopathic scoliosis, thoracolumbar region     Problem List Patient Active Problem List   Diagnosis Date Noted  . Sty, external 05/14/2019  . Plantar fasciitis, bilateral 07/19/2017  . Sebaceous cyst of right axilla 07/19/2017  . Overweight (BMI 25.0-29.9) 04/28/2016  . Migraines 04/04/2016   Willa Rough DPT, ATC Willa Rough 09/27/2019, 12:06 PM  [place] MAIN [facility] SERVICES [address] [place], [place], [address] Phone: [phone]   Fax:  [phone]  Name: Madison Randall MRN: [id number] Date of Birth: [birth year]

## 2019-10-03 ENCOUNTER — Other Ambulatory Visit: Payer: Self-pay

## 2019-10-03 ENCOUNTER — Ambulatory Visit: Payer: Medicaid Other

## 2019-10-03 DIAGNOSIS — M62838 Other muscle spasm: Secondary | ICD-10-CM

## 2019-10-03 DIAGNOSIS — M4125 Other idiopathic scoliosis, thoracolumbar region: Secondary | ICD-10-CM

## 2019-10-03 DIAGNOSIS — R293 Abnormal posture: Secondary | ICD-10-CM

## 2019-10-03 NOTE — Therapy (Addendum)
[facility] MAIN [facility] SERVICES [address] [place], [place], [address] Phone: [phone]   Fax:  [phone]  Physical Therapy Treatment  The patient has been informed of current processes in place at Outpatient Rehab to protect patients from Covid-19 exposure including social distancing, schedule modifications, and new cleaning procedures. After discussing their particular risk with a therapist based on the patient's personal risk factors, the patient has decided to proceed with in-person therapy.   Patient Details  Name: Madison Randall MRN: [id number] Date of Birth: 1985-10-03 No data recorded  Encounter Date: 10/03/2019  PT End of Session - 10/08/19 1307    Visit Number  13    Number of Visits  21    Date for PT Re-Evaluation  10/22/19    Authorization Type  MCAID    Authorization Time Period  08/13/2019 through 5/0/27  (re-cert)    7-4/1-28  [name])    Authorization - Visit Number  2    Authorization - Number of Visits  10    PT Start Time  1440    PT Stop Time  1540    PT Time Calculation (min)  60 min    Activity Tolerance  Patient tolerated treatment well;No increased pain    Behavior During Therapy  WFL for tasks assessed/performed       Past Medical History:  Diagnosis Date  . Amenorrhea   . Migraine   . Migraines     Past Surgical History:  Procedure Laterality Date  . DILATION AND CURETTAGE OF UTERUS  09/2014 uterine infection form delivery 2015    There were no vitals filed for this visit.   Pelvic Floor Physical Therapy Treatment Note  SCREENING  Changes in medications, allergies, or medical history?: No    SUBJECTIVE  Patient reports: Hard-core cleaned for 13 hours yesterday had an all-time high pain last night. Vaginally she still does not feel normal. Had her most pleasurable experience being intimate, it went "OK". Feels like the mental/emotional part is lagging behind the physical. No lingering  pain following. Still has just a few hours of leakage afterward. This is as long as intercourse has lasted and she was able to participating more. Peeing the first time in the morning felt a little swollen and then back to normal. Felt    Precautions:  6.5 months postpartum   Pain update:  Location of pain: hip (vaginal pressure/discomfort) Current pain:  6/10 (3) Max pain:  9/10 (5) Least pain:  3/10 (3) Nature of pain: burning sensation- lateral cutaneous femoral nerve    Location of pain: neck Current pain:  5/10 (3) Max pain:  7/10 (5) Least pain:  3/10 (3) Nature of pain:   Patient Goals: #1 goal- would to be to feel better in the pelvic area. Scared to be initiate with husband again. Still feels a bulge. Would like to be intimate 3x/week vs 1x/week. Feels like "a leaky faucet" afterword up through the next day.   OBJECTIVE  Changes in: Posture/Observations:  R ASIS and PSIS very slightly high in standing.   Range of Motion/Flexibilty:  Unable to place back of R hand on posterior hip pre-treatment or reach overhead past ~ 90 degrees due to pain.   -Following treatment is able to reach to midline of back with R hand but still  Has pain at subacromial space with overhead reach.  Strength/MMT:  LE MMT:   Pelvic floor:  TTP to B posterior fourchette, all PFM  on R, other internal on L not assessed today. Anterior vaginal wall visible at the level of the introitus with bearing down. (from 12-22)  Abdominal:    Palpation: TTP to all rotator cuff muscles on the R with painful arc and positive Hawken's Kennedy test.  Gait Analysis:   INTERVENTIONS THIS SESSION: Manual: Performed TP release toto all rotator cuff muscles on the R to decrease spasm and pain and allow for improved balance of musculature and spinal alignement for improved function and decreased symptoms and to decrease tension on the neck to allow for decreased neck irritation and heightened neural  sensitivity.  Performed TPDN with a .30x43mm needle and standard approach as described below to decrease spasm and pain and allow for improved balance of musculature for improved function and decreased symptoms.  Self-care:  Educated on and instructed to do tennis ball release to B upper traps to decrease spasm and pain and allow for improved balance of musculature for improved function and decreased symptoms.     Total time: 60 minutes                     Trigger Point Dry Needling - 10/08/19 0001    Consent Given?  Yes    Education Handout Provided  No    Muscles Treated Upper Quadrant  Supraspinatus;Infraspinatus;Subscapularis;Teres minor    Supraspinatus Response  Twitch response elicited;Palpable increased muscle length    Infraspinatus Response  Twitch response elicited;Palpable increased muscle length    Subscapularis Response  Twitch response elicited;Palpable increased muscle length    Teres minor Response  Twitch response elicited;Palpable increased muscle length             PT Short Term Goals - 09/17/19 1327      PT SHORT TERM GOAL #1   Title  Patient will demonstrate improved pelvic alignment and balance of musculature surrounding the pelvis to facilitate decreased PFM spasms and decrease pelvic pain.    Baseline  posterior pelvic tilt, R rib shift, R scoliosis, spasms surrounding pelvis. 11-3 decreased R rib shift, decreased posterior pelvic tilt. 12/22: Pt. demonstrates ~80% improvement in pevic alignment and spasms but contiues to have some anterior pelvic tilt and mild R lumbar curve and spasms in the posterior R hip.    Time  5    Period  Weeks    Status  Achieved    Target Date  09/17/19      PT SHORT TERM GOAL #2   Title  Patient will demonstrate HEP x1 in the clinic to demonstrate understanding and proper form to allow for further improvement.    Baseline  Pt. lacks knowledge of therapeutic exercises that can help decrease her Sx. 11-3: pt  compliance with HEP    Time  5    Period  Weeks    Status  Achieved    Target Date  07/30/19      PT SHORT TERM GOAL #3   Title  Patient will demonstrate improved sitting and standing posture to demonstrate learning and decrease stress on the pelvic floor with functional activity.    Baseline  posterior pelvic tilt, R rib-shift; 11-3 decreaesd posterior pelvic tilt, improved R rib shift and improved. 12/22: some remaining anterior pelvic tilt, ~ 80% improved    Time  5    Period  Weeks    Status  Achieved    Target Date  09/17/19      PT SHORT TERM GOAL #4   Title  Patient will demonstrate a coordinated contraction, relaxation, and bulge of the pelvic floor muscles to demonstrate functional recruitment and motion and allow for further strengthening.    Baseline  Pt. demonstrates decreased recruitment and coordination of the PFM evidenced by her POP symptoms. 11-3 improved understanding of diphgragmatic breathing in relaxationship to PFM. 12/22: Pt. able to squeeze but continues to have trouble relaxing PFM and decreased strength with squeeze due to recurrent PFM spasms.    Time  5    Period  Weeks    Status  Achieved    Target Date  09/17/19        PT Long Term Goals - 09/17/19 1329      PT LONG TERM GOAL #1   Title  Patient will describe feeling of pressure no more than 5% of the time over the course of the past week to demonstrate improved recruitment and strength of the pelvic floor.    Baseline  Pt. having increased POP sensation with walking for exercise, having a BM, sneezing, etc. As of 12/22: pt. reports 6/10 "uncomfortableness" in the pelvic area most of the time As of 1/26: reports average of  3/10 "uncomfortableness" in the pelvic area most of the time.    Time  10    Period  Weeks    Status  On-going    Target Date  10/22/19      PT LONG TERM GOAL #2   Title  Patient will score at or below 32/300 on the PFDI, 25% on the Female NIH-CPSI and 10/35 on the NDI to  demonstrate a clinically meaningful decrease in disability and distress due to pelvic floor dysfunction.    Baseline  PFDI: 77/300, NDI: 19/35, Female NIH-CPSI: 23/43 (53%) As of 08/13/19: PFDI:111/300, Female NIH: 27/43, did not reassess NDI today.    Time  10    Period  Weeks    Status  On-going    Target Date  10/22/19      PT LONG TERM GOAL #3   Title  Patient will report no episodes of SUI over the course of the prior two weeks to demonstrate improved functional ability.    Baseline  Having SUI with cough, sneeze, etc. As of 12/22: is able to decrease SUI when she remembers to do posterior pelvic tilt and kegel before sneeze, less when she does still have leakage. As of 1/26: leakage is mostly after having intercourse, less often with sneeze etc.    Time  10    Period  Weeks    Status  On-going    Target Date  10/22/19      PT LONG TERM GOAL #4   Title  Patient will describe pain no greater than 1/10 during laying down to demonstrate improved ability to sleep for overall health.    Baseline  Pain as high as 10/10, worst when laying down in the neck, shoulders, r hip, (and migraines) As of 12-22: Pt. describes much less R hip and neck pain as well as less frequent migraines, high of 7/10 vaginally this week but it was short-lived and resolved As of 1/26:    Time  10    Period  Weeks    Status  On-going            Plan - 10/08/19 1308    Clinical Impression Statement  Pt. Responded well to all interventions today, demonstrating improved ROM and decreased spasm and pain as well as understanding and correct performance of all education  and exercises provided today. They will continue to benefit from skilled physical therapy to work toward remaining goals and maximize function as well as decrease likelihood of symptom increase or recurrence.     PT Next Visit Plan  Review rotator cuff stretches, further explore neck/shoulder and Dn PRN Further MFR around R hip and shoulder/cupping?  Review pelvic tilts with pillow, soda-can theory, prolapse with bearing down- symptom recreation?; reassess pelvic alignment; DN v TP release; teach self or partner STM techniques PRN; glute med stability;    PT Home Exercise Plan  R rib shift correction; diaphgramatic breathing; R QL stretch; log rolling and STS edu; side plank; tennis ball TP release; Prone hip-flexor stretch/hold-release; seated and hooklying chin tucks; bridges; given info on internal release and self infernal release materials, pre-squeeze and sneeze, chest stretch, scapular retraction/depression, seated and supine pelvic tilts with/without pillow, rotator cuff program.    Consulted and Agree with Plan of Care  Patient       Patient will benefit from skilled therapeutic intervention in order to improve the following deficits and impairments:     Visit Diagnosis: Other muscle spasm  Abnormal posture  Other idiopathic scoliosis, thoracolumbar region     Problem List Patient Active Problem List   Diagnosis Date Noted  . Sty, external 05/14/2019  . Plantar fasciitis, bilateral 07/19/2017  . Sebaceous cyst of right axilla 07/19/2017  . Overweight (BMI 25.0-29.9) 04/28/2016  . Migraines 04/04/2016   Cleophus Molt DPT, ATC Cleophus Molt 10/08/2019, 1:15 PM  [place] [facility] MAIN [facility] SERVICES [address] [place], [place], [address] Phone: [phone]   Fax:  [phone]  Name: OVETTA BAZZANO MRN: [id number] Date of Birth: August 18, 1986

## 2019-10-09 ENCOUNTER — Other Ambulatory Visit: Payer: Self-pay

## 2019-10-09 ENCOUNTER — Ambulatory Visit: Payer: Medicaid Other

## 2019-10-09 DIAGNOSIS — M62838 Other muscle spasm: Secondary | ICD-10-CM

## 2019-10-09 DIAGNOSIS — R293 Abnormal posture: Secondary | ICD-10-CM

## 2019-10-09 DIAGNOSIS — M4125 Other idiopathic scoliosis, thoracolumbar region: Secondary | ICD-10-CM

## 2019-10-09 NOTE — Therapy (Signed)
[facility] MAIN [facility] SERVICES [address] [place], [place], [address] Phone: [phone]   Fax:  [phone]  Physical Therapy Treatment  The patient has been informed of current processes in place at Outpatient Rehab to protect patients from Covid-19 exposure including social distancing, schedule modifications, and new cleaning procedures. After discussing their particular risk with a therapist based on the patient's personal risk factors, the patient has decided to proceed with in-person therapy.   Patient Details  Name: Madison Randall MRN: [id number] Date of Birth: 26-Jun-1986 No data recorded  Encounter Date: 10/09/2019  PT End of Session - 10/09/19 1341    Visit Number  14    Number of Visits  21    Date for PT Re-Evaluation  10/22/19    Authorization Type  MCAID    Authorization Time Period  08/13/2019 through 11/21/33  (re-cert)    3-6/1-44  [name])    Authorization - Visit Number  3    Authorization - Number of Visits  10    PT Start Time  1340    Activity Tolerance  Patient tolerated treatment well;No increased pain    Behavior During Therapy  WFL for tasks assessed/performed       Past Medical History:  Diagnosis Date  . Amenorrhea   . Migraine   . Migraines     Past Surgical History:  Procedure Laterality Date  . DILATION AND CURETTAGE OF UTERUS  09/2014 uterine infection form delivery 2015    There were no vitals filed for this visit.    Pelvic Floor Physical Therapy Treatment Note  SCREENING  Changes in medications, allergies, or medical history?: No    SUBJECTIVE  Patient reports: Her uncle is out of the hospital and is requiring a lot of care, this has added some stress. Her shoulder hurt really bad on Monday, she was not using the R arm at all, only the Left due to pain. Has been using rice-sock and deep-blue. Pain radiates all the way into the R arm. Was intimate twice and it was "a little bit like  normal" though she is only staying in the one position that she feels comfortable. Does not think she had any leakage. Less discomfort (3/10 vs. 10/10) with putting one leg up in the shower. Was able to blow her nose on the toilet with no repercussions. Cannot "just get up" out of the floor anymore, has to put the L LE forward first and then push up in a lunge through the R UE.  Precautions:  6.5 months postpartum   Pain update:  Location of pain: hip R shoulder (vaginal pressure/discomfort) Current pain:  4/10 (0) Max pain:  10/10 (3) Least pain:  3/10 (0) Nature of pain: "feels like poison"  *3/10 following  Location of pain: R hip/back Current pain:  4/10 Max pain:  7/10  Least pain:  3/10 Nature of pain: dull achy  Patient Goals: #1 goal- would to be to feel better in the pelvic area. Scared to be initiate with husband again. Still feels a bulge. Would like to be intimate 3x/week vs 1x/week. Feels like "a leaky faucet" afterword up through the next day.   OBJECTIVE  Changes in: Posture/Observations:  R ASIS and PSIS very slightly high in standing.   Range of Motion/Flexibilty:  Unable to place back of R hand on posterior hip pre-treatment or reach overhead past ~ 90 degrees due to pain.   -no change in IR but  increased shoulder flexion and ABD pain-free AROM to ~ 120deg.  Strength/MMT:  LE MMT:   Pelvic floor:  TTP to B posterior fourchette, all PFM on R, other internal on L not assessed today. Anterior vaginal wall visible at the level of the introitus with bearing down. (from 12-22)  Abdominal:    Palpation: TTP to Upper trapezius, anterior and middle Deltoid on the R.  Gait Analysis:   INTERVENTIONS THIS SESSION: Manual: Performed TP release to Upper trapezius, anterior and middle Deltoid on the R to decrease spasm and pain and allow for improved balance of musculature and spinal alignement for improved function and decreased symptoms and to decrease tension  on the neck to allow for decreased neck irritation and heightened neural sensitivity.  Dry-needling: Performed TPDN with a .30x10mm needle and standard approach as described below to decrease spasm and pain and allow for improved balance of musculature for improved function and decreased symptoms.  Total time: 60 minutes                              Trigger Point Dry Needling - 10/09/19 0001    Consent Given?  Yes    Education Handout Provided  No    Muscles Treated Head and Neck  Upper trapezius    Muscles Treated Upper Quadrant  Deltoid    Dry Needling Comments  right    Upper Trapezius Response  Twitch reponse elicited;Palpable increased muscle length    Deltoid Response  Twitch response elicited;Palpable increased muscle length             PT Short Term Goals - 09/17/19 1327      PT SHORT TERM GOAL #1   Title  Patient will demonstrate improved pelvic alignment and balance of musculature surrounding the pelvis to facilitate decreased PFM spasms and decrease pelvic pain.    Baseline  posterior pelvic tilt, R rib shift, R scoliosis, spasms surrounding pelvis. 11-3 decreased R rib shift, decreased posterior pelvic tilt. 12/22: Pt. demonstrates ~80% improvement in pevic alignment and spasms but contiues to have some anterior pelvic tilt and mild R lumbar curve and spasms in the posterior R hip.    Time  5    Period  Weeks    Status  Achieved    Target Date  09/17/19      PT SHORT TERM GOAL #2   Title  Patient will demonstrate HEP x1 in the clinic to demonstrate understanding and proper form to allow for further improvement.    Baseline  Pt. lacks knowledge of therapeutic exercises that can help decrease her Sx. 11-3: pt compliance with HEP    Time  5    Period  Weeks    Status  Achieved    Target Date  07/30/19      PT SHORT TERM GOAL #3   Title  Patient will demonstrate improved sitting and standing posture to demonstrate learning and decrease  stress on the pelvic floor with functional activity.    Baseline  posterior pelvic tilt, R rib-shift; 11-3 decreaesd posterior pelvic tilt, improved R rib shift and improved. 12/22: some remaining anterior pelvic tilt, ~ 80% improved    Time  5    Period  Weeks    Status  Achieved    Target Date  09/17/19      PT SHORT TERM GOAL #4   Title  Patient will demonstrate a coordinated contraction, relaxation, and bulge of  the pelvic floor muscles to demonstrate functional recruitment and motion and allow for further strengthening.    Baseline  Pt. demonstrates decreased recruitment and coordination of the PFM evidenced by her POP symptoms. 11-3 improved understanding of diphgragmatic breathing in relaxationship to PFM. 12/22: Pt. able to squeeze but continues to have trouble relaxing PFM and decreased strength with squeeze due to recurrent PFM spasms.    Time  5    Period  Weeks    Status  Achieved    Target Date  09/17/19        PT Long Term Goals - 09/17/19 1329      PT LONG TERM GOAL #1   Title  Patient will describe feeling of pressure no more than 5% of the time over the course of the past week to demonstrate improved recruitment and strength of the pelvic floor.    Baseline  Pt. having increased POP sensation with walking for exercise, having a BM, sneezing, etc. As of 12/22: pt. reports 6/10 "uncomfortableness" in the pelvic area most of the time As of 1/26: reports average of  3/10 "uncomfortableness" in the pelvic area most of the time.    Time  10    Period  Weeks    Status  On-going    Target Date  10/22/19      PT LONG TERM GOAL #2   Title  Patient will score at or below 32/300 on the PFDI, 25% on the Female NIH-CPSI and 10/35 on the NDI to demonstrate a clinically meaningful decrease in disability and distress due to pelvic floor dysfunction.    Baseline  PFDI: 77/300, NDI: 19/35, Female NIH-CPSI: 23/43 (53%) As of 08/13/19: PFDI:111/300, Female NIH: 27/43, did not reassess NDI  today.    Time  10    Period  Weeks    Status  On-going    Target Date  10/22/19      PT LONG TERM GOAL #3   Title  Patient will report no episodes of SUI over the course of the prior two weeks to demonstrate improved functional ability.    Baseline  Having SUI with cough, sneeze, etc. As of 12/22: is able to decrease SUI when she remembers to do posterior pelvic tilt and kegel before sneeze, less when she does still have leakage. As of 1/26: leakage is mostly after having intercourse, less often with sneeze etc.    Time  10    Period  Weeks    Status  On-going    Target Date  10/22/19      PT LONG TERM GOAL #4   Title  Patient will describe pain no greater than 1/10 during laying down to demonstrate improved ability to sleep for overall health.    Baseline  Pain as high as 10/10, worst when laying down in the neck, shoulders, r hip, (and migraines) As of 12-22: Pt. describes much less R hip and neck pain as well as less frequent migraines, high of 7/10 vaginally this week but it was short-lived and resolved As of 1/26:    Time  10    Period  Weeks    Status  On-going            Plan - 10/09/19 1342    Clinical Impression Statement  Pt. Responded well to all interventions today, demonstrating decreased spasm and pain in the R shoulder, improved shoulder flexion and scaption AROM as well as understanding and correct performance of all education and exercises provided  today. They will continue to benefit from skilled physical therapy to work toward remaining goals and maximize function as well as decrease likelihood of symptom increase or recurrence.     PT Next Visit Plan  Review rotator cuff stretches, further explore neck/shoulder and Dn PRN Further MFR around R hip and shoulder/cupping? Review pelvic tilts with pillow, soda-can theory, prolapse with bearing down- symptom recreation?; reassess pelvic alignment; DN v TP release; teach self or partner STM techniques PRN; glute med  stability;    PT Home Exercise Plan  R rib shift correction; diaphgramatic breathing; R QL stretch; log rolling and STS edu; side plank; tennis ball TP release; Prone hip-flexor stretch/hold-release; seated and hooklying chin tucks; bridges; given info on internal release and self infernal release materials, pre-squeeze and sneeze, chest stretch, scapular retraction/depression, seated and supine pelvic tilts with/without pillow, rotator cuff program.    Consulted and Agree with Plan of Care  Patient       Patient will benefit from skilled therapeutic intervention in order to improve the following deficits and impairments:     Visit Diagnosis: Other muscle spasm  Abnormal posture  Other idiopathic scoliosis, thoracolumbar region     Problem List Patient Active Problem List   Diagnosis Date Noted  . Sty, external 05/14/2019  . Plantar fasciitis, bilateral 07/19/2017  . Sebaceous cyst of right axilla 07/19/2017  . Overweight (BMI 25.0-29.9) 04/28/2016  . Migraines 04/04/2016   Willa Rough DPT, ATC Willa Rough 10/09/2019, 2:56 PM  [place] MAIN [facility] SERVICES [address] [place], [place], [address] Phone: [phone]   Fax:  [phone]  Name: SAMARAH HOGLE MRN: [id number] Date of Birth: 1986/07/24

## 2019-10-16 ENCOUNTER — Ambulatory Visit: Payer: Medicaid Other

## 2019-10-16 ENCOUNTER — Other Ambulatory Visit: Payer: Self-pay

## 2019-10-16 DIAGNOSIS — M4125 Other idiopathic scoliosis, thoracolumbar region: Secondary | ICD-10-CM

## 2019-10-16 DIAGNOSIS — R293 Abnormal posture: Secondary | ICD-10-CM

## 2019-10-16 DIAGNOSIS — M62838 Other muscle spasm: Secondary | ICD-10-CM | POA: Diagnosis not present

## 2019-10-16 NOTE — Patient Instructions (Signed)
   Hold for 5 deep breaths, use your hand to help bring head back up to neutral. Do this for ~ 3 days seriously and then you can decrease to ~ 2 times per week or briefly after breastfeeding.  Make sure you are doing chin-tucks, shoulder squeezes, and chest stretches in the doorway.

## 2019-10-16 NOTE — Therapy (Signed)
Madison Randall [facility] MAIN [facility] SERVICES [address] [place], [place], [address] Phone: [phone]   Fax:  [phone]  Physical Therapy Treatment  The patient has been informed of current processes in place at Outpatient Rehab to protect patients from Covid-19 exposure including social distancing, schedule modifications, and new cleaning procedures. After discussing their particular risk with a therapist based on the patient's personal risk factors, the patient has decided to proceed with in-person therapy.   Patient Details  Name: Madison Randall MRN: [id number] Date of Birth: May 30, 1986 No data recorded  Encounter Date: 10/16/2019  PT End of Session - 10/16/19 1447    Visit Number  15    Number of Visits  21    Date for PT Re-Evaluation  10/22/19    Authorization Type  MCAID    Authorization Time Period  08/13/2019 through 0/2/77  (re-cert)    [birth year]  Madison Randall)    Authorization - Visit Number  4    Authorization - Number of Visits  10    PT Start Time  1345    PT Stop Time  1445    PT Time Calculation (min)  60 min    Activity Tolerance  Patient tolerated treatment well;No increased pain    Behavior During Therapy  WFL for tasks assessed/performed       Past Medical History:  Diagnosis Date  . Amenorrhea   . Migraine   . Migraines     Past Surgical History:  Procedure Laterality Date  . DILATION AND CURETTAGE OF UTERUS  09/2014 uterine infection form delivery 2015    There were no vitals filed for this visit.   Pelvic Floor Physical Therapy Treatment Note  SCREENING  Changes in medications, allergies, or medical history?: No    SUBJECTIVE  Patient reports: Was able to "be intimate" on her back without pain/discomfort. Her shoulders are "trash". Has been using her band or 2 lb. Weights for shoulder strengthening exercise. The overhead press hurts. In a reclined position she gets increased pain on the L shoulder that  builds and builds. It will decrease if she turns away from her back. The pain "runs" up into the neck. She is still able to turn her neck well following last session.  Wakes up 6/7 nights per week with a headache and feels like it is sinus related because she often feels pressure in her face as well.  "I stay on Mucinex" since she was pregnant with Madison Randall (youngest).   Precautions:  6.5 months postpartum   Pain update:  Location of pain: hip R shoulder (vaginal pressure/discomfort) Current pain:  4/10 (0) Max pain:  10/10 (3) Least pain:  3/10 (0) Nature of pain: "feels like poison"  *0/10 following treatment.  Location of pain: R hip/back Current pain:  4/10 Max pain:  7/10  Least pain:  3/10 Nature of pain: dull achy  Patient Goals: #1 goal- would to be to feel better in the pelvic area. Scared to be initiate with husband again. Still feels a bulge. Would like to be intimate 3x/week vs 1x/week. Feels like "a leaky faucet" afterword up through the next day.   OBJECTIVE  Changes in: Posture/Observations:  Rounded shoulders, slight FHP.  Range of Motion/Flexibilty:  Decreased mobility at ~ C3-4 in L to R direction.  Strength/MMT:  LE MMT:   Pelvic floor:  TTP to B posterior fourchette, all PFM on R, other internal on L not assessed today. Anterior vaginal wall visible  at the level of the introitus with bearing down. (from 12-22)  Abdominal:    Palpation: TTP to B SCM, lateral pterygoids, Supraspinatus, and pec major/minor and masseter and temporalis on the R.   Gait Analysis:   INTERVENTIONS THIS SESSION: Manual: Performed TP release to B SCM, lateral pterygoids, Supraspinatus, and pec major/minor and masseter and temporalis on the R followed by L to R grade 3 mobs to decrease spasm and pain and allow for improved balance of musculature and spinal alignement for improved function and decreased symptoms and to decrease tension on the neck to allow for decreased neck  irritation and heightened neural sensitivity. Therex: educated on and practiced SCM stretch and reviewed chest stretch To maintain and improve muscle length and allow for improved balance of musculature for long-term symptom relief. Reviewed chin-tucks and scapular retractions to improve spinal alignment, mobility, and stability to allow for maintenence of decreased pain/spasm   Total time: 60 minutes                            PT Short Term Goals - 09/17/19 1327      PT SHORT TERM GOAL #1   Title  Patient will demonstrate improved pelvic alignment and balance of musculature surrounding the pelvis to facilitate decreased PFM spasms and decrease pelvic pain.    Baseline  posterior pelvic tilt, R rib shift, R scoliosis, spasms surrounding pelvis. 11-3 decreased R rib shift, decreased posterior pelvic tilt. 12/22: Pt. demonstrates ~80% improvement in pevic alignment and spasms but contiues to have some anterior pelvic tilt and mild R lumbar curve and spasms in the posterior R hip.    Time  5    Period  Weeks    Status  Achieved    Target Date  09/17/19      PT SHORT TERM GOAL #2   Title  Patient will demonstrate HEP x1 in the clinic to demonstrate understanding and proper form to allow for further improvement.    Baseline  Pt. lacks knowledge of therapeutic exercises that can help decrease her Sx. 11-3: pt compliance with HEP    Time  5    Period  Weeks    Status  Achieved    Target Date  07/30/19      PT SHORT TERM GOAL #3   Title  Patient will demonstrate improved sitting and standing posture to demonstrate learning and decrease stress on the pelvic floor with functional activity.    Baseline  posterior pelvic tilt, R rib-shift; 11-3 decreaesd posterior pelvic tilt, improved R rib shift and improved. 12/22: some remaining anterior pelvic tilt, ~ 80% improved    Time  5    Period  Weeks    Status  Achieved    Target Date  09/17/19      PT SHORT TERM GOAL #4    Title  Patient will demonstrate a coordinated contraction, relaxation, and bulge of the pelvic floor muscles to demonstrate functional recruitment and motion and allow for further strengthening.    Baseline  Pt. demonstrates decreased recruitment and coordination of the PFM evidenced by her POP symptoms. 11-3 improved understanding of diphgragmatic breathing in relaxationship to PFM. 12/22: Pt. able to squeeze but continues to have trouble relaxing PFM and decreased strength with squeeze due to recurrent PFM spasms.    Time  5    Period  Weeks    Status  Achieved    Target Date  09/17/19  PT Long Term Goals - 09/17/19 1329      PT LONG TERM GOAL #1   Title  Patient will describe feeling of pressure no more than 5% of the time over the course of the past week to demonstrate improved recruitment and strength of the pelvic floor.    Baseline  Pt. having increased POP sensation with walking for exercise, having a BM, sneezing, etc. As of 12/22: pt. reports 6/10 "uncomfortableness" in the pelvic area most of the time As of 1/26: reports average of  3/10 "uncomfortableness" in the pelvic area most of the time.    Time  10    Period  Weeks    Status  On-going    Target Date  10/22/19      PT LONG TERM GOAL #2   Title  Patient will score at or below 32/300 on the PFDI, 25% on the Female NIH-CPSI and 10/35 on the NDI to demonstrate a clinically meaningful decrease in disability and distress due to pelvic floor dysfunction.    Baseline  PFDI: 77/300, NDI: 19/35, Female NIH-CPSI: 23/43 (53%) As of 08/13/19: PFDI:111/300, Female NIH: 27/43, did not reassess NDI today.    Time  10    Period  Weeks    Status  On-going    Target Date  10/22/19      PT LONG TERM GOAL #3   Title  Patient will report no episodes of SUI over the course of the prior two weeks to demonstrate improved functional ability.    Baseline  Having SUI with cough, sneeze, etc. As of 12/22: is able to decrease SUI when she  remembers to do posterior pelvic tilt and kegel before sneeze, less when she does still have leakage. As of 1/26: leakage is mostly after having intercourse, less often with sneeze etc.    Time  10    Period  Weeks    Status  On-going    Target Date  10/22/19      PT LONG TERM GOAL #4   Title  Patient will describe pain no greater than 1/10 during laying down to demonstrate improved ability to sleep for overall health.    Baseline  Pain as high as 10/10, worst when laying down in the neck, shoulders, r hip, (and migraines) As of 12-22: Pt. describes much less R hip and neck pain as well as less frequent migraines, high of 7/10 vaginally this week but it was short-lived and resolved As of 1/26:    Time  10    Period  Weeks    Status  On-going            Plan - 10/16/19 1447    Clinical Impression Statement  Pt. Responded well to all interventions today, demonstrating improved cervical mobility, decreased spasms and pain, as well as understanding and correct performance of all education and exercises provided today. They will continue to benefit from skilled physical therapy to work toward remaining goals and maximize function as well as decrease likelihood of symptom increase or recurrence.     PT Next Visit Plan  Review rotator cuff stretches, further explore neck/shoulder and Dn PRN Further MFR around R hip and shoulder/cupping? Review pelvic tilts with pillow, soda-can theory, prolapse with bearing down- symptom recreation?; reassess pelvic alignment; DN v TP release; teach self or partner STM techniques PRN; glute med stability;    PT Home Exercise Plan  R rib shift correction; diaphgramatic breathing; R QL stretch; log rolling and STS  edu; side plank; tennis ball TP release; Prone hip-flexor stretch/hold-release; seated and hooklying chin tucks; bridges; given info on internal release and self infernal release materials, pre-squeeze and sneeze, chest stretch, scapular  retraction/depression, seated and supine pelvic tilts with/without pillow, rotator cuff program, SCM stretch.    Consulted and Agree with Plan of Care  Patient       Patient will benefit from skilled therapeutic intervention in order to improve the following deficits and impairments:     Visit Diagnosis: Other muscle spasm  Abnormal posture  Other idiopathic scoliosis, thoracolumbar region     Problem List Patient Active Problem List   Diagnosis Date Noted  . Sty, external 05/14/2019  . Plantar fasciitis, bilateral 07/19/2017  . Sebaceous cyst of right axilla 07/19/2017  . Overweight (BMI 25.0-29.9) 04/28/2016  . Migraines 04/04/2016   Cleophus Molt DPT, ATC Cleophus Molt 10/16/2019, 2:55 PM  [place] [facility] MAIN [facility] SERVICES [address] [place], [place], [address] Phone: [phone]   Fax:  [phone]  Name: Madison Randall MRN: [id number] Date of Birth: Jan 06, 1986

## 2019-10-23 ENCOUNTER — Ambulatory Visit: Payer: Medicaid Other | Attending: Obstetrics and Gynecology

## 2019-10-23 ENCOUNTER — Other Ambulatory Visit: Payer: Self-pay

## 2019-10-23 DIAGNOSIS — M4125 Other idiopathic scoliosis, thoracolumbar region: Secondary | ICD-10-CM | POA: Diagnosis present

## 2019-10-23 DIAGNOSIS — M62838 Other muscle spasm: Secondary | ICD-10-CM | POA: Diagnosis present

## 2019-10-23 DIAGNOSIS — R293 Abnormal posture: Secondary | ICD-10-CM | POA: Diagnosis present

## 2019-10-23 NOTE — Therapy (Signed)
[place] MAIN [facility] SERVICES [address] [place], [place], [address] Phone: [phone]   Fax:  [phone]  Physical Therapy Treatment  The patient has been informed of current processes in place at Outpatient Rehab to protect patients from Covid-19 exposure including social distancing, schedule modifications, and new cleaning procedures. After discussing their particular risk with a therapist based on the patient's personal risk factors, the patient has decided to proceed with in-person therapy.   Patient Details  Name: Madison Randall MRN: [id number] Date of Birth: 1986-03-09 No data recorded  Encounter Date: 10/23/2019  PT End of Session - 10/24/19 2142    Visit Number  16    Number of Visits  21    Date for PT Re-Evaluation  10/22/19    Authorization Type  MCA[employer]    Authorization Time Period  08/13/2019 through 04/26/17  (re-cert)    8-4/1-66  Iver Nestle Josem Kaufmann)    Authorization - Visit Number  5    Authorization - Number of Visits  10    Progress Note Due on Visit  10    PT Start Time  0630    PT Stop Time  1431    PT Time Calculation (min)  53 min    Activity Tolerance  Patient tolerated treatment well;No increased pain    Behavior During Therapy  WFL for tasks assessed/performed       Past Medical History:  Diagnosis Date  . Amenorrhea   . Migraine   . Migraines     Past Surgical History:  Procedure Laterality Date  . DILATION AND CURETTAGE OF UTERUS  09/2014 uterine infection form delivery 2015    There were no vitals filed for this visit.     Pelvic Floor Physical Therapy Treatment Note  SCREENING  Changes in medications, allergies, or medical history?: No    SUBJECTIVE  Patient reports: "I hate my shoulders". Since starting her period she has had more of the discomfort in the pelvis, thinks it is just because of her cycle. Has not tried intercourse. After last week her sinuses opened up a lot.   Precautions:   6.5 months postpartum   Pain update:  Location of pain: hip R shoulder (vaginal pressure/discomfort) Current pain:  4/10 (0) Max pain:  10/10 (3) Least pain:  3/10 (0) Nature of pain: "feels like poison"  *0/10 following treatment.  Location of pain: R hip/back Current pain:  4/10 Max pain:  7/10  Least pain:  3/10 Nature of pain: dull achy  Patient Goals: #1 goal- would to be to feel better in the pelvic area. Scared to be initiate with husband again. Still feels a bulge. Would like to be intimate 3x/week vs 1x/week. Feels like "a leaky faucet" afterword up through the next day.   OBJECTIVE  Changes in: Posture/Observations:  Rounded shoulders, slight FHP.  Range of Motion/Flexibilty:  Decreased shoulder flexion and downward scapular rotation on R>L  Strength/MMT:  LE MMT:   Pelvic floor:  TTP to B posterior fourchette, all PFM on R, other internal on L not assessed today. Anterior vaginal wall visible at the level of the introitus with bearing down. (from 12-22)  Abdominal:    Palpation: TTP to R levator scapulae, rhomboids, supraspinatus, infraspinatus, and subscapularis  Gait Analysis:   INTERVENTIONS THIS SESSION: Manual:  Performed TP release to R levator scapulae, rhomboids, supraspinatus, infraspinatus, and subscapularis to decrease spasm and pain and allow for improved balance of musculature for improved function and  decreased symptoms.  Therex: educated on and practicedon I's Y's T's and W's to improve strength of muscles opposing tight musculature to allow reciprocal inhibition to improve balance of musculature surrounding the pelvis and improve overall posture for optimal musculature length-tension relationship and function.  Total time: 60 minutes                          PT Short Term Goals - 09/17/19 1327      PT SHORT TERM GOAL #1   Title  Patient will demonstrate improved pelvic alignment and balance of musculature  surrounding the pelvis to facilitate decreased PFM spasms and decrease pelvic pain.    Baseline  posterior pelvic tilt, R rib shift, R scoliosis, spasms surrounding pelvis. 11-3 decreased R rib shift, decreased posterior pelvic tilt. 12/22: Pt. demonstrates ~80% improvement in pevic alignment and spasms but contiues to have some anterior pelvic tilt and mild R lumbar curve and spasms in the posterior R hip.    Time  5    Period  Weeks    Status  Achieved    Target Date  09/17/19      PT SHORT TERM GOAL #2   Title  Patient will demonstrate HEP x1 in the clinic to demonstrate understanding and proper form to allow for further improvement.    Baseline  Pt. lacks knowledge of therapeutic exercises that can help decrease her Sx. 11-3: pt compliance with HEP    Time  5    Period  Weeks    Status  Achieved    Target Date  07/30/19      PT SHORT TERM GOAL #3   Title  Patient will demonstrate improved sitting and standing posture to demonstrate learning and decrease stress on the pelvic floor with functional activity.    Baseline  posterior pelvic tilt, R rib-shift; 11-3 decreaesd posterior pelvic tilt, improved R rib shift and improved. 12/22: some remaining anterior pelvic tilt, ~ 80% improved    Time  5    Period  Weeks    Status  Achieved    Target Date  09/17/19      PT SHORT TERM GOAL #4   Title  Patient will demonstrate a coordinated contraction, relaxation, and bulge of the pelvic floor muscles to demonstrate functional recruitment and motion and allow for further strengthening.    Baseline  Pt. demonstrates decreased recruitment and coordination of the PFM evidenced by her POP symptoms. 11-3 improved understanding of diphgragmatic breathing in relaxationship to PFM. 12/22: Pt. able to squeeze but continues to have trouble relaxing PFM and decreased strength with squeeze due to recurrent PFM spasms.    Time  5    Period  Weeks    Status  Achieved    Target Date  09/17/19        PT  Long Term Goals - 09/17/19 1329      PT LONG TERM GOAL #1   Title  Patient will describe feeling of pressure no more than 5% of the time over the course of the past week to demonstrate improved recruitment and strength of the pelvic floor.    Baseline  Pt. having increased POP sensation with walking for exercise, having a BM, sneezing, etc. As of 12/22: pt. reports 6/10 "uncomfortableness" in the pelvic area most of the time As of 1/26: reports average of  3/10 "uncomfortableness" in the pelvic area most of the time.    Time  10  Period  Weeks    Status  On-going    Target Date  10/22/19      PT LONG TERM GOAL #2   Title  Patient will score at or below 32/300 on the PFDI, 25% on the Female NIH-CPSI and 10/35 on the NDI to demonstrate a clinically meaningful decrease in disability and distress due to pelvic floor dysfunction.    Baseline  PFDI: 77/300, NDI: 19/35, Female NIH-CPSI: 23/43 (53%) As of 08/13/19: PFDI:111/300, Female NIH: 27/43, did not reassess NDI today.    Time  10    Period  Weeks    Status  On-going    Target Date  10/22/19      PT LONG TERM GOAL #3   Title  Patient will report no episodes of SUI over the course of the prior two weeks to demonstrate improved functional ability.    Baseline  Having SUI with cough, sneeze, etc. As of 12/22: is able to decrease SUI when she remembers to do posterior pelvic tilt and kegel before sneeze, less when she does still have leakage. As of 1/26: leakage is mostly after having intercourse, less often with sneeze etc.    Time  10    Period  Weeks    Status  On-going    Target Date  10/22/19      PT LONG TERM GOAL #4   Title  Patient will describe pain no greater than 1/10 during laying down to demonstrate improved ability to sleep for overall health.    Baseline  Pain as high as 10/10, worst when laying down in the neck, shoulders, r hip, (and migraines) As of 12-22: Pt. describes much less R hip and neck pain as well as less frequent  migraines, high of 7/10 vaginally this week but it was short-lived and resolved As of 1/26:    Time  10    Period  Weeks    Status  On-going            Plan - 10/24/19 2153    Clinical Impression Statement  Pt. Responded well to all interventions today, demonstrating improved pain, spasms, and increased ROM, as well as understanding and correct performance of all education and exercises provided today. They will continue to benefit from skilled physical therapy to work toward remaining goals and maximize function as well as decrease likelihood of symptom increase or recurrence.     PT Next Visit Plan  Review rotator cuff stretches, further explore neck/shoulder and Dn PRN Further MFR around R hip and shoulder/cupping? Review pelvic tilts with pillow, soda-can theory, prolapse with bearing down- symptom recreation?; reassess pelvic alignment; DN v TP release; teach self or partner STM techniques PRN; glute med stability;    PT Home Exercise Plan  R rib shift correction; diaphgramatic breathing; R QL stretch; log rolling and STS edu; side plank; tennis ball TP release; Prone hip-flexor stretch/hold-release; seated and hooklying chin tucks; bridges; given info on internal release and self infernal release materials, pre-squeeze and sneeze, chest stretch, scapular retraction/depression, seated and supine pelvic tilts with/without pillow, rotator cuff program, SCM stretch.    Consulted and Agree with Plan of Care  Patient       Patient will benefit from skilled therapeutic intervention in order to improve the following deficits and impairments:     Visit Diagnosis: Other muscle spasm  Abnormal posture  Other idiopathic scoliosis, thoracolumbar region     Problem List Patient Active Problem List   Diagnosis Date Noted  .  Sty, external 05/14/2019  . Plantar fasciitis, bilateral 07/19/2017  . Sebaceous cyst of right axilla 07/19/2017  . Overweight (BMI 25.0-29.9) 04/28/2016  .  Migraines 04/04/2016   Cleophus Molt DPT, ATC Cleophus Molt 10/24/2019, 9:58 PM  [place] [facility] MAIN [facility] SERVICES [address] [place], [place], [address] Phone: [phone]   Fax:  [phone]  Name: Madison Randall MRN: [id number] Date of Birth: [birth year]

## 2019-10-23 NOTE — Patient Instructions (Addendum)
  Do 2x10 for each letter. Make sure to keep the shoulders down away from your shoulders with each exercise.

## 2019-10-30 ENCOUNTER — Ambulatory Visit: Payer: Medicaid Other

## 2019-11-06 ENCOUNTER — Ambulatory Visit: Payer: Medicaid Other

## 2019-11-13 ENCOUNTER — Ambulatory Visit: Payer: Medicaid Other

## 2019-11-13 ENCOUNTER — Other Ambulatory Visit: Payer: Self-pay

## 2019-11-13 DIAGNOSIS — R293 Abnormal posture: Secondary | ICD-10-CM

## 2019-11-13 DIAGNOSIS — M62838 Other muscle spasm: Secondary | ICD-10-CM

## 2019-11-13 DIAGNOSIS — M4125 Other idiopathic scoliosis, thoracolumbar region: Secondary | ICD-10-CM

## 2019-11-13 NOTE — Therapy (Signed)
[place] MAIN [facility] SERVICES [address] [place], [place], [address] Phone: [phone]   Fax:  [phone]  Physical Therapy Treatment  The patient has been informed of current processes in place at Outpatient Rehab to protect patients from Covid-19 exposure including social distancing, schedule modifications, and new cleaning procedures. After discussing their particular risk with a therapist based on the patient's personal risk factors, the patient has decided to proceed with in-person therapy.   Patient Details  Name: Madison Randall MRN: [id number] Date of Birth: 1986/08/10 No data recorded  Encounter Date: 11/13/2019    Past Medical History:  Diagnosis Date  . Amenorrhea   . Migraine   . Migraines     Past Surgical History:  Procedure Laterality Date  . DILATION AND CURETTAGE OF UTERUS  09/2014 uterine infection form delivery 2015    There were no vitals filed for this visit.  Pelvic Floor Physical Therapy Treatment Note  SCREENING  Changes in medications, allergies, or medical history?: No    SUBJECTIVE  Patient reports: She had covid, thought she had called and cancelled her appointments.  Her shoulders are still "jacked". Her L shoulder is hurting worse today than the R. Consistently did her exercises. Some pain with performing exercise.  Was able to "be intimate" 4 days in a row while she was on quarantine and it was "totally normal" with no side-effects!   Precautions:  6.5 months postpartum   Pain update:  Location of pain: L shoulder (vaginal pressure/discomfort) Current pain:  1/10 (0) Max pain:  10/10 (3) Least pain:  1/10 (0) Nature of pain: "feels like poison"  *0/10 following treatment.  Location of pain: B hips Current pain:  0/10 Max pain:  810  Least pain:  0/10 Nature of pain: burning,tingling  Patient Goals: #1 goal- would to be to feel better in the pelvic area. Scared to be initiate  with husband again. Still feels a bulge. Would like to be intimate 3x/week vs 1x/week. Feels like "a leaky faucet" afterword up through the next day.   OBJECTIVE  Changes in: Posture/Observations:  Rounded shoulders, slight FHP.  Range of Motion/Flexibilty:  Decreased shoulder flexion and downward scapular rotation on R>L  Strength/MMT:  LE MMT:   Pelvic floor:  TTP to B posterior fourchette, all PFM on R, other internal on L not assessed today. Anterior vaginal wall visible at the level of the introitus with bearing down. (from 12-22)  Abdominal:    Palpation: TTP to B Iliacus, L Pec major and Minor  Gait Analysis:   INTERVENTIONS THIS SESSION: Manual:  Performed TP release and STM to R L Pec major and minor, and B Iliacus to decrease spasm and pain and allow for improved balance of musculature for improved function and decreased symptoms.  Performed TPDN with a .30x176mm needle and standard approach as described below to decrease spasm and pain and allow for improved balance of musculature for improved function and decreased symptoms.  Therex: Educated on and practiced proposal pose hip-flexor stretch to decrease spasm and pain and allow for improved balance of musculature for improved function and decreased symptoms.   Total time: 60 minutes                     Trigger Point Dry Needling - 11/13/19 0001    Consent Given?  Yes    Education Handout Provided  No    Muscles Treated Back/Hip  Iliacus    Iliacus  Response  Twitch response elicited;Palpable increased muscle length             PT Short Term Goals - 09/17/19 1327      PT SHORT TERM GOAL #1   Title  Patient will demonstrate improved pelvic alignment and balance of musculature surrounding the pelvis to facilitate decreased PFM spasms and decrease pelvic pain.    Baseline  posterior pelvic tilt, R rib shift, R scoliosis, spasms surrounding pelvis. 11-3 decreased R rib shift, decreased  posterior pelvic tilt. 12/22: Pt. demonstrates ~80% improvement in pevic alignment and spasms but contiues to have some anterior pelvic tilt and mild R lumbar curve and spasms in the posterior R hip.    Time  5    Period  Weeks    Status  Achieved    Target Date  09/17/19      PT SHORT TERM GOAL #2   Title  Patient will demonstrate HEP x1 in the clinic to demonstrate understanding and proper form to allow for further improvement.    Baseline  Pt. lacks knowledge of therapeutic exercises that can help decrease her Sx. 11-3: pt compliance with HEP    Time  5    Period  Weeks    Status  Achieved    Target Date  07/30/19      PT SHORT TERM GOAL #3   Title  Patient will demonstrate improved sitting and standing posture to demonstrate learning and decrease stress on the pelvic floor with functional activity.    Baseline  posterior pelvic tilt, R rib-shift; 11-3 decreaesd posterior pelvic tilt, improved R rib shift and improved. 12/22: some remaining anterior pelvic tilt, ~ 80% improved    Time  5    Period  Weeks    Status  Achieved    Target Date  09/17/19      PT SHORT TERM GOAL #4   Title  Patient will demonstrate a coordinated contraction, relaxation, and bulge of the pelvic floor muscles to demonstrate functional recruitment and motion and allow for further strengthening.    Baseline  Pt. demonstrates decreased recruitment and coordination of the PFM evidenced by her POP symptoms. 11-3 improved understanding of diphgragmatic breathing in relaxationship to PFM. 12/22: Pt. able to squeeze but continues to have trouble relaxing PFM and decreased strength with squeeze due to recurrent PFM spasms.    Time  5    Period  Weeks    Status  Achieved    Target Date  09/17/19        PT Long Term Goals - 09/17/19 1329      PT LONG TERM GOAL #1   Title  Patient will describe feeling of pressure no more than 5% of the time over the course of the past week to demonstrate improved recruitment and  strength of the pelvic floor.    Baseline  Pt. having increased POP sensation with walking for exercise, having a BM, sneezing, etc. As of 12/22: pt. reports 6/10 "uncomfortableness" in the pelvic area most of the time As of 1/26: reports average of  3/10 "uncomfortableness" in the pelvic area most of the time.    Time  10    Period  Weeks    Status  On-going    Target Date  10/22/19      PT LONG TERM GOAL #2   Title  Patient will score at or below 32/300 on the PFDI, 25% on the Female NIH-CPSI and 10/35 on the NDI to demonstrate  a clinically meaningful decrease in disability and distress due to pelvic floor dysfunction.    Baseline  PFDI: 77/300, NDI: 19/35, Female NIH-CPSI: 23/43 (53%) As of 08/13/19: PFDI:111/300, Female NIH: 27/43, did not reassess NDI today.    Time  10    Period  Weeks    Status  On-going    Target Date  10/22/19      PT LONG TERM GOAL #3   Title  Patient will report no episodes of SUI over the course of the prior two weeks to demonstrate improved functional ability.    Baseline  Having SUI with cough, sneeze, etc. As of 12/22: is able to decrease SUI when she remembers to do posterior pelvic tilt and kegel before sneeze, less when she does still have leakage. As of 1/26: leakage is mostly after having intercourse, less often with sneeze etc.    Time  10    Period  Weeks    Status  On-going    Target Date  10/22/19      PT LONG TERM GOAL #4   Title  Patient will describe pain no greater than 1/10 during laying down to demonstrate improved ability to sleep for overall health.    Baseline  Pain as high as 10/10, worst when laying down in the neck, shoulders, r hip, (and migraines) As of 12-22: Pt. describes much less R hip and neck pain as well as less frequent migraines, high of 7/10 vaginally this week but it was short-lived and resolved As of 1/26:    Time  10    Period  Weeks    Status  On-going            Plan - 11/13/19 1430    Clinical Impression  Statement  Pt. Responded well to all interventions today, demonstrating improved hip EXT ROM, decreased spasms and pain in B hips and L shoulder, as well as understanding and correct performance of all education and exercises provided today. They will continue to benefit from skilled physical therapy to work toward remaining goals and maximize function as well as decrease likelihood of symptom increase or recurrence.     PT Next Visit Plan  Review rotator cuff stretches, further explore neck/shoulder and Dn PRN Further MFR around R hip and shoulder/cupping? Review pelvic tilts with pillow, soda-can theory, prolapse with bearing down- symptom recreation?; reassess pelvic alignment; DN v TP release; teach self or partner STM techniques PRN; glute med stability;    PT Home Exercise Plan  R rib shift correction; diaphgramatic breathing; R QL stretch; log rolling and STS edu; side plank; tennis ball TP release; Prone hip-flexor stretch/hold-release; seated and hooklying chin tucks; bridges; given info on internal release and self infernal release materials, pre-squeeze and sneeze, chest stretch, scapular retraction/depression, seated and supine pelvic tilts with/without pillow, rotator cuff program, SCM stretch.    Consulted and Agree with Plan of Care  Patient       Patient will benefit from skilled therapeutic intervention in order to improve the following deficits and impairments:     Visit Diagnosis: Other muscle spasm  Abnormal posture  Other idiopathic scoliosis, thoracolumbar region     Problem List Patient Active Problem List   Diagnosis Date Noted  . Sty, external 05/14/2019  . Plantar fasciitis, bilateral 07/19/2017  . Sebaceous cyst of right axilla 07/19/2017  . Overweight (BMI 25.0-29.9) 04/28/2016  . Migraines 04/04/2016   Cleophus Molt DPT, ATC Cleophus Molt 11/13/2019, 4:09 PM  [facility] MAIN [facility] SERVICES [address]  [place], [place], [address] Phone: [phone]   Fax:  [phone]  Name: YANELI KEITHLEY MRN: [id number] Date of Birth: [birth year]

## 2019-11-13 NOTE — Patient Instructions (Signed)
Flexors, Lunge  Hip Flexor Stretch: Proposal Pose    Maintain pelvic tuck under, lift pubic bone toward navel. Engage posterior hip muscles (firm glute muscles of leg in back position) and shift forward until you feel stretch on front of leg that is down. To increase stretch, maintain balance and ease hips forward. You may use one hand on a chair for balance if needed. Hold for __5__ breaths. Repeat __2-3__ times each leg.  Do _1-2__ times per day.   

## 2019-11-20 ENCOUNTER — Ambulatory Visit: Payer: Medicaid Other

## 2019-11-20 ENCOUNTER — Other Ambulatory Visit: Payer: Self-pay

## 2019-11-20 DIAGNOSIS — R293 Abnormal posture: Secondary | ICD-10-CM

## 2019-11-20 DIAGNOSIS — M62838 Other muscle spasm: Secondary | ICD-10-CM | POA: Diagnosis not present

## 2019-11-20 DIAGNOSIS — M4125 Other idiopathic scoliosis, thoracolumbar region: Secondary | ICD-10-CM

## 2019-11-20 NOTE — Therapy (Addendum)
[facility] MAIN [facility] SERVICES [address] [place], [place], [address] Phone: [phone]   Fax:  [phone]  Physical Therapy Treatment  The patient has been informed of current processes in place at Outpatient Rehab to protect patients from Covid-19 exposure including social distancing, schedule modifications, and new cleaning procedures. After discussing their particular risk with a therapist based on the patient's personal risk factors, the patient has decided to proceed with in-person therapy.   Patient Details  Name: Madison Randall MRN: [id number] Date of Birth: [birth year] No data recorded  Encounter Date: 11/20/2019  PT End of Session - 11/27/19 1520    Visit Number  18    Number of Visits  21    Date for PT Re-Evaluation  10/22/19    Authorization Type  MCAID    Authorization Time Period  08/13/2019 through 01/28/61  (re-cert)    9-5/2-84  [name])    Authorization - Visit Number  7    Authorization - Number of Visits  10    Progress Note Due on Visit  10    PT Start Time  1340    PT Stop Time  1435    PT Time Calculation (min)  55 min    Activity Tolerance  Patient tolerated treatment well;No increased pain    Behavior During Therapy  WFL for tasks assessed/performed       Past Medical History:  Diagnosis Date  . Amenorrhea   . Migraine   . Migraines     Past Surgical History:  Procedure Laterality Date  . DILATION AND CURETTAGE OF UTERUS  09/2014 uterine infection form delivery 2015    There were no vitals filed for this visit.  Pelvic Floor Physical Therapy Treatment Note  SCREENING  Changes in medications, allergies, or medical history?: No    SUBJECTIVE  Patient reports: Whatever we did last week on the Left shoulder helped a lot, has been able to sleep and only needed her lotions twice this week. Pelvis is still good. Her right side has been much tighter over the last week. Her R hip is acting up  again, has pain in the hip-flexor stretch on the lateral hip. Has a feeling of instability still on the RLE sometimes, not dragging it but taking shorter steps when it   Precautions:  6.5 months postpartum   Pain update:  Location of pain: R shoulder (vaginal pressure/discomfort) Current pain:  2/10 (0) Max pain:  8/10 (3) Least pain:  2/10 (0) Nature of pain: "feels like poison"  **R shoulder feels "much better" following treatment.  Location of pain: B hips Current pain:  5/10 Max pain:  7/10  Least pain:  0/10 Nature of pain: burning,tingling  ** "feels more stable" following treatment.  Patient Goals: #1 goal- would to be to feel better in the pelvic area. Scared to be initiate with husband again. Still feels a bulge. Would like to be intimate 3x/week vs 1x/week. Feels like "a leaky faucet" afterword up through the next day.   OBJECTIVE  Changes in: Posture/Observations:  Rounded shoulders, slight FHP.  Range of Motion/Flexibilty:  Decreased shoulder flexion and downward scapular rotation on R>L  Strength/MMT:  LE MMT:   Pelvic floor:  TTP to B posterior fourchette, all PFM on R, other internal on L not assessed today. Anterior vaginal wall visible at the level of the introitus with bearing down. (from 12-22)  Abdominal:    Palpation: TTP to R Pec major  and minor, and subscapularis and R lateral and anterior thigh superficially.  Gait Analysis:   INTERVENTIONS THIS SESSION: Manual:  Performed TP release and STM to R Pec major and minor, and subscapularis to decrease spasm and pain and allow for improved balance of musculature for improved function and decreased symptoms. Performed cupping to R lateral and anterior thigh to improve fascial mobility and allow for improved freedom of motion and glide to decrease pain and restriction.      Total time: 60 minutes                              PT Short Term Goals - 11/27/19 1522       PT SHORT TERM GOAL #1   Title  Patient will demonstrate improved pelvic alignment and balance of musculature surrounding the pelvis to facilitate decreased PFM spasms and decrease pelvic pain.    Baseline  posterior pelvic tilt, R rib shift, R scoliosis, spasms surrounding pelvis. 11-3 decreased R rib shift, decreased posterior pelvic tilt. 12/22: Pt. demonstrates ~80% improvement in pevic alignment and spasms but contiues to have some anterior pelvic tilt and mild R lumbar curve and spasms in the posterior R hip.    Time  5    Period  Weeks    Status  Achieved    Target Date  09/17/19      PT SHORT TERM GOAL #2   Title  Patient will demonstrate HEP x1 in the clinic to demonstrate understanding and proper form to allow for further improvement.    Baseline  Pt. lacks knowledge of therapeutic exercises that can help decrease her Sx. 11-3: pt compliance with HEP    Time  5    Period  Weeks    Status  Achieved    Target Date  07/30/19      PT SHORT TERM GOAL #3   Title  Patient will demonstrate improved sitting and standing posture to demonstrate learning and decrease stress on the pelvic floor with functional activity.    Baseline  posterior pelvic tilt, R rib-shift; 11-3 decreaesd posterior pelvic tilt, improved R rib shift and improved. 12/22: some remaining anterior pelvic tilt, ~ 80% improved    Time  5    Period  Weeks    Status  Achieved    Target Date  09/17/19      PT SHORT TERM GOAL #4   Title  Patient will demonstrate a coordinated contraction, relaxation, and bulge of the pelvic floor muscles to demonstrate functional recruitment and motion and allow for further strengthening.    Baseline  Pt. demonstrates decreased recruitment and coordination of the PFM evidenced by her POP symptoms. 11-3 improved understanding of diphgragmatic breathing in relaxationship to PFM. 12/22: Pt. able to squeeze but continues to have trouble relaxing PFM and decreased strength with squeeze due to  recurrent PFM spasms.    Time  5    Period  Weeks    Status  Achieved    Target Date  09/17/19        PT Long Term Goals - 11/27/19 1522      PT LONG TERM GOAL #1   Title  Patient will describe feeling of pressure no more than 5% of the time over the course of the past week to demonstrate improved recruitment and strength of the pelvic floor.    Baseline  Pt. having increased POP sensation with walking for exercise, having a  BM, sneezing, etc. As of 12/22: pt. reports 6/10 "uncomfortableness" in the pelvic area most of the time As of 1/26: reports average of  3/10 "uncomfortableness" in the pelvic area most of the time.    Time  10    Period  Weeks    Status  Achieved    Target Date  10/22/19      PT LONG TERM GOAL #2   Title  Patient will score at or below 32/300 on the PFDI, 25% on the Female NIH-CPSI and 10/35 on the NDI to demonstrate a clinically meaningful decrease in disability and distress due to pelvic floor dysfunction.    Baseline  PFDI: 77/300, NDI: 19/35, Female NIH-CPSI: 23/43 (53%) As of 08/13/19: PFDI:111/300, Female NIH: 27/43, did not reassess NDI today.    Time  10    Period  --    Status  On-going    Target Date  01/15/20      PT LONG TERM GOAL #3   Title  Patient will report no episodes of SUI over the course of the prior two weeks to demonstrate improved functional ability.    Baseline  Having SUI with cough, sneeze, etc. As of 12/22: is able to decrease SUI when she remembers to do posterior pelvic tilt and kegel before sneeze, less when she does still have leakage. As of 1/26: leakage is mostly after having intercourse, less often with sneeze etc.    Time  10    Period  Weeks    Status  Achieved    Target Date  10/22/19      PT LONG TERM GOAL #4   Title  Patient will describe pain no greater than 1/10 during laying down to demonstrate improved ability to sleep for overall health.    Baseline  Pain as high as 10/10, worst when laying down in the neck,  shoulders, r hip, (and migraines) As of 12-22: Pt. describes much less R hip and neck pain as well as less frequent migraines, high of 7/10 vaginally this week but it was short-lived and resolved As of 4/7: not having pelvic pain, haveng some R hip pain up to 7/10 occasionally though less often and R shoulder pain as high as 8/10 still impeding her ability to sleep well.    Time  10    Period  Weeks    Status  On-going    Target Date  01/15/20            Plan - 11/27/19 1521    Clinical Impression Statement  Pt. is making great progress in her pelvic floor pain and POP symptoms and having less intensity and frequency of hip pain but continues to have ocasional flares of pain int the R hip and B shoulders which are interfering with her ability to sleep. She will benefif from 10 more weeks of pelvic PT to help her fully reach her goals, minimize pain through further postural strengthening and allow for long-term resolution of pain and dysfunction.    Personal Factors and Comorbidities  Comorbidity 3+    Comorbidities  Migraines, Plantar Fasciitis, Scoliosis    Examination-Activity Limitations  Sit;Sleep;Lift;Squat;Bend;Caring for Others;Locomotion Level;Stand;Transfers;Toileting    Examination-Participation Restrictions  Interpersonal Relationship;Community Activity;Shop;Driving;Meal Prep    Stability/Clinical Decision Making  Evolving/Moderate complexity    Clinical Decision Making  Moderate    Rehab Potential  Good    PT Frequency  1x / week    PT Duration  --   10 weeks  PT Treatment/Interventions  ADLs/Self Care Home Management;Biofeedback;Electrical Stimulation;Moist Heat;Traction;Gait training;Functional mobility Network engineer;Therapeutic activities;Therapeutic exercise;Balance training;Neuromuscular re-education;Patient/family education;Manual techniques;Scar mobilization;Passive range of motion;Taping;Dry needling;Visual/perceptual remediation/compensation;Spinal  Manipulations;Joint Manipulations    PT Next Visit Plan  Review rotator cuff stretches, further explore neck/shoulder and Dn PRN Further MFR around R hip and shoulder/cupping? Review pelvic tilts with pillow, soda-can theory, prolapse with bearing down- symptom recreation?; reassess pelvic alignment; DN v TP release; teach self or partner STM techniques PRN; glute med stability;    PT Home Exercise Plan  R rib shift correction; diaphgramatic breathing; R QL stretch; log rolling and STS edu; side plank; tennis ball TP release; Prone hip-flexor stretch/hold-release; seated and hooklying chin tucks; bridges; given info on internal release and self infernal release materials, pre-squeeze and sneeze, chest stretch, scapular retraction/depression, seated and supine pelvic tilts with/without pillow, rotator cuff program, SCM stretch.    Consulted and Agree with Plan of Care  Patient       Patient will benefit from skilled therapeutic intervention in order to improve the following deficits and impairments:  Decreased balance, Decreased endurance, Difficulty walking, Increased muscle spasms, Improper body mechanics, Decreased activity tolerance, Decreased coordination, Decreased strength, Increased fascial restricitons, Postural dysfunction, Pain  Visit Diagnosis: Other muscle spasm  Abnormal posture  Other idiopathic scoliosis, thoracolumbar region     Problem List Patient Active Problem List   Diagnosis Date Noted  . Sty, external 05/14/2019  . Plantar fasciitis, bilateral 07/19/2017  . Sebaceous cyst of right axilla 07/19/2017  . Overweight (BMI 25.0-29.9) 04/28/2016  . Migraines 04/04/2016   Cleophus Molt DPT, ATC Cleophus Molt 11/27/2019, 3:31 PM  [facility] MAIN [facility] SERVICES [address] [place], [place], [address] Phone: [phone]   Fax:  [phone]  Name: DALLYS NOWAKOWSKI MRN: [id number] Date of Birth: 04-26-1986

## 2019-11-27 ENCOUNTER — Ambulatory Visit: Payer: Medicaid Other

## 2019-11-27 NOTE — Addendum Note (Signed)
Addended by: Flora Lipps T on: 11/27/2019 03:35 PM   Modules accepted: Orders

## 2019-12-12 ENCOUNTER — Other Ambulatory Visit: Payer: Self-pay

## 2019-12-12 ENCOUNTER — Ambulatory Visit: Payer: Medicaid Other | Attending: Obstetrics and Gynecology

## 2019-12-12 DIAGNOSIS — R293 Abnormal posture: Secondary | ICD-10-CM | POA: Diagnosis present

## 2019-12-12 DIAGNOSIS — M62838 Other muscle spasm: Secondary | ICD-10-CM | POA: Diagnosis present

## 2019-12-12 DIAGNOSIS — M4125 Other idiopathic scoliosis, thoracolumbar region: Secondary | ICD-10-CM | POA: Insufficient documentation

## 2019-12-12 NOTE — Therapy (Addendum)
[facility] MAIN [facility] SERVICES [address] [place], [place], [address] Phone: [phone]   Fax:  [phone]  Physical Therapy Treatment  The patient has been informed of current processes in place at Outpatient Rehab to protect patients from Covid-19 exposure including social distancing, schedule modifications, and new cleaning procedures. After discussing their particular risk with a therapist based on the patient's personal risk factors, the patient has decided to proceed with in-person therapy.   Patient Details  Name: Madison Randall MRN: [id number] Date of Birth: [birth year] No data recorded  Encounter Date: 12/12/2019  PT End of Session - 12/12/19 1658    Visit Number  19    Number of Visits  29    Date for PT Re-Evaluation  01/15/20    Authorization Type  MCAID    Authorization Time Period  12/09/19 through 02/16/20    Authorization - Visit Number  7    Authorization - Number of Visits  10    Progress Note Due on Visit  10    PT Start Time  1535    PT Stop Time  1635    PT Time Calculation (min)  60 min    Activity Tolerance  Patient tolerated treatment well;No increased pain    Behavior During Therapy  WFL for tasks assessed/performed       Past Medical History:  Diagnosis Date  . Amenorrhea   . Migraine   . Migraines     Past Surgical History:  Procedure Laterality Date  . DILATION AND CURETTAGE OF UTERUS  09/2014 uterine infection form delivery 2015    There were no vitals filed for this visit.  Pelvic Floor Physical Therapy Treatment Note  SCREENING  Changes in medications, allergies, or medical history?: No    SUBJECTIVE  Patient reports: She had a terrible sinusitis episode and migraine. Pelvic area is doing better. Had had some pain here and there in the neck/shoulders not too back, even used baby carrier. Hip is still the same/not happy. Does not feel confident in the L hip. It got irritated by a short  "hike", burning/tingling/numb in the leg. Her hip felt good initially and then when she got to the parking lot it "snapped" and felt bad for ~ 48 hours and has not been great since.  Precautions:  6.5 months postpartum   Pain update:  Location of pain: R shoulder (vaginal pressure/discomfort) Current pain:  2/10 (0) Max pain:  8/10 (3) Least pain:  2/10 (0) Nature of pain: "feels like poison"  **R shoulder feels "much better" following treatment.  Location of pain: B hips Current pain:  5/10 Max pain:  7/10  Least pain:  0/10 Nature of pain: burning,tingling  ** "feels more stable" following treatment.  Patient Goals: #1 goal- would to be to feel better in the pelvic area. Scared to be initiate with husband again. Still feels a bulge. Would like to be intimate 3x/week vs 1x/week. Feels like "a leaky faucet" afterword up through the next day.   OBJECTIVE  Changes in: Posture/Observations:  Pt. Using a lot of ankle strategy and feels unsteady balancing on RLE for 5 sec. Pre-treatment.   -Following treatment, ~ 90% improved ankle stability in R SLS and decreased fear.  Range of Motion/Flexibilty:  Decreased shoulder flexion and downward scapular rotation on R>L  Strength/MMT:  LE MMT:   Pelvic floor:  TTP to B posterior fourchette, all PFM on R, other internal on L not assessed today.  Anterior vaginal wall visible at the level of the introitus with bearing down. (from 12-22)  Abdominal:    Palpation: TTP to R Pectineus, Iliacus  Gait Analysis:   INTERVENTIONS THIS SESSION: Manual:  Performed TP release to R pectineus and STM to R iliacus to decrease spasm and pain and allow for improved balance of musculature for improved function and decreased symptoms.  Dry-needle: Performed TPDN with a .30x42mm needle and standard approach as described below to decrease spasm and pain and allow for improved balance of musculature for improved function and decreased  symptoms.  Therex: Educated on and practiced SLS to improve R hip stability and balance. Reviewed hip-flexor stretch and adductor stretches To maintain and improve muscle length and allow for improved balance of musculature for long-term symptom relief.  E-stim: Performed E-stim with TPDN to allow for improved muscle relaxation at R hip-flexors and improve tolerance to treatment.    Total time: 60 minutes                       Trigger Point Dry Needling - 12/12/19 0001    Consent Given?  Yes    Education Handout Provided  No    Muscles Treated Back/Hip  Iliacus    Dry Needling Comments  right    Iliacus Response  Twitch response elicited;Palpable increased muscle length             PT Short Term Goals - 11/27/19 1522      PT SHORT TERM GOAL #1   Title  Patient will demonstrate improved pelvic alignment and balance of musculature surrounding the pelvis to facilitate decreased PFM spasms and decrease pelvic pain.    Baseline  posterior pelvic tilt, R rib shift, R scoliosis, spasms surrounding pelvis. 11-3 decreased R rib shift, decreased posterior pelvic tilt. 12/22: Pt. demonstrates ~80% improvement in pevic alignment and spasms but contiues to have some anterior pelvic tilt and mild R lumbar curve and spasms in the posterior R hip.    Time  5    Period  Weeks    Status  Achieved    Target Date  09/17/19      PT SHORT TERM GOAL #2   Title  Patient will demonstrate HEP x1 in the clinic to demonstrate understanding and proper form to allow for further improvement.    Baseline  Pt. lacks knowledge of therapeutic exercises that can help decrease her Sx. 11-3: pt compliance with HEP    Time  5    Period  Weeks    Status  Achieved    Target Date  07/30/19      PT SHORT TERM GOAL #3   Title  Patient will demonstrate improved sitting and standing posture to demonstrate learning and decrease stress on the pelvic floor with functional activity.    Baseline   posterior pelvic tilt, R rib-shift; 11-3 decreaesd posterior pelvic tilt, improved R rib shift and improved. 12/22: some remaining anterior pelvic tilt, ~ 80% improved    Time  5    Period  Weeks    Status  Achieved    Target Date  09/17/19      PT SHORT TERM GOAL #4   Title  Patient will demonstrate a coordinated contraction, relaxation, and bulge of the pelvic floor muscles to demonstrate functional recruitment and motion and allow for further strengthening.    Baseline  Pt. demonstrates decreased recruitment and coordination of the PFM evidenced by her POP symptoms. 11-3 improved  understanding of diphgragmatic breathing in relaxationship to PFM. 12/22: Pt. able to squeeze but continues to have trouble relaxing PFM and decreased strength with squeeze due to recurrent PFM spasms.    Time  5    Period  Weeks    Status  Achieved    Target Date  09/17/19        PT Long Term Goals - 11/27/19 1522      PT LONG TERM GOAL #1   Title  Patient will describe feeling of pressure no more than 5% of the time over the course of the past week to demonstrate improved recruitment and strength of the pelvic floor.    Baseline  Pt. having increased POP sensation with walking for exercise, having a BM, sneezing, etc. As of 12/22: pt. reports 6/10 "uncomfortableness" in the pelvic area most of the time As of 1/26: reports average of  3/10 "uncomfortableness" in the pelvic area most of the time.    Time  10    Period  Weeks    Status  Achieved    Target Date  10/22/19      PT LONG TERM GOAL #2   Title  Patient will score at or below 32/300 on the PFDI, 25% on the Female NIH-CPSI and 10/35 on the NDI to demonstrate a clinically meaningful decrease in disability and distress due to pelvic floor dysfunction.    Baseline  PFDI: 77/300, NDI: 19/35, Female NIH-CPSI: 23/43 (53%) As of 08/13/19: PFDI:111/300, Female NIH: 27/43, did not reassess NDI today.    Time  10    Period  --    Status  On-going    Target  Date  01/15/20      PT LONG TERM GOAL #3   Title  Patient will report no episodes of SUI over the course of the prior two weeks to demonstrate improved functional ability.    Baseline  Having SUI with cough, sneeze, etc. As of 12/22: is able to decrease SUI when she remembers to do posterior pelvic tilt and kegel before sneeze, less when she does still have leakage. As of 1/26: leakage is mostly after having intercourse, less often with sneeze etc.    Time  10    Period  Weeks    Status  Achieved    Target Date  10/22/19      PT LONG TERM GOAL #4   Title  Patient will describe pain no greater than 1/10 during laying down to demonstrate improved ability to sleep for overall health.    Baseline  Pain as high as 10/10, worst when laying down in the neck, shoulders, r hip, (and migraines) As of 12-22: Pt. describes much less R hip and neck pain as well as less frequent migraines, high of 7/10 vaginally this week but it was short-lived and resolved As of 4/7: not having pelvic pain, haveng some R hip pain up to 7/10 occasionally though less often and R shoulder pain as high as 8/10 still impeding her ability to sleep well.    Time  10    Period  Weeks    Status  On-going    Target Date  01/15/20            Plan - 12/12/19 1701    Clinical Impression Statement  Pt. Responded well to all interventions today, demonstrating decreased spasm, TTP, and improved RLE stability/balance as well as understanding and correct performance of all education and exercises provided today. They will continue to  benefit from skilled physical therapy to work toward remaining goals and maximize function as well as decrease likelihood of symptom increase or recurrence.     PT Next Visit Plan  re-assess hip strength , especially R glute re-assess R hip-flexors and pectineus. Review rotator cuff stretches, further explore neck/shoulder and Dn PRN Further MFR around R hip and shoulder/cupping? Review pelvic tilts with  pillow, soda-can theory, prolapse with bearing down- symptom recreation?; reassess pelvic alignment; DN v TP release; teach self or partner STM techniques PRN; glute med stability;    PT Home Exercise Plan  R rib shift correction; diaphgramatic breathing; R QL stretch; log rolling and STS edu; side plank; tennis ball TP release; Prone hip-flexor stretch/hold-release; seated and hooklying chin tucks; bridges; given info on internal release and self infernal release materials, pre-squeeze and sneeze, chest stretch, scapular retraction/depression, seated and supine pelvic tilts with/without pillow, rotator cuff program, SCM stretch. SLS balance    Consulted and Agree with Plan of Care  Patient       Patient will benefit from skilled therapeutic intervention in order to improve the following deficits and impairments:     Visit Diagnosis: Other muscle spasm  Abnormal posture  Other idiopathic scoliosis, thoracolumbar region     Problem List Patient Active Problem List   Diagnosis Date Noted  . Sty, external 05/14/2019  . Plantar fasciitis, bilateral 07/19/2017  . Sebaceous cyst of right axilla 07/19/2017  . Overweight (BMI 25.0-29.9) 04/28/2016  . Migraines 04/04/2016   Cleophus Molt DPT, ATC Cleophus Molt 12/12/2019, 5:18 PM  [facility] MAIN [facility] SERVICES [address] [place], [place], [address] Phone: [phone]   Fax:  [phone]  Name: LEYDY WORTHEY MRN: [id number] Date of Birth: 1985-11-16

## 2019-12-12 NOTE — Patient Instructions (Signed)
Balance: Eyes Open - Unilateral (Varied Surfaces)    Stand on left foot, eyes open. Maintain balance __10__ seconds. Repeat __5__ times per set. Do __1__ sets per session. Do _7___ sessions per week. Repeat on compliant surface: foam.  Copyright  VHI. All rights reserved.

## 2019-12-17 ENCOUNTER — Ambulatory Visit: Payer: Medicaid Other

## 2019-12-24 ENCOUNTER — Ambulatory Visit: Payer: Medicaid Other

## 2020-01-01 ENCOUNTER — Ambulatory Visit: Payer: Medicaid Other | Attending: Obstetrics and Gynecology

## 2020-01-07 ENCOUNTER — Ambulatory Visit: Payer: Medicaid Other

## 2020-01-14 ENCOUNTER — Ambulatory Visit: Payer: Medicaid Other

## 2020-08-19 ENCOUNTER — Encounter: Payer: Self-pay | Admitting: Obstetrics and Gynecology

## 2020-08-19 ENCOUNTER — Other Ambulatory Visit: Payer: Self-pay

## 2020-08-19 ENCOUNTER — Ambulatory Visit (INDEPENDENT_AMBULATORY_CARE_PROVIDER_SITE_OTHER): Payer: Medicaid Other | Admitting: Obstetrics and Gynecology

## 2020-08-19 VITALS — BP 133/85 | HR 85 | Ht 63.0 in | Wt 187.4 lb

## 2020-08-19 DIAGNOSIS — E669 Obesity, unspecified: Secondary | ICD-10-CM | POA: Diagnosis not present

## 2020-08-19 DIAGNOSIS — N8110 Cystocele, unspecified: Secondary | ICD-10-CM

## 2020-08-19 DIAGNOSIS — M6289 Other specified disorders of muscle: Secondary | ICD-10-CM | POA: Diagnosis not present

## 2020-08-19 NOTE — Progress Notes (Signed)
    GYNECOLOGY PROGRESS NOTE  Subjective:    Patient ID: Madison Randall, female    DOB: 23-Dec-1985, 34 y.o.   MRN: [id number]  HPI  Patient is a 34 y.o. [id number] female who presents for complaints of pelvic floor dysfunction. She reports that after the birth of her last child last year, she had significant pelvic floor dysfunction requiring physical therapy (was noting more pelvic pressure and feeling like something was pushing down in her vagina, difficulty holding her bladder, painful intercourse). States that after 7 months of therapy she was noting pretty significant improvement.  Most recently approximately 1 month ago, she began to note that her symptoms began to return after doing some lifting (has been moving into a new place).  Is feeling "un-aligned" and sex is starting to become painful again.  She has been trying to do some of her pelvic floor exercises again which has helped some.   Additionally she complains of difficulty losing weight.  Notes no issues after her previous children with weight loss, but after this last pregnancy, has not been able to lose the weight as effectively.  She has tried dieting, but has only lost 6 lbs in the past few months and notes it was very restrictive and difficult to maintain and was becoming very irritable.     The following portions of the patient's history were reviewed and updated as appropriate: allergies, current medications, past family history, past medical history, past social history, past surgical history and problem list.  Review of Systems Pertinent items noted in HPI and remainder of comprehensive ROS otherwise negative.   Objective:   Blood pressure 133/85, pulse 85, height 5\' 3"  (1.6 m), weight 187 lb 6.4 oz (85 kg), last menstrual period 08/05/2020, currently breastfeeding. Body mass index is 33.2 kg/m. General appearance: alert and no distress Abdomen: soft, non-tender; bowel sounds normal; no masses,  no organomegaly Pelvic:  external genitalia normal, rectovaginal septum normal.  Vagina without discharge. Mild cystocele (Grade 1). Tight band of tissue along left vaginal sidewall, with associated tenderness. Cervix normal appearing, no lesions and no motion tenderness.  Uterus mobile, nontender, normal shape and size.  Adnexae non-palpable, nontender bilaterally.  Extremities: extremities normal, atraumatic, no cyanosis or edema Neurologic: Grossly normal   Assessment:   1. Pelvic floor dysfunction   2. Female cystocele   3. Mild obesity     Plan:   1. Pelvic floor dysfunction, also with small cystocele - Patient encouraged to resume pelvic floor physical therapy. Also encouraged to consider use of a chiropractor. Cystocele mild, will likely improve with physical therapy.  2. Mild obesity  - Discussed alternative diet types, including keto, mediterranean, or similar high protein/low carb diet. Advised on smaller more frequent meals. Also encouraged exercise (as patient notes she has not attempted due to her pelvic and hip discomfort). This will likely be improved with physical therapy. Also discussed OTC weight loss supplements (Belviq, Alli, apple cider vinegar/Goli).  If OTC options do not work, can discuss prescription medications.   RTC in 3 months for annual exam. Will reassess pelvic symptoms and weight loss efforts then.    A total of 25 minutes were spent face-to-face with the patient during this encounter and over half of that time involved counseling and coordination of care.   08/07/2020, MD [facility]

## 2020-08-19 NOTE — Progress Notes (Signed)
Pt present for a follow up due to having pelvic floor issues and weight gain.

## 2020-08-21 ENCOUNTER — Encounter: Payer: Self-pay | Admitting: Obstetrics and Gynecology

## 2020-08-21 DIAGNOSIS — M6289 Other specified disorders of muscle: Secondary | ICD-10-CM | POA: Insufficient documentation

## 2020-08-21 DIAGNOSIS — N8110 Cystocele, unspecified: Secondary | ICD-10-CM | POA: Insufficient documentation

## 2020-08-21 NOTE — Patient Instructions (Signed)
The Female Pelvic Floor  The pelvic floor consists of several layers of muscles that cover the bottom of the pelvic cavity. These muscles have several distinct roles:  1. To support the pelvic organs, the bladder, uterus and colon within the pelvis. 2. To assist in stopping and starting the flow of urine or the passage of gas or stool.To aid in sexual appreciation.  How to Locate the Pelvic Floor Muscles  The Urine Stop Test . At the midstream of your urine flow, squeeze the pelvic floor muscles. You should feel the sensation of the openings close and the muscles pulling up and into the pelvic cavity.  If you have strong muscles you will slow or stop the stream of urine. . Stop or slow the flow of urine without tensing the muscles of your legs, buttocks. . Do this only to locate the muscles, not as a daily exercise. Feeling the Muscle . Place a fingertip on the anal opening. Contract and lift the muscles as though you are holding back gas or stool. You will feel your anal opening tighten. . Insert 1 or 2 fingers into the vagina to feel the contraction and lifting of the muscles. You should feel the opening of the vagina tighten around your finger. Watching the Muscle Contract . Begin by lying on a flat surface.  Position yourself with your knees apart and bent with your head elevated and supported on several pillows. Use a mirror to look at the anal and vaginal openings and the perineal body (the area between the two openings).  . Contract or tighten the muscles around the openings and watch for a lifting of the perineal body and closure of the openings.   . If you see a bulge or feel tissues coming out of your openings, this is an incorrect contraction and you should notify your health care provider for more instructions.  2007, [employer] Doc.11  

## 2020-09-15 ENCOUNTER — Ambulatory Visit: Payer: Medicaid Other | Attending: Obstetrics and Gynecology

## 2020-09-15 ENCOUNTER — Other Ambulatory Visit: Payer: Self-pay

## 2020-09-15 DIAGNOSIS — R293 Abnormal posture: Secondary | ICD-10-CM | POA: Insufficient documentation

## 2020-09-15 DIAGNOSIS — M4125 Other idiopathic scoliosis, thoracolumbar region: Secondary | ICD-10-CM | POA: Diagnosis present

## 2020-09-15 DIAGNOSIS — M62838 Other muscle spasm: Secondary | ICD-10-CM | POA: Insufficient documentation

## 2020-09-15 NOTE — Therapy (Incomplete)
[facility] MAIN [facility] SERVICES [address] [place], [place], [address] Phone: [phone]   Fax:  [phone]  Physical Therapy Evaluation  The patient has been informed of current processes in place at [facility] to protect patients from Covid-19 exposure including social distancing, schedule modifications, and new cleaning procedures. After discussing their particular risk with a therapist based on the patient's personal risk factors, the patient has decided to proceed with in-person therapy.  Patient Details  Name: Madison Randall MRN: [id number] Date of Birth: [birth year] No data recorded  Encounter Date: 09/15/2020   PT End of Session - 09/15/20 0859    Visit Number 1    Number of Visits 10    Date for PT Re-Evaluation 01/15/20    Authorization Type MCAID    Authorization Time Period 09/15/20 through 11/24/20    Authorization - Visit Number 1    Authorization - Number of Visits 10    Progress Note Due on Visit 10    PT Start Time 0830    PT Stop Time 0930    PT Time Calculation (min) 60 min    Activity Tolerance Patient tolerated treatment well;No increased pain    Behavior During Therapy WFL for tasks assessed/performed           Past Medical History:  Diagnosis Date  . Amenorrhea   . Migraine   . Migraines     Past Surgical History:  Procedure Laterality Date  . DILATION AND CURETTAGE OF UTERUS  09/2014 uterine infection form delivery 2015    There were no vitals filed for this visit.  Pelvic Floor Physical Therapy Evaluation and Assessment  SCREENING  Falls in last 6 mo: Yes, 4 months ago (about 6 months and 8 months gestation). Patient reports her right leg gave out and she fell twice. Walked with a walker for most of her pregnancy.   Interpreter Needed?: No Interpreter Agency:  Interpreter Name  Interpreter ID  Patient Declined Interpreter   Patient signed [facility] waiver    Patient's communication  preference:   Red Flags:  Have you had any night sweats? Yes- since baby 3 was born.  Unexplained weight loss? No  Saddle anesthesia? No  Unexplained changes in bowel or bladder habits? No   SUBJECTIVE  Patient reports: She was doing well following discharge for quite a while but over-did it one day re-arranging the kids' room and since then has been having pain with intercourse and feeling of pelvic heaviness again. She has been doing her HEP and it is helping her back and hip pain is still a lot better but she is nervous about even blowing her nose again. SUI has not been bad but even sweeping and vacuuming can increase her symptoms significantly. She is fearful again, though not as bad as it had been. She has had times where her shoulders have flared up but she gets out "her papers" and does her exercises and it gets better. 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    Recent Procedures/Tests/Findings:  Grade 1 POP  Obstetrical History: [id number]  2-3 stitches per delivery.   Gynecological History: none  Urinary History: - blowing nose- no urinary leakage but increased pressure "like is something is falling out"   Gastrointestinal History: "good pooper" no straining but  does feel increased POP pressure   Sexual activity/pain: Having pain with intercourse.  Location of pain: R hip  Current pain:  0/10  Max pain:  10/10 with activity and laying down Least pain:  0/10 Nature of pain: deep ache    Patient Goals: Get back to living life without fear of activity and POP pressure, decrease pain with intercourse and in R hip.   OBJECTIVE  Posture/Observations:  Sitting: resting feet on toes. Standing: L ASIS high, L PSIS high   Palpation/Segmental Motion/Joint Play: TTP to R>L hip-flexors and L >R QL. TTP to  L>R lumbar multifidus and B Piriformis, OI and glutes. L>R diaphragm and adductors and L rectus abdominus insertion.   Special tests:   Scoliosis: mild R thoracic Supine-to-long-sit: R anterior rotation and L up-slip (ankles even in both but R ASIS anterior   Range of Motion/Flexibilty:  Spine: 6 in. From floor wit forward bend R SB ~ 1 finger from knee with pain in R SIJ L SB ~ 2 fingers from knee. Hips:   Strength/MMT:  LE MMT  LE MMT Left Right  Hip flex:  (L2) /5 /5  Hip ext: 4/5 4+/5  Hip abd: 4/5 5/5  Hip add: 4/5 4+/5  Hip IR 4+/5 4/5  Hip ER 4+/5 4+/5       Abdominal:  Palpation: B Deep Hip Flexors, R>L  Diastasis: not re-assessed, not noted during prior episode.   Pelvic Floor External Exam: Deferred to follow-up Introitus Appears:  Skin integrity:  Palpation: Cough: Prolapse visible?: Scar mobility:  Internal Vaginal Exam: Deferred to Follow up  Strength (PERF):  Symmetry: Palpation: Prolapse:   Internal Rectal Exam: Deferred to follow up  Strength (PERF): Symmetry: Palpation: Prolapse:   Gait Analysis: Deferred to follow up    Pelvic Floor Outcome Measures: FOTO PFDI Prolapse: 58, PFDI Pain:58  INTERVENTIONS THIS SESSION: Therex: educated on and practiced posterior pelvic tilts with emphasis on TA, glute, and obliques recruitment in order. Reviewed side-stretch and hip-flexor stretches.  Self-care: discussed exam findings and POC, educated on where to purchase a tool for self TP release at home.   Total time: 60 minutes                      Objective measurements completed on examination: See above findings.                 PT Short Term Goals - 11/27/19 1522      PT SHORT TERM GOAL #1   Title Patient will demonstrate improved pelvic alignment and balance of musculature surrounding the pelvis to facilitate decreased PFM spasms and decrease pelvic pain.    Baseline posterior pelvic tilt, R rib shift, R  scoliosis, spasms surrounding pelvis. 11-3 decreased R rib shift, decreased posterior pelvic tilt. 12/22: Pt. demonstrates ~80% improvement in pevic alignment and spasms but contiues to have some anterior pelvic tilt and mild R lumbar curve and spasms in the posterior R hip.    Time 5    Period Weeks    Status Achieved    Target Date 09/17/19      PT SHORT TERM GOAL #2   Title Patient will demonstrate HEP x1 in the clinic to demonstrate understanding and proper form to allow for further improvement.    Baseline Pt. lacks knowledge of therapeutic exercises that can help decrease her Sx. 11-3: pt compliance with HEP    Time 5    Period Weeks    Status  Achieved    Target Date 07/30/19      PT SHORT TERM GOAL #3   Title Patient will demonstrate improved sitting and standing posture to demonstrate learning and decrease stress on the pelvic floor with functional activity.    Baseline posterior pelvic tilt, R rib-shift; 11-3 decreaesd posterior pelvic tilt, improved R rib shift and improved. 12/22: some remaining anterior pelvic tilt, ~ 80% improved    Time 5    Period Weeks    Status Achieved    Target Date 09/17/19      PT SHORT TERM GOAL #4   Title Patient will demonstrate a coordinated contraction, relaxation, and bulge of the pelvic floor muscles to demonstrate functional recruitment and motion and allow for further strengthening.    Baseline Pt. demonstrates decreased recruitment and coordination of the PFM evidenced by her POP symptoms. 11-3 improved understanding of diphgragmatic breathing in relaxationship to PFM. 12/22: Pt. able to squeeze but continues to have trouble relaxing PFM and decreased strength with squeeze due to recurrent PFM spasms.    Time 5    Period Weeks    Status Achieved    Target Date 09/17/19             PT Long Term Goals - 11/27/19 1522      PT LONG TERM GOAL #1   Title Patient will describe feeling of pressure no more than 5% of the time over the  course of the past week to demonstrate improved recruitment and strength of the pelvic floor.    Baseline Pt. having increased POP sensation with walking for exercise, having a BM, sneezing, etc. As of 12/22: pt. reports 6/10 "uncomfortableness" in the pelvic area most of the time As of 1/26: reports average of  3/10 "uncomfortableness" in the pelvic area most of the time.    Time 10    Period Weeks    Status Achieved    Target Date 10/22/19      PT LONG TERM GOAL #2   Title Patient will score at or below 32/300 on the PFDI, 25% on the Female NIH-CPSI and 10/35 on the NDI to demonstrate a clinically meaningful decrease in disability and distress due to pelvic floor dysfunction.    Baseline PFDI: 77/300, NDI: 19/35, Female NIH-CPSI: 23/43 (53%) As of 08/13/19: PFDI:111/300, Female NIH: 27/43, did not reassess NDI today.    Time 10    Period --    Status On-going    Target Date 01/15/20      PT LONG TERM GOAL #3   Title Patient will report no episodes of SUI over the course of the prior two weeks to demonstrate improved functional ability.    Baseline Having SUI with cough, sneeze, etc. As of 12/22: is able to decrease SUI when she remembers to do posterior pelvic tilt and kegel before sneeze, less when she does still have leakage. As of 1/26: leakage is mostly after having intercourse, less often with sneeze etc.    Time 10    Period Weeks    Status Achieved    Target Date 10/22/19      PT LONG TERM GOAL #4   Title Patient will describe pain no greater than 1/10 during laying down to demonstrate improved ability to sleep for overall health.    Baseline Pain as high as 10/10, worst when laying down in the neck, shoulders, r hip, (and migraines) As of 12-22: Pt. describes much less R hip and neck pain as well  as less frequent migraines, high of 7/10 vaginally this week but it was short-lived and resolved As of 4/7: not having pelvic pain, haveng some R hip pain up to 7/10 occasionally though  less often and R shoulder pain as high as 8/10 still impeding her ability to sleep well.    Time 10    Period Weeks    Status On-going    Target Date 01/15/20                  Plan - 09/15/20 1512    Clinical Impression Statement Pt. reports today with cheif c/o POP Sx. including a sensation of bulge and mild SUI as well as dyspareunia. Her PMH is significant for 5 vaginal deliveries, mild scoliosis, and chronic R hip pain. She has been successfully treated for this concern during prior PT episode but has recurrent symptoms following over-exertion with moving and organizing furnature within the home. Her clinical assessment revealed a L up-slip and R anterior rotation as well as spasms surrounding the pelvis and hip weakness through L>R. She will benefit from skilled pelvic health PT to address the noted deficits and to continue to assess for and address any other potential causes of Sx.    Personal Factors and Comorbidities Comorbidity 3+    Comorbidities Migraines, Plantar Fasciitis, Scoliosis    Examination-Activity Limitations Sit;Sleep;Lift;Squat;Bend;Caring for Others;Locomotion Level;Stand;Transfers;Toileting    Examination-Participation Restrictions Interpersonal Relationship;Community Activity;Shop;Driving;Meal Prep    Stability/Clinical Decision Making Evolving/Moderate complexity    Clinical Decision Making Moderate    Rehab Potential Good    PT Frequency 1x / week    PT Duration Other (comment)   10 weeks   PT Treatment/Interventions ADLs/Self Care Home Management;Biofeedback;Electrical Stimulation;Moist Heat;Traction;Gait training;Functional mobility Network engineer;Therapeutic activities;Therapeutic exercise;Balance training;Neuromuscular re-education;Patient/family education;Manual techniques;Scar mobilization;Passive range of motion;Taping;Dry needling;Visual/perceptual remediation/compensation;Spinal Manipulations;Joint Manipulations    PT Next Visit Plan address  L up-slip and R anterior rotation, review deep-core and add glute max/med in standing, 3-way wall stretches,assess PFM when appropriate and teach self internal TP release.    PT Home Exercise Plan R rib shift correction; diaphgramatic breathing; R QL stretch; log rolling and STS edu; side plank; tennis ball TP release; Prone hip-flexor stretch/hold-release; seated and hooklying chin tucks; bridges; given info on internal release and self infernal release materials, pre-squeeze and sneeze, chest stretch, scapular retraction/depression, seated and supine pelvic tilts with/without pillow, rotator cuff program, SCM stretch. SLS balance. NEW: pelvic tilts in supine, hip-flexor stretch, side-stretch.    Consulted and Agree with Plan of Care Patient           Patient will benefit from skilled therapeutic intervention in order to improve the following deficits and impairments:  Decreased balance,Decreased endurance,Difficulty walking,Increased muscle spasms,Improper body mechanics,Decreased activity tolerance,Decreased coordination,Decreased strength,Increased fascial restricitons,Postural dysfunction,Pain  Visit Diagnosis: Other muscle spasm  Abnormal posture  Other idiopathic scoliosis, thoracolumbar region     Problem List Patient Active Problem List   Diagnosis Date Noted  . Female cystocele 08/21/2020  . Pelvic floor dysfunction 08/21/2020  . Sty, external 05/14/2019  . Plantar fasciitis, bilateral 07/19/2017  . Sebaceous cyst of right axilla 07/19/2017  . Overweight (BMI 25.0-29.9) 04/28/2016  . Migraines 04/04/2016   Cleophus Molt DPT, ATC Cleophus Molt 09/15/2020, 3:27 PM  [facility] MAIN [facility] SERVICES [address] [place], [place], [address] Phone: [phone]   Fax:  [phone]  Name: SAPHIA VANDERFORD MRN: [id number] Date of Birth: [birth year]

## 2020-09-15 NOTE — Patient Instructions (Signed)
Intimate Rose internal trigger point release tool [phone]  Dr. Alonna Minium Premium Prostate Massager   [phone]    Hold for 30 seconds (5 deep breaths) and repeat 2-3 times on each side once a day (left arm over more than right)   Flexors, Lunge  Hip Flexor Stretch: Proposal Pose  (right more than left)  Maintain pelvic tuck under, lift pubic bone toward navel. Engage posterior hip muscles (firm glute muscles of leg in back position) and shift forward until you feel stretch on front of leg that is down. To increase stretch, maintain balance and ease hips forward. You may use one hand on a chair for balance if needed. Hold for __5__ breaths. Repeat __2-3__ times each leg.  Do _1-2__ times per day.  Pelvic Tilt With Pelvic Floor (Hook-Lying)        Lie with hips and knees bent. Exhale and feel the low tummy muscles tighten first, followed by a little squeeze from the glutes to flatten the low back, keep exhaling until you feel the ribs draw together last as you push the last bit of breath out. Repeat _2x15__ times. Do __1-2_ times a day.  Put a pillow under your hips in this position when doing this exercise to help decrease pressure in the pelvis when it feels "heavy".

## 2020-09-15 NOTE — Therapy (Incomplete)
[facility] MAIN [facility] SERVICES [address] [place], [place], [address] Phone: [phone]   Fax:  [phone]  Physical Therapy Evaluation  The patient has been informed of current processes in place at Outpatient Rehab to protect patients from Covid-19 exposure including social distancing, schedule modifications, and new cleaning procedures. After discussing their particular risk with a therapist based on the patient's personal risk factors, the patient has decided to proceed with in-person therapy.  Patient Details  Name: RILYNN HABEL MRN: [id number] Date of Birth: November 14, 1985 No data recorded  Encounter Date: 09/15/2020    Past Medical History:  Diagnosis Date  . Amenorrhea   . Migraine   . Migraines     Past Surgical History:  Procedure Laterality Date  . DILATION AND CURETTAGE OF UTERUS  09/2014 uterine infection form delivery 2015    There were no vitals filed for this visit.  Pelvic Floor Physical Therapy Evaluation and Assessment  SCREENING  Falls in last 6 mo: Yes, 4 months ago (about 6 months and 8 months gestation). Patient reports her right leg gave out and she fell twice. Walked with a walker for most of her pregnancy.   Interpreter Needed?: No Interpreter Agency:  Interpreter Name  Interpreter ID  Patient Declined Interpreter   Patient signed [facility] waiver    Patient's communication preference:   Red Flags:  Have you had any night sweats? Yes- since baby 3 was born.  Unexplained weight loss? No  Saddle anesthesia? No  Unexplained changes in bowel or bladder habits? No   SUBJECTIVE  Patient reports: She was doing well following discharge for quite a while but over-did it one day re-arranging the kids' room and since then has been having pain with intercourse and feeling of pelvic heaviness again. She has been doing her HEP and it is helping her back and hip pain is still a lot better but she is nervous  about even blowing her nose again. SUI has not been bad but even sweeping and vacuuming can increase her symptoms significantly. She is fearful again, though not as bad as it had been. She has had times where her shoulders have flared up but she gets out "her papers" and does her exercises and it gets better. 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    Recent Procedures/Tests/Findings:  Grade 1 POP  Obstetrical History: [id number]  2-3 stitches per delivery.   Gynecological History: none  Urinary History: - blowing nose- no urinary leakage but increased pressure "like is something is falling out"   Gastrointestinal History: "good pooper" no straining but does feel increased POP pressure   Sexual activity/pain: Having pain with intercourse.  Location of pain: R hip  Current pain:  0/10  Max pain:  10/10 with activity and laying down Least pain:  0/10 Nature of pain: deep ache  -- still present during sitting(numbness/nerving), walking, standing, stairs, sleeping.  --off load the hip, heating pad, deep blue rub oil, Frankenstein (enjoys essential oil), hot baths.    Patient Goals:    OBJECTIVE  Posture/Observations:  Sitting: resting feet on toes. Standing: L ASIS high   Palpation/Segmental Motion/Joint Play: TTP to R>L hip-flexors and L >R QL. TTP to L>R lumbar multifidus and B Piriformis, OI and glutes. L>R diaphragm and adductors and L rectus  abdominus insertion.   Special tests:   Scoliosis: mild R thoracic Supine-to-long-sit: R anterior rotation and L up-slip (ankles even in both but R ASIS anterior   Range of Motion/Flexibilty:  Spine: 6 in. From floor wit forward bend R SB ~ 1 finger from knee with pain in R SIJ L SB ~ 2 fingers from knee.   Hips:     Strength/MMT: deferred to follow up  LE  MMT  LE MMT Left Right  Hip flex:  (L2) /5 /5  Hip ext: 4/5 4+/5  Hip abd: 4/5 5/5  Hip add: 4/5 /5  Hip IR 4+/5 4/5  Hip ER 4+/5 4+/5       Abdominal:  Palpation: B Deep Hip Flexors, R>L  Diastasis:    Pelvic Floor External Exam: Introitus Appears:  Skin integrity:  Palpation: Cough: Prolapse visible?: Scar mobility:  Internal Vaginal Exam: Deferred to Follow up  Strength (PERF):  Symmetry: Palpation: Prolapse:   Internal Rectal Exam: Deferred to follow up  Strength (PERF): Symmetry: Palpation: Prolapse:   Gait Analysis: Deferred to follow up    Pelvic Floor Outcome Measures: FOTO   INTERVENTIONS THIS SESSION:   Total time: 60 minutes                      Objective measurements completed on examination: See above findings.                 PT Short Term Goals - 11/27/19 1522      PT SHORT TERM GOAL #1   Title Patient will demonstrate improved pelvic alignment and balance of musculature surrounding the pelvis to facilitate decreased PFM spasms and decrease pelvic pain.    Baseline posterior pelvic tilt, R rib shift, R scoliosis, spasms surrounding pelvis. 11-3 decreased R rib shift, decreased posterior pelvic tilt. 12/22: Pt. demonstrates ~80% improvement in pevic alignment and spasms but contiues to have some anterior pelvic tilt and mild R lumbar curve and spasms in the posterior R hip.    Time 5    Period Weeks    Status Achieved    Target Date 09/17/19      PT SHORT TERM GOAL #2   Title Patient will demonstrate HEP x1 in the clinic to demonstrate understanding and proper form to allow for further improvement.    Baseline Pt. lacks knowledge of therapeutic exercises that can help decrease her Sx. 11-3: pt compliance with HEP    Time 5    Period Weeks    Status Achieved    Target Date 07/30/19      PT SHORT TERM GOAL #3   Title Patient will demonstrate improved sitting and standing posture to demonstrate learning  and decrease stress on the pelvic floor with functional activity.    Baseline posterior pelvic tilt, R rib-shift; 11-3 decreaesd posterior pelvic tilt, improved R rib shift and improved. 12/22: some remaining anterior pelvic tilt, ~ 80% improved    Time 5    Period Weeks    Status Achieved    Target Date 09/17/19      PT SHORT TERM GOAL #4   Title Patient will demonstrate a coordinated contraction, relaxation, and bulge of the pelvic floor muscles to demonstrate functional recruitment and motion and allow for further strengthening.    Baseline Pt. demonstrates decreased recruitment and coordination of the PFM evidenced by her POP symptoms. 11-3 improved understanding of diphgragmatic breathing in relaxationship to PFM. 12/22: Pt. able to squeeze but continues to  have trouble relaxing PFM and decreased strength with squeeze due to recurrent PFM spasms.    Time 5    Period Weeks    Status Achieved    Target Date 09/17/19             PT Long Term Goals - 11/27/19 1522      PT LONG TERM GOAL #1   Title Patient will describe feeling of pressure no more than 5% of the time over the course of the past week to demonstrate improved recruitment and strength of the pelvic floor.    Baseline Pt. having increased POP sensation with walking for exercise, having a BM, sneezing, etc. As of 12/22: pt. reports 6/10 "uncomfortableness" in the pelvic area most of the time As of 1/26: reports average of  3/10 "uncomfortableness" in the pelvic area most of the time.    Time 10    Period Weeks    Status Achieved    Target Date 10/22/19      PT LONG TERM GOAL #2   Title Patient will score at or below 32/300 on the PFDI, 25% on the Female NIH-CPSI and 10/35 on the NDI to demonstrate a clinically meaningful decrease in disability and distress due to pelvic floor dysfunction.    Baseline PFDI: 77/300, NDI: 19/35, Female NIH-CPSI: 23/43 (53%) As of 08/13/19: PFDI:111/300, Female NIH: 27/43, did not reassess NDI  today.    Time 10    Period --    Status On-going    Target Date 01/15/20      PT LONG TERM GOAL #3   Title Patient will report no episodes of SUI over the course of the prior two weeks to demonstrate improved functional ability.    Baseline Having SUI with cough, sneeze, etc. As of 12/22: is able to decrease SUI when she remembers to do posterior pelvic tilt and kegel before sneeze, less when she does still have leakage. As of 1/26: leakage is mostly after having intercourse, less often with sneeze etc.    Time 10    Period Weeks    Status Achieved    Target Date 10/22/19      PT LONG TERM GOAL #4   Title Patient will describe pain no greater than 1/10 during laying down to demonstrate improved ability to sleep for overall health.    Baseline Pain as high as 10/10, worst when laying down in the neck, shoulders, r hip, (and migraines) As of 12-22: Pt. describes much less R hip and neck pain as well as less frequent migraines, high of 7/10 vaginally this week but it was short-lived and resolved As of 4/7: not having pelvic pain, haveng some R hip pain up to 7/10 occasionally though less often and R shoulder pain as high as 8/10 still impeding her ability to sleep well.    Time 10    Period Weeks    Status On-going    Target Date 01/15/20                   Patient will benefit from skilled therapeutic intervention in order to improve the following deficits and impairments:     Visit Diagnosis: No diagnosis found.     Problem List Patient Active Problem List   Diagnosis Date Noted  . Female cystocele 08/21/2020  . Pelvic floor dysfunction 08/21/2020  . Sty, external 05/14/2019  . Plantar fasciitis, bilateral 07/19/2017  . Sebaceous cyst of right axilla 07/19/2017  . Overweight (BMI 25.0-29.9) 04/28/2016  .  Migraines 04/04/2016    Cleophus MoltKeeli T Zylen Wenig 09/15/2020, 8:38 AM  [facility] [address] [place],  [place], [id number] Phone: [phone]   Fax:  [phone]  Name: Vinnie LevelKendra T Laiche MRN: [id number] Date of Birth: July 04, 1986

## 2020-09-17 NOTE — Therapy (Signed)
[facility] MAIN [facility] SERVICES [address] [place], [place], [address] Phone: [phone]   Fax:  [phone]  Physical Therapy Evaluation  The patient has been informed of current processes in place at Outpatient Rehab to protect patients from Covid-19 exposure including social distancing, schedule modifications, and new cleaning procedures. After discussing their particular risk with a therapist based on the patient's personal risk factors, the patient has decided to proceed with in-person therapy.  Patient Details  Name: Madison Randall MRN: [id number] Date of Birth: [birth year] No data recorded  Encounter Date: 09/15/2020   PT End of Session - 09/17/20 0744    Visit Number 1    Number of Visits 10    Date for PT Re-Evaluation 01/15/20    Authorization Type MCAID    Authorization Time Period 09/15/20 through 11/24/20    Authorization - Visit Number 1    Authorization - Number of Visits 10    Progress Note Due on Visit 10    PT Start Time 0830    PT Stop Time 0930    PT Time Calculation (min) 60 min    Activity Tolerance Patient tolerated treatment well;No increased pain    Behavior During Therapy WFL for tasks assessed/performed           Past Medical History:  Diagnosis Date  . Amenorrhea   . Migraine   . Migraines     Past Surgical History:  Procedure Laterality Date  . DILATION AND CURETTAGE OF UTERUS  09/2014 uterine infection form delivery 2015    There were no vitals filed for this visit.  Pelvic Floor Physical Therapy Evaluation and Assessment  SCREENING  Falls in last 6 mo: Yes, 4 months ago (about 6 months and 8 months gestation). Patient reports her right leg gave out and she fell twice. Walked with a walker for most of her pregnancy.   Interpreter Needed?: No Interpreter Agency:  Interpreter Name  Interpreter ID  Patient Declined Interpreter   Patient signed [facility] waiver    Patient's communication  preference:   Red Flags:  Have you had any night sweats? Yes- since baby 3 was born.  Unexplained weight loss? No  Saddle anesthesia? No  Unexplained changes in bowel or bladder habits? No   SUBJECTIVE  Patient reports: She was doing well following discharge for quite a while but over-did it one day re-arranging the kids' room and since then has been having pain with intercourse and feeling of pelvic heaviness again. She has been doing her HEP and it is helping her back and hip pain is still a lot better but she is nervous about even blowing her nose again. SUI has not been bad but even sweeping and vacuuming can increase her symptoms significantly. She is fearful again, though not as bad as it had been. She has had times where her shoulders have flared up but she gets out "her papers" and does her exercises and it gets better. 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    Recent Procedures/Tests/Findings:  Grade 1 POP  Obstetrical History: [id number]  2-3 stitches per delivery.   Gynecological History: none  Urinary History: - blowing nose- no urinary leakage but increased pressure "like is something is falling out"   Gastrointestinal History: "good pooper" no straining but  does feel increased POP pressure   Sexual activity/pain: Having pain with intercourse.  Location of pain: R hip  Current pain:  0/10  Max pain:  10/10 with activity and laying down Least pain:  0/10 Nature of pain: deep ache    Patient Goals: Get back to living life without fear of activity and POP pressure, decrease pain with intercourse and in R hip.   OBJECTIVE  Posture/Observations:  Sitting: resting feet on toes. Standing: L ASIS high, L PSIS high   Palpation/Segmental Motion/Joint Play: TTP to R>L hip-flexors and L >R QL. TTP to  L>R lumbar multifidus and B Piriformis, OI and glutes. L>R diaphragm and adductors and L rectus abdominus insertion.   Special tests:   Scoliosis: mild R thoracic Supine-to-long-sit: R anterior rotation and L up-slip (ankles even in both but R ASIS anterior   Range of Motion/Flexibilty:  Spine: 6 in. From floor wit forward bend R SB ~ 1 finger from knee with pain in R SIJ L SB ~ 2 fingers from knee. Hips:   Strength/MMT:  LE MMT  LE MMT Left Right  Hip flex:  (L2) /5 /5  Hip ext: 4/5 4+/5  Hip abd: 4/5 5/5  Hip add: 4/5 4+/5  Hip IR 4+/5 4/5  Hip ER 4+/5 4+/5       Abdominal:  Palpation: B Deep Hip Flexors, R>L  Diastasis: not re-assessed, not noted during prior episode.   Pelvic Floor External Exam: Deferred to follow-up Introitus Appears:  Skin integrity:  Palpation: Cough: Prolapse visible?: Scar mobility:  Internal Vaginal Exam: Deferred to Follow up  Strength (PERF):  Symmetry: Palpation: Prolapse:   Internal Rectal Exam: Deferred to follow up  Strength (PERF): Symmetry: Palpation: Prolapse:   Gait Analysis: Deferred to follow up    Pelvic Floor Outcome Measures: FOTO PFDI Prolapse: 58, PFDI Pain:58  INTERVENTIONS THIS SESSION: Therex: educated on and practiced posterior pelvic tilts with emphasis on TA, glute, and obliques recruitment in order. Reviewed side-stretch and hip-flexor stretches.  Self-care: discussed exam findings and POC, educated on where to purchase a tool for self TP release at home.   Total time: 60 minutes                      Objective measurements completed on examination: See above findings.                 PT Short Term Goals - 09/17/20 0746      PT SHORT TERM GOAL #1   Title Patient will demonstrate improved pelvic alignment and balance of musculature surrounding the pelvis to facilitate decreased PFM spasms and decrease pelvic pain.    Baseline L up-slip, R anterior rotation, R  thoracic scoliosis, flat-back posture.    Time 5    Period Weeks    Status New    Target Date 10/20/20      PT SHORT TERM GOAL #2   Title Patient will demonstrate a coordinated contraction, relaxation, and bulge of the pelvic floor muscles to demonstrate functional recruitment and motion and allow for further strengthening.    Baseline Pt. having PFM dysfuncton evidenced by POP Sx. and dyspareunia    Time 5    Period Weeks    Status New    Target Date 10/20/20      PT SHORT TERM GOAL #3   Title Patient will demonstrate improved sitting and standing posture to demonstrate learning and decrease stress on the pelvic floor with functional activity.  Baseline posterior pelvic tilt    Time 5    Period Weeks    Status New    Target Date 10/20/20      PT SHORT TERM GOAL #4   Title Patient will demonstrate a coordinated contraction, relaxation, and bulge of the pelvic floor muscles to demonstrate functional recruitment and motion and allow for further strengthening.    Baseline Pt. demonstrates decreased recruitment and coordination of the PFM evidenced by her POP symptoms.    Time 5    Period Weeks    Status New    Target Date 10/20/20             PT Long Term Goals - 09/17/20 0748      PT LONG TERM GOAL #1   Title Patient will describe feeling of pressure no more than 5% of the time over the course of the past week to demonstrate improved recruitment and strength of the pelvic floor.    Baseline Pt. having increased POP sensation with walkingsweeping, vacuuming, sneezing etc.    Time 10    Period Weeks    Status New    Target Date 11/25/19      PT LONG TERM GOAL #2   Title Pt. will improve in FOTO score by 20 points in each category to demonstrate improved function.    Baseline FOTO PFDI  Prolapse:58  PFDI  Pain:58    Time 10    Status New    Target Date 11/24/20      PT LONG TERM GOAL #3   Title Patient will report no episodes of SUI over the course of the prior two  weeks to demonstrate improved functional ability.    Baseline Having mild SUI with cough, sneeze, etc.    Time 10    Period Weeks    Status New    Target Date 11/24/20      PT LONG TERM GOAL #4   Title Patient will report no pain with intercourse to demonstrate improved functional ability.    Baseline Pt. having pain great enough to keep her from participating in intercourse.    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    Baseline L>R hip weakness, likely contributing to pelvic instability that allowed for Sx. to reccur.    Time 10    Period Weeks    Status New    Target Date 11/24/20                  Plan - 09/17/20 0746    Clinical Impression Statement Pt. reports today with cheif c/o POP Sx. including a sensation of bulge and mild SUI as well as dyspareunia. Her PMH is significant for 5 vaginal deliveries, mild scoliosis, and chronic R hip pain. She has been successfully treated for this concern during prior PT episode but has recurrent symptoms following over-exertion with moving and organizing furnature within the home. Her clinical assessment revealed a L up-slip and R anterior rotation as well as spasms surrounding the pelvis and hip weakness through L>R. She will benefit from skilled pelvic health PT to address the noted deficits and to continue to assess for and address any other potential causes of Sx.  Personal Factors and Comorbidities Comorbidity 3+    Comorbidities Migraines, Plantar Fasciitis, Scoliosis    Examination-Activity Limitations Sit;Sleep;Lift;Squat;Bend;Caring for Others;Locomotion Level;Stand;Transfers;Toileting    Examination-Participation Restrictions Interpersonal Relationship;Community  Activity;Shop;Driving;Meal Prep    Stability/Clinical Decision Making Evolving/Moderate complexity    Clinical Decision Making Moderate    Rehab Potential Good    PT Frequency 1x / week    PT Duration Other (comment)   10 weeks   PT Treatment/Interventions ADLs/Self Care Home Management;Biofeedback;Electrical Stimulation;Moist Heat;Traction;Gait training;Functional mobility Network engineer;Therapeutic activities;Therapeutic exercise;Balance training;Neuromuscular re-education;Patient/family education;Manual techniques;Scar mobilization;Passive range of motion;Taping;Dry needling;Visual/perceptual remediation/compensation;Spinal Manipulations;Joint Manipulations    PT Next Visit Plan address L up-slip and R anterior rotation, review deep-core and add glute max/med in standing, 3-way wall stretches,assess PFM when appropriate and teach self internal TP release.    PT Home Exercise Plan R rib shift correction; diaphgramatic breathing; R QL stretch; log rolling and STS edu; side plank; tennis ball TP release; Prone hip-flexor stretch/hold-release; seated and hooklying chin tucks; bridges; given info on internal release and self infernal release materials, pre-squeeze and sneeze, chest stretch, scapular retraction/depression, seated and supine pelvic tilts with/without pillow, rotator cuff program, SCM stretch. SLS balance. NEW: pelvic tilts in supine, hip-flexor stretch, side-stretch.    Consulted and Agree with Plan of Care Patient           Patient will benefit from skilled therapeutic intervention in order to improve the following deficits and impairments:  Decreased balance,Decreased endurance,Difficulty walking,Increased muscle spasms,Improper body mechanics,Decreased activity tolerance,Decreased coordination,Decreased strength,Increased fascial restricitons,Postural dysfunction,Pain  Visit Diagnosis: Other muscle spasm  Abnormal posture  Other idiopathic scoliosis, thoracolumbar  region     Problem List Patient Active Problem List   Diagnosis Date Noted  . Female cystocele 08/21/2020  . Pelvic floor dysfunction 08/21/2020  . Sty, external 05/14/2019  . Plantar fasciitis, bilateral 07/19/2017  . Sebaceous cyst of right axilla 07/19/2017  . Overweight (BMI 25.0-29.9) 04/28/2016  . Migraines 04/04/2016   Cleophus Molt DPT, ATC Cleophus Molt 09/17/2020, 7:55 AM  [facility] MAIN [facility] SERVICES [address] [place], [place], [address] Phone: [phone]   Fax:  [phone]  Name: Madison Randall MRN: [id number] Date of Birth: 24-Apr-1986

## 2020-10-02 ENCOUNTER — Ambulatory Visit: Payer: Medicaid Other

## 2020-10-09 ENCOUNTER — Other Ambulatory Visit: Payer: Self-pay

## 2020-10-09 ENCOUNTER — Ambulatory Visit: Payer: Medicaid Other | Attending: Obstetrics and Gynecology

## 2020-10-09 DIAGNOSIS — M4125 Other idiopathic scoliosis, thoracolumbar region: Secondary | ICD-10-CM | POA: Insufficient documentation

## 2020-10-09 DIAGNOSIS — R293 Abnormal posture: Secondary | ICD-10-CM | POA: Insufficient documentation

## 2020-10-09 DIAGNOSIS — M62838 Other muscle spasm: Secondary | ICD-10-CM | POA: Insufficient documentation

## 2020-10-09 NOTE — Therapy (Signed)
[place] MAIN [facility] SERVICES [address] [place], [place], [address] Phone: [phone]   Fax:  [phone]  Physical Therapy Treatment  The patient has been informed of current processes in place at Outpatient Rehab to protect patients from Covid-19 exposure including social distancing, schedule modifications, and new cleaning procedures. After discussing their particular risk with a therapist based on the patient's personal risk factors, the patient has decided to proceed with in-person therapy.  Patient Details  Name: Madison Randall MRN: [id number] Date of Birth: 12-May-1986 No data recorded  Encounter Date: 10/09/2020   PT End of Session - 10/09/20 1204    Visit Number 2    Number of Visits 10    Date for PT Re-Evaluation 01/15/20    Authorization Type [employer]    Authorization Time Period 09/15/20 through 11/24/20    Authorization - Visit Number 2    Authorization - Number of Visits 10    Progress Note Due on Visit 10    PT Start Time 0830    PT Stop Time 0930    PT Time Calculation (min) 60 min    Activity Tolerance Patient tolerated treatment well;No increased pain    Behavior During Therapy WFL for tasks assessed/performed           Past Medical History:  Diagnosis Date  . Amenorrhea   . Migraine   . Migraines     Past Surgical History:  Procedure Laterality Date  . DILATION AND CURETTAGE OF UTERUS  09/2014 uterine infection form delivery 2015    There were no vitals filed for this visit.   Pelvic Floor Physical Therapy Treatment Note  SCREENING  Changes in medications, allergies, or medical history?: none    SUBJECTIVE  Patient reports: She has been doing Pilates for PFM workouts and has connected better with the PFM but feels like she is sore from using those muscles. When she does some of the exercises but her hip pops and does something wierd. Her periods have been much heavier and her Migraines are worse. She  Tried to use a tampon and felt like she "hit her bladder" so she just used pads. She is doing better but feels the pressure when she is trying to shave her legs and it is uncomfortable.    Precautions:  18 months PP  Pain update:  Sexual activity/pain: Having pain with intercourse.  Location of pain: L>R hip Current pain:  8/10  Max pain:  8/10 Least pain:  4/10 Nature of pain: Ache  Patient Goals: Get back to living life without fear of activity and POP pressure, decrease pain with intercourse and in R hip.   OBJECTIVE  Changes in: Posture/Observations:  R anterior innominate rotation. L hip slightly elevated. Hip-scouring test: positive for labral tear on R  Range of Motion/Flexibilty:    Strength/MMT:  LE MMT:  Pelvic floor:  Abdominal:   Palpation: TTP to L Ql and R>L iliacus.  Gait Analysis:  INTERVENTIONS THIS SESSION: Therex: reviewed core exercises that Pt. Is doing on her own and gave corrections to prevent hip pain.   Manual: re-assessed pelvic alignment, performed TP release and STM to R iliacus and L QL followed by MET correction x1 to decrease spasm and pain and allow for improved balance of musculature for improved function and decreased symptoms.   Dry needle: Performed TPDN with a .30x169m to R Iliacus and >30x764mneedle to L QL and standard approach as described below to decrease  spasm and pain and allow for improved balance of musculature for improved function and decreased symptoms.  Total time: 60 min.                               PT Short Term Goals - 09/17/20 0746      PT SHORT TERM GOAL #1   Title Patient will demonstrate improved pelvic alignment and balance of musculature surrounding the pelvis to facilitate decreased PFM spasms and decrease pelvic pain.    Baseline L up-slip, R anterior rotation, R thoracic scoliosis, flat-back posture.    Time 5    Period Weeks    Status New    Target Date 10/20/20       PT SHORT TERM GOAL #2   Title Patient will demonstrate a coordinated contraction, relaxation, and bulge of the pelvic floor muscles to demonstrate functional recruitment and motion and allow for further strengthening.    Baseline Pt. having PFM dysfuncton evidenced by POP Sx. and dyspareunia    Time 5    Period Weeks    Status New    Target Date 10/20/20      PT SHORT TERM GOAL #3   Title Patient will demonstrate improved sitting and standing posture to demonstrate learning and decrease stress on the pelvic floor with functional activity.    Baseline posterior pelvic tilt    Time 5    Period Weeks    Status New    Target Date 10/20/20      PT SHORT TERM GOAL #4   Title Patient will demonstrate a coordinated contraction, relaxation, and bulge of the pelvic floor muscles to demonstrate functional recruitment and motion and allow for further strengthening.    Baseline Pt. demonstrates decreased recruitment and coordination of the PFM evidenced by her POP symptoms.    Time 5    Period Weeks    Status New    Target Date 10/20/20             PT Long Term Goals - 09/17/20 0748      PT LONG TERM GOAL #1   Title Patient will describe feeling of pressure no more than 5% of the time over the course of the past week to demonstrate improved recruitment and strength of the pelvic floor.    Baseline Pt. having increased POP sensation with walkingsweeping, vacuuming, sneezing etc.    Time 10    Period Weeks    Status New    Target Date 11/25/19      PT LONG TERM GOAL #2   Title Pt. will improve in FOTO score by 20 points in each category to demonstrate improved function.    Baseline FOTO PFDI  Prolapse:58  PFDI  Pain:58    Time 10    Status New    Target Date 11/24/20      PT LONG TERM GOAL #3   Title Patient will report no episodes of SUI over the course of the prior two weeks to demonstrate improved functional ability.    Baseline Having mild SUI with cough, sneeze, etc.    Time  10    Period Weeks    Status New    Target Date 11/24/20      PT LONG TERM GOAL #4   Title Patient will report no pain with intercourse to demonstrate improved functional ability.    Baseline Pt. having pain great enough to keep her from participating  in intercourse.    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    Baseline L>R hip weakness, likely contributing to pelvic instability that allowed for Sx. to reccur.    Time 10    Period Weeks    Status New    Target Date 11/24/20                 Plan - 10/09/20 1204    Clinical Impression Statement Pt. Responded well to all interventions today, demonstrating improved pelvic alignment and resolution of pain as well as understanding and correct performance of all education and exercises provided today. They will continue to benefit from skilled physical therapy to work toward remaining goals and maximize function as well as decrease likelihood of symptom increase or recurrence.    Personal Factors and Comorbidities Comorbidity 3+    Comorbidities Migraines, Plantar Fasciitis, Scoliosis    Examination-Activity Limitations Sit;Sleep;Lift;Squat;Bend;Caring for Others;Locomotion Level;Stand;Transfers;Toileting    Examination-Participation Restrictions Interpersonal Relationship;Community Activity;Shop;Driving;Meal Prep    Stability/Clinical Decision Making Evolving/Moderate complexity    Rehab Potential Good    PT Frequency 1x / week    PT Duration Other (comment)   10 weeks   PT Treatment/Interventions ADLs/Self Care Home Management;Biofeedback;Electrical Stimulation;Moist Heat;Traction;Gait training;Functional mobility Scientist, forensic;Therapeutic activities;Therapeutic exercise;Balance  training;Neuromuscular re-education;Patient/family education;Manual techniques;Scar mobilization;Passive range of motion;Taping;Dry needling;Visual/perceptual remediation/compensation;Spinal Manipulations;Joint Manipulations    PT Next Visit Plan Review deep-core/ expand and review planks and boat pose progression and add glute max/med in standing, 3-way wall stretches,assess PFM and teach self internal TP release.    PT Home Exercise Plan R rib shift correction; diaphgramatic breathing; R QL stretch; log rolling and STS edu; side plank; tennis ball TP release; Prone hip-flexor stretch/hold-release; seated and hooklying chin tucks; bridges; given info on internal release and self infernal release materials, pre-squeeze and sneeze, chest stretch, scapular retraction/depression, seated and supine pelvic tilts with/without pillow, rotator cuff program, SCM stretch. SLS balance. NEW: pelvic tilts in supine, hip-flexor stretch, side-stretch.    Consulted and Agree with Plan of Care Patient           Patient will benefit from skilled therapeutic intervention in order to improve the following deficits and impairments:  Decreased balance,Decreased endurance,Difficulty walking,Increased muscle spasms,Improper body mechanics,Decreased activity tolerance,Decreased coordination,Decreased strength,Increased fascial restricitons,Postural dysfunction,Pain  Visit Diagnosis: Other muscle spasm  Abnormal posture  Other idiopathic scoliosis, thoracolumbar region     Problem List Patient Active Problem List   Diagnosis Date Noted  . Female cystocele 08/21/2020  . Pelvic floor dysfunction 08/21/2020  . Sty, external 05/14/2019  . Plantar fasciitis, bilateral 07/19/2017  . Sebaceous cyst of right axilla 07/19/2017  . Overweight (BMI 25.0-29.9) 04/28/2016  . Migraines 04/04/2016   Willa Rough DPT, ATC Willa Rough 10/09/2020, 12:14 PM  [place] MAIN [facility]  SERVICES [address] [place], [place], [address] Phone: [phone]   Fax:  [phone]  Name: Madison Randall MRN: [id number] Date of Birth: [birth year]

## 2020-10-23 ENCOUNTER — Other Ambulatory Visit: Payer: Self-pay

## 2020-10-23 ENCOUNTER — Ambulatory Visit: Payer: Medicaid Other | Attending: Obstetrics and Gynecology

## 2020-10-23 DIAGNOSIS — R293 Abnormal posture: Secondary | ICD-10-CM | POA: Insufficient documentation

## 2020-10-23 DIAGNOSIS — M4125 Other idiopathic scoliosis, thoracolumbar region: Secondary | ICD-10-CM | POA: Insufficient documentation

## 2020-10-23 DIAGNOSIS — M62838 Other muscle spasm: Secondary | ICD-10-CM | POA: Diagnosis present

## 2020-10-23 NOTE — Therapy (Addendum)
[place] [facility] MAIN [facility] SERVICES [address] [place], [place], [address] Phone: [phone]   Fax:  [phone]  Physical Therapy Treatment  The patient has been informed of current processes in place at Outpatient Rehab to protect patients from Covid-19 exposure including social distancing, schedule modifications, and new cleaning procedures. After discussing their particular risk with a therapist based on the patient's personal risk factors, the patient has decided to proceed with in-person therapy.   Patient Details  Name: Madison Randall MRN: [id number] Date of Birth: 1986-07-07 No data recorded  Encounter Date: 10/23/2020   PT End of Session - 10/27/20 1108    Visit Number 3    Number of Visits 10    Date for PT Re-Evaluation 01/15/20    Authorization Type [employer]    Authorization Time Period 09/15/20 through 11/24/20    Authorization - Visit Number 3    Authorization - Number of Visits 10    Progress Note Due on Visit 10    PT Start Time 0835    PT Stop Time 0935    PT Time Calculation (min) 60 min    Activity Tolerance Patient tolerated treatment well;No increased pain    Behavior During Therapy WFL for tasks assessed/performed           Past Medical History:  Diagnosis Date  . Amenorrhea   . Migraine   . Migraines     Past Surgical History:  Procedure Laterality Date  . DILATION AND CURETTAGE OF UTERUS  09/2014 uterine infection form delivery 2015    There were no vitals filed for this visit.    Pelvic Floor Physical Therapy Treatment Note  SCREENING  Changes in medications, allergies, or medical history?: none    SUBJECTIVE  Patient reports: Things are pretty good with the pelvic floor. She was able to be intimate twice over the past week so overall she feels really good. Her R hip is still hurting when she walks. She notices pain in her neck when she is at church.   Precautions:  18 months PP  Pain  update:  Sexual activity/pain: Having pain with intercourse.  Location of pain: L>R hip Current pain:  5/10  Max pain:  8/10 Least pain:  5/10 Nature of pain: Ache  Patient Goals: Get back to living life without fear of activity and POP pressure, decrease pain with intercourse and in R hip.   OBJECTIVE  Changes in: Posture/Observations:  R anterior innominate rotation. L hip slightly elevated. Hip-scouring test: positive for labral tear on R  Range of Motion/Flexibilty:  Decreased fascial mobility through R lateral and posterior hip and thigh  Strength/MMT:  LE MMT:  Pelvic floor:  Abdominal:   Palpation: TTP to R TFL, Vastus lateralis, OI, and Piriformis  Gait Analysis:  INTERVENTIONS THIS SESSION:  Manual: Performed TP release to R TFL, Vastus lateralis, OI, and Piriformis followed by static and dynamic cupping to R lateral thigh to decrease spasm and pain and allow for improved balance of musculature for improved function and decreased symptoms.   Total time: 60 min.                                  PT Short Term Goals - 09/17/20 0746      PT SHORT TERM GOAL #1   Title Patient will demonstrate improved pelvic alignment and balance of musculature surrounding the pelvis to facilitate  decreased PFM spasms and decrease pelvic pain.    Baseline L up-slip, R anterior rotation, R thoracic scoliosis, flat-back posture.    Time 5    Period Weeks    Status New    Target Date 10/20/20      PT SHORT TERM GOAL #2   Title Patient will demonstrate a coordinated contraction, relaxation, and bulge of the pelvic floor muscles to demonstrate functional recruitment and motion and allow for further strengthening.    Baseline Pt. having PFM dysfuncton evidenced by POP Sx. and dyspareunia    Time 5    Period Weeks    Status New    Target Date 10/20/20      PT SHORT TERM GOAL #3   Title Patient will demonstrate improved sitting and standing  posture to demonstrate learning and decrease stress on the pelvic floor with functional activity.    Baseline posterior pelvic tilt    Time 5    Period Weeks    Status New    Target Date 10/20/20      PT SHORT TERM GOAL #4   Title Patient will demonstrate a coordinated contraction, relaxation, and bulge of the pelvic floor muscles to demonstrate functional recruitment and motion and allow for further strengthening.    Baseline Pt. demonstrates decreased recruitment and coordination of the PFM evidenced by her POP symptoms.    Time 5    Period Weeks    Status New    Target Date 10/20/20             PT Long Term Goals - 09/17/20 0748      PT LONG TERM GOAL #1   Title Patient will describe feeling of pressure no more than 5% of the time over the course of the past week to demonstrate improved recruitment and strength of the pelvic floor.    Baseline Pt. having increased POP sensation with walkingsweeping, vacuuming, sneezing etc.    Time 10    Period Weeks    Status New    Target Date 11/25/19      PT LONG TERM GOAL #2   Title Pt. will improve in FOTO score by 20 points in each category to demonstrate improved function.    Baseline FOTO PFDI  Prolapse:58  PFDI  Pain:58    Time 10    Status New    Target Date 11/24/20      PT LONG TERM GOAL #3   Title Patient will report no episodes of SUI over the course of the prior two weeks to demonstrate improved functional ability.    Baseline Having mild SUI with cough, sneeze, etc.    Time 10    Period Weeks    Status New    Target Date 11/24/20      PT LONG TERM GOAL #4   Title Patient will report no pain with intercourse to demonstrate improved functional ability.    Baseline Pt. having pain great enough to keep her from participating in intercourse.    Time 10    Period Weeks    Status New    Target Date 11/24/20      PT LONG TERM GOAL #5   Title Patient will demonstrate 4+/5 strength or better in the PF and surrounding  musculature to show improved functional strength for childcare and other vocational and recreational activities.    Baseline L>R hip weakness, likely contributing to pelvic instability that allowed for Sx. to reccur.    Time 10  Period Weeks    Status New    Target Date 11/24/20                 Plan - 10/27/20 1108    Clinical Impression Statement Pt. Responded well to all interventions today, demonstrating improved fascial mobility, decreased spasm, and resolution of pain, as well as understanding and correct performance of all education and exercises provided today. They will continue to benefit from skilled physical therapy to work toward remaining goals and maximize function as well as decrease likelihood of symptom increase or recurrence.    Personal Factors and Comorbidities Comorbidity 3+    Comorbidities Migraines, Plantar Fasciitis, Scoliosis    Examination-Activity Limitations Sit;Sleep;Lift;Squat;Bend;Caring for Others;Locomotion Level;Stand;Transfers;Toileting    Examination-Participation Restrictions Interpersonal Relationship;Community Activity;Shop;Driving;Meal Prep    Stability/Clinical Decision Making Evolving/Moderate complexity    Rehab Potential Good    PT Frequency 1x / week    PT Duration Other (comment)   10 weeks   PT Treatment/Interventions ADLs/Self Care Home Management;Biofeedback;Electrical Stimulation;Moist Heat;Traction;Gait training;Functional mobility Network engineer;Therapeutic activities;Therapeutic exercise;Balance training;Neuromuscular re-education;Patient/family education;Manual techniques;Scar mobilization;Passive range of motion;Taping;Dry needling;Visual/perceptual remediation/compensation;Spinal Manipulations;Joint Manipulations    PT Next Visit Plan Review deep-core/ expand and review planks and boat pose progression and add glute max/med in standing, 3-way wall stretches,assess PFM and teach self internal TP release.    PT Home  Exercise Plan R rib shift correction; diaphgramatic breathing; R QL stretch; log rolling and STS edu; side plank; tennis ball TP release; Prone hip-flexor stretch/hold-release; seated and hooklying chin tucks; bridges; given info on internal release and self infernal release materials, pre-squeeze and sneeze, chest stretch, scapular retraction/depression, seated and supine pelvic tilts with/without pillow, rotator cuff program, SCM stretch. SLS balance. NEW: pelvic tilts in supine, hip-flexor stretch, side-stretch.    Consulted and Agree with Plan of Care Patient           Patient will benefit from skilled therapeutic intervention in order to improve the following deficits and impairments:  Decreased balance,Decreased endurance,Difficulty walking,Increased muscle spasms,Improper body mechanics,Decreased activity tolerance,Decreased coordination,Decreased strength,Increased fascial restricitons,Postural dysfunction,Pain  Visit Diagnosis: Other muscle spasm  Abnormal posture  Other idiopathic scoliosis, thoracolumbar region     Problem List Patient Active Problem List   Diagnosis Date Noted  . Female cystocele 08/21/2020  . Pelvic floor dysfunction 08/21/2020  . Sty, external 05/14/2019  . Plantar fasciitis, bilateral 07/19/2017  . Sebaceous cyst of right axilla 07/19/2017  . Overweight (BMI 25.0-29.9) 04/28/2016  . Migraines 04/04/2016   Cleophus Molt DPT, ATC Cleophus Molt 10/27/2020, 11:10 AM   [facility] MAIN [facility] SERVICES [address] [place], [place], [address] Phone: [phone]   Fax:  [phone]  Name: Madison Randall MRN: [id number] Date of Birth: 1986/06/01

## 2020-10-27 ENCOUNTER — Encounter: Payer: Self-pay | Admitting: Obstetrics and Gynecology

## 2020-10-27 ENCOUNTER — Other Ambulatory Visit: Payer: Self-pay

## 2020-10-27 ENCOUNTER — Ambulatory Visit (INDEPENDENT_AMBULATORY_CARE_PROVIDER_SITE_OTHER): Payer: Medicaid Other | Admitting: Obstetrics and Gynecology

## 2020-10-27 ENCOUNTER — Other Ambulatory Visit (HOSPITAL_COMMUNITY)
Admission: RE | Admit: 2020-10-27 | Discharge: 2020-10-27 | Disposition: A | Discharge status: Home / Self Care | Payer: Medicaid Other | Source: Ambulatory Visit | Attending: Obstetrics and Gynecology | Admitting: Obstetrics and Gynecology

## 2020-10-27 VITALS — BP 122/80 | HR 74 | Ht 63.0 in | Wt 182.9 lb

## 2020-10-27 DIAGNOSIS — Z124 Encounter for screening for malignant neoplasm of cervix: Secondary | ICD-10-CM | POA: Diagnosis not present

## 2020-10-27 DIAGNOSIS — E669 Obesity, unspecified: Secondary | ICD-10-CM | POA: Diagnosis not present

## 2020-10-27 DIAGNOSIS — N920 Excessive and frequent menstruation with regular cycle: Secondary | ICD-10-CM

## 2020-10-27 DIAGNOSIS — Z01419 Encounter for gynecological examination (general) (routine) without abnormal findings: Secondary | ICD-10-CM

## 2020-10-27 DIAGNOSIS — G43009 Migraine without aura, not intractable, without status migrainosus: Secondary | ICD-10-CM | POA: Diagnosis not present

## 2020-10-27 DIAGNOSIS — M6289 Other specified disorders of muscle: Secondary | ICD-10-CM

## 2020-10-27 MED ORDER — PHENTERMINE HCL 15 MG PO CAPS: 15.0000 mg | ORAL_CAPSULE | ORAL | 0 refills | Status: DC

## 2020-10-27 MED ORDER — PHENTERMINE HCL 30 MG PO CAPS: 30.0000 mg | ORAL_CAPSULE | ORAL | 0 refills | Status: DC

## 2020-10-27 NOTE — Progress Notes (Signed)
GYNECOLOGY ANNUAL PHYSICAL EXAM PROGRESS NOTE  Subjective:    Madison Randall is a 35 y.o. [phone] female who presents for an annual exam.  The patient is not sexually active.  The patient wears seatbelts: yes. The patient participates in regular exercise: yes. Has the patient ever been transfused or tattooed?: no. The patient reports that there is not domestic violence in her life.    The patient has the following complaints today: 1. Has concerns about her thyroid. Has had some pain in her neck on the right for a while.  She has felt something "swell and pop". Continues to have intermittent pain. Also notes that it has been more difficult to lose weight.  2. Reports that over the last several months her cycles have worsened, where she has had heavier flow, worsening pain, and more acne.  3. She is also experiencing more migraines lately.  Has had migraines for years, has remote history of use of medication to control, however has not required in several years. Is unsure of the cause, or if this is related to the change in her cycles.  4. She desires to report that since resuming physical therapy, her pelvic floor dysfunction has improved.  Is very happy with results so far.  5. Lastly, she notes that she is still struggling to lose weight. Is doing a "semi-Keto" diet.  Has increased her water intake.  Also attempted use of OTC supplements including Goli (did not notice any difference in weight), and Alli, however discontinued use after 1 month due to uncontrollable gas and orange oily anal leakage. Is exercising in some form almost daily (walking, playing sports with her kids, lifting weights). Also is working on portion control, and has significantly reduced carbohydrate intake, sodas, and fast food.     Menstrual History: Menarche age: [age] or [age] Patient's last menstrual period was 10/09/2020 (approximate). Period Duration (Days): 8 Period Pattern: (!) Irregular Menstrual Flow:  Heavy Menstrual Control: Maxi pad,Tampon Menstrual Control Change Freq (Hours): 2-3 Dysmenorrhea: (!) Severe Dysmenorrhea Symptoms: Cramping,Headache   Gynecologic History: Contraception: vasectomy History of STI's: Denies Last Pap: 10/09/2020. Results were: normal.  Denies h/o abnormal pap smears.     Upstream - 10/28/20 2337      Pregnancy Intention Screening   Does the patient want to become pregnant in the next year? No    Does the patient's partner want to become pregnant in the next year? No    Would the patient like to discuss contraceptive options today? No      Contraception Wrap Up   Current Method Vasectomy    Contraception Counseling Provided No          The pregnancy intention screening data noted above was reviewed. Potential methods of contraception were not discussed. Patient's husband has had a vasectomy.   OB History  Gravida Para Term Preterm AB Living  6 5 5  0 1 5  SAB IAB Ectopic Multiple Live Births  1 0 0 0 5    # Outcome Date GA Lbr Len/2nd Weight Sex Delivery Anes PTL Lv  6 Term 03/16/19 5160w5d / 00:09 8 lb 3.6 oz (3.73 kg) F Vag-Spont EPI  LIV     Name: Kusch,GIRL Toini     Apgar1: 8  Apgar5: 9  5 Term 10/28/16 431w3d 02:32 / 00:21 7 lb 13.2 oz (3.55 kg) M Vag-Spont EPI  LIV     Name: Pfefferkorn,BOY Yuna     Apgar1: 8  Apgar5: 9  4 SAB 12/02/15        ND  3 Term 08/21/14 [redacted]w[redacted]d  9 lb 4 oz (4.196 kg) F Vag-Spont  N LIV  2 Term 02/15/10 [redacted]w[redacted]d  8 lb 13 oz (3.997 kg) F Vag-Spont  N LIV  1 Term 12/10/07 [redacted]w[redacted]d  8 lb 4 oz (3.742 kg) M Vag-Spont   LIV    Past Medical History:  Diagnosis Date   Amenorrhea    Migraine    Migraines     Past Surgical History:  Procedure Laterality Date   DILATION AND CURETTAGE OF UTERUS  09/2014 uterine infection form delivery 2015    Family History  Problem Relation Age of Onset   Heart disease Father    Alcohol abuse Father    Breast cancer Maternal Grandmother    Thyroid disease Maternal  Grandmother    Heart disease Maternal Grandfather    Breast cancer Paternal Grandmother    Breast cancer Other    Other Mother        trigeminal neuralgia    Social History   Socioeconomic History   Marital status: Married    Spouse name: Jimmy   Number of children: Not on file   Years of education: Not on file   Highest education level: Not on file  Occupational History   Not on file  Tobacco Use   Smoking status: Former Smoker   Smokeless tobacco: Never Used  Building services engineer Use: Never used  Substance and Sexual Activity   Alcohol use: No   Drug use: No   Sexual activity: Yes    Birth control/protection: None    Comment: vastomoy  Other Topics Concern   Not on file  Social History Narrative   Not on file   Social Determinants of Health   Financial Resource Strain: Not on file  Food Insecurity: Not on file  Transportation Needs: Not on file  Physical Activity: Not on file  Stress: Not on file  Social Connections: Not on file  Intimate Partner Violence: Not on file    No current outpatient medications on file prior to visit.   No current facility-administered medications on file prior to visit.    Allergies  Allergen Reactions   Tdap [Tetanus-Diphth-Acell Pertussis] Anaphylaxis   Imitrex [Sumatriptan] Other (See Comments)    syncope   Topamax [Topiramate] Palpitations      Review of Systems Constitutional: negative for chills, fatigue, fevers and sweats Eyes: negative for irritation, redness and visual disturbance Ears, nose, mouth, throat, and face: negative for hearing loss, nasal congestion, snoring and tinnitus Respiratory: negative for asthma, cough, sputum Cardiovascular: negative for chest pain, dyspnea, exertional chest pressure/discomfort, irregular heart beat, palpitations and syncope Gastrointestinal: negative for abdominal pain, change in bowel habits, nausea and vomiting Genitourinary: negative for abnormal  menstrual periods, genital lesions, sexual problems and vaginal discharge, dysuria and urinary incontinence Integument/breast: negative for breast lump, breast tenderness and nipple discharge Hematologic/lymphatic: negative for bleeding and easy bruising Musculoskeletal:negative for back pain and muscle weakness Neurological: negative for dizziness, headaches, vertigo and weakness Endocrine: negative for diabetic symptoms including polydipsia, polyuria and skin dryness Allergic/Immunologic: negative for hay fever and urticaria        Objective:  Blood pressure 122/80, pulse 74, height 5\' 3"  (1.6 m), weight 182 lb 14.4 oz (83 kg), last menstrual period 10/09/2020, currently breastfeeding. Body mass index is 32.4 kg/m.  General Appearance:    Alert, cooperative, no distress, appears stated age, mild obesity  Head:  Normocephalic, without obvious abnormality, atraumatic  Eyes:    PERRL, conjunctiva/corneas clear, EOM's intact, both eyes  Ears:    Normal external ear canals, both ears  Nose:   Nares normal, septum midline, mucosa normal, no drainage or sinus tenderness  Throat:   Lips, mucosa, and tongue normal; teeth and gums normal  Neck:   Supple, symmetrical, trachea midline, no adenopathy; thyroid: no enlargement/tenderness/nodules; no carotid bruit or JVD  Back:     Symmetric, no curvature, ROM normal, no CVA tenderness  Lungs:     Clear to auscultation bilaterally, respirations unlabored  Chest Wall:    No tenderness or deformity   Heart:    Regular rate and rhythm, S1 and S2 normal, no murmur, rub or gallop  Breast Exam:    No tenderness, masses, or nipple abnormality  Abdomen:     Soft, non-tender, bowel sounds active all four quadrants, no masses, no organomegaly.    Genitalia:    Pelvic:external genitalia normal, vagina without lesions, discharge, or tenderness, rectovaginal septum  normal. Cervix normal in appearance, no cervical motion tenderness, no adnexal masses or  tenderness.  Uterus normal size, shape, mobile, regular contours, nontender.  Rectal:    Normal external sphincter.  No hemorrhoids appreciated. Internal exam not done.   Extremities:   Extremities normal, atraumatic, no cyanosis or edema  Pulses:   2+ and symmetric all extremities  Skin:   Skin color, texture, turgor normal, no rashes or lesions  Lymph nodes:   Cervical, supraclavicular, and axillary nodes normal  Neurologic:   CNII-XII intact, normal strength, sensation and reflexes throughout   .  Labs:  Lab Results  Component Value Date   WBC 10.2 03/18/2019   HGB 11.9 05/14/2019   HCT 36.0 05/14/2019   MCV 81.5 03/18/2019   PLT 267 03/18/2019    Lab Results  Component Value Date   CREATININE 0.57 03/18/2019   BUN 7 03/18/2019   NA 140 03/18/2019   K 3.8 03/18/2019   CL 110 03/18/2019   CO2 22 03/18/2019    Lab Results  Component Value Date   AST 22 08/01/2014    Lab Results  Component Value Date   TSH 1.350 03/05/2019     Assessment:   1. Encounter for well woman exam with routine gynecological exam   2. Pap smear for cervical cancer screening   3. Migraine without aura and without status migrainosus, not intractable   4. Obesity (BMI 30.0-34.9)   5. Menorrhagia with regular cycle   6. Pelvic floor dysfunction      Plan:    - Blood tests: see orders. - Breast self exam technique reviewed and patient encouraged to perform self-exam monthly. - Contraception: vasectomy. - Discussed healthy lifestyle modifications. - Pap smear up to date. - Migrainse worsening without clear cause.  Patient utilizes cool compresses, lying in dark quiet room, takes NSAIDS/Tylenol. Discussed option of resuming medications for migraines, or consider hormonal supplementation as it could be related to the change in her cycles. Declines for now, will continue current management.  - Continue physical therapy for pelvic floor dysfunction as it is currently working well.  -  Discussed weight loss options with patient. Has tried OTC weight loss supplements with noted side effects or ineffectiveness.  The risks and benefits and side effects of medication, such as Adipex (Phenteramine),  Belviq (lorcarsin), Contrave (buproprion/naltrexone), Qsymia (phentermine/topiramate), and Saxenda (liraglutide) were discussed. The pros and cons of suppressing appetite and boosting metabolism is discussed. Risks of  tolerence and addiction is discussed for selected agents discussed. Use of medicine will ne short term, such as 3-4 months at a time followed by a period of time off of the medicine to avoid these risks and side effects for Adipex discussed. Will prescribe. Pt to call with any negative side effects and agrees to keep follow up appts. Will return in 1 month to reassess.  - COVID vaccination status: declines.  - Flu vaccine: declined.  - Follow up in 1 year for annual exam.   Hildred Laser, MD [facility]

## 2020-10-27 NOTE — Progress Notes (Signed)
Pt present annual exam. Pt stated that she was doing well.

## 2020-10-27 NOTE — Patient Instructions (Signed)
Health Maintenance, Female Adopting a healthy lifestyle and getting preventive care are important in promoting health and wellness. Ask your health care provider about:  The right schedule for you to have regular tests and exams.  Things you can do on your own to prevent diseases and keep yourself healthy. What should I know about diet, weight, and exercise? Eat a healthy diet  Eat a diet that includes plenty of vegetables, fruits, low-fat dairy products, and lean protein.  Do not eat a lot of foods that are high in solid fats, added sugars, or sodium.   Maintain a healthy weight Body mass index (BMI) is used to identify weight problems. It estimates body fat based on height and weight. Your health care provider can help determine your BMI and help you achieve or maintain a healthy weight. Get regular exercise Get regular exercise. This is one of the most important things you can do for your health. Most adults should:  Exercise for at least 150 minutes each week. The exercise should increase your heart rate and make you sweat (moderate-intensity exercise).  Do strengthening exercises at least twice a week. This is in addition to the moderate-intensity exercise.  Spend less time sitting. Even light physical activity can be beneficial. Watch cholesterol and blood lipids Have your blood tested for lipids and cholesterol at 35 years of age, then have this test every 5 years. Have your cholesterol levels checked more often if:  Your lipid or cholesterol levels are high.  You are older than 35 years of age.  You are at high risk for heart disease. What should I know about cancer screening? Depending on your health history and family history, you may need to have cancer screening at various ages. This may include screening for:  Breast cancer.  Cervical cancer.  Colorectal cancer.  Skin cancer.  Lung cancer. What should I know about heart disease, diabetes, and high blood  pressure? Blood pressure and heart disease  High blood pressure causes heart disease and increases the risk of stroke. This is more likely to develop in people who have high blood pressure readings, are of African descent, or are overweight.  Have your blood pressure checked: ? Every 3-5 years if you are 18-39 years of age. ? Every year if you are 40 years old or older. Diabetes Have regular diabetes screenings. This checks your fasting blood sugar level. Have the screening done:  Once every three years after age 40 if you are at a normal weight and have a low risk for diabetes.  More often and at a younger age if you are overweight or have a high risk for diabetes. What should I know about preventing infection? Hepatitis B If you have a higher risk for hepatitis B, you should be screened for this virus. Talk with your health care provider to find out if you are at risk for hepatitis B infection. Hepatitis C Testing is recommended for:  Everyone born from 1945 through 1965.  Anyone with known risk factors for hepatitis C. Sexually transmitted infections (STIs)  Get screened for STIs, including gonorrhea and chlamydia, if: ? You are sexually active and are younger than 35 years of age. ? You are older than 35 years of age and your health care provider tells you that you are at risk for this type of infection. ? Your sexual activity has changed since you were last screened, and you are at increased risk for chlamydia or gonorrhea. Ask your health care provider   if you are at risk.  Ask your health care provider about whether you are at high risk for HIV. Your health care provider may recommend a prescription medicine to help prevent HIV infection. If you choose to take medicine to prevent HIV, you should first get tested for HIV. You should then be tested every 3 months for as long as you are taking the medicine. Pregnancy  If you are about to stop having your period (premenopausal) and  you may become pregnant, seek counseling before you get pregnant.  Take 400 to 800 micrograms (mcg) of folic acid every day if you become pregnant.  Ask for birth control (contraception) if you want to prevent pregnancy. Osteoporosis and menopause Osteoporosis is a disease in which the bones lose minerals and strength with aging. This can result in bone fractures. If you are 65 years old or older, or if you are at risk for osteoporosis and fractures, ask your health care provider if you should:  Be screened for bone loss.  Take a calcium or vitamin D supplement to lower your risk of fractures.  Be given hormone replacement therapy (HRT) to treat symptoms of menopause. Follow these instructions at home: Lifestyle  Do not use any products that contain nicotine or tobacco, such as cigarettes, e-cigarettes, and chewing tobacco. If you need help quitting, ask your health care provider.  Do not use street drugs.  Do not share needles.  Ask your health care provider for help if you need support or information about quitting drugs. Alcohol use  Do not drink alcohol if: ? Your health care provider tells you not to drink. ? You are pregnant, may be pregnant, or are planning to become pregnant.  If you drink alcohol: ? Limit how much you use to 0-1 drink a day. ? Limit intake if you are breastfeeding.  Be aware of how much alcohol is in your drink. In the [place] one drink equals one 12 oz bottle of beer (355 mL), one 5 oz glass of wine (148 mL), or one 1 oz glass of hard liquor (44 mL). General instructions  Schedule regular health, dental, and eye exams.  Stay current with your vaccines.  Tell your health care provider if: ? You often feel depressed. ? You have ever been abused or do not feel safe at home. Summary  Adopting a healthy lifestyle and getting preventive care are important in promoting health and wellness.  Follow your health care provider's instructions about healthy  diet, exercising, and getting tested or screened for diseases.  Follow your health care provider's instructions on monitoring your cholesterol and blood pressure. This information is not intended to replace advice given to you by your health care provider. Make sure you discuss any questions you have with your health care provider. Document Revised: 08/01/2018 Document Reviewed: 08/01/2018 Elsevier Patient Education  2021 Elsevier Inc.    Breast Self-Awareness Breast self-awareness means being familiar with how your breasts look and feel. It involves checking your breasts regularly and reporting any changes to your health care provider. Practicing breast self-awareness is important. Sometimes changes may not be harmful (are benign), but sometimes a change in your breasts can be a sign of a serious medical problem. It is important to learn how to do this procedure correctly so that you can catch problems early, when treatment is more likely to be successful. All women should practice breast self-awareness, including women who have had breast implants. What you need:  A mirror.  A   well-lit room. How to do a breast self-exam A breast self-exam is one way to learn what is normal for your breasts and whether your breasts are changing. To do a breast self-exam: Look for changes 1. Remove all the clothing above your waist. 2. Stand in front of a mirror in a room with good lighting. 3. Put your hands on your hips. 4. Push your hands firmly downward. 5. Compare your breasts in the mirror. Look for differences between them (asymmetry), such as: ? Differences in shape. ? Differences in size. ? Puckers, dips, and bumps in one breast and not the other. 6. Look at each breast for changes in the skin, such as: ? Redness. ? Scaly areas. 7. Look for changes in your nipples, such as: ? Discharge. ? Bleeding. ? Dimpling. ? Redness. ? A change in position.   Feel for changes Carefully feel your  breasts for lumps and changes. It is best to do this while lying on your back on the floor, and again while sitting or standing in the tub or shower with soapy water on your skin. Feel each breast in the following way: 1. Place the arm on the side of the breast you are examining above your head. 2. Feel your breast with the other hand. 3. Start in the nipple area and make -inch (2 cm) overlapping circles to feel your breast. Use the pads of your three middle fingers to do this. Apply light pressure, then medium pressure, then firm pressure. The light pressure will allow you to feel the tissue closest to the skin. The medium pressure will allow you to feel the tissue that is a little deeper. The firm pressure will allow you to feel the tissue close to the ribs. 4. Continue the overlapping circles, moving downward over the breast until you feel your ribs below your breast. 5. Move one finger-width toward the center of the body. Continue to use the -inch (2 cm) overlapping circles to feel your breast as you move slowly up toward your collarbone. 6. Continue the up-and-down exam using all three pressures until you reach your armpit.   Write down what you find Writing down what you find can help you remember what to discuss with your health care provider. Write down:  What is normal for each breast.  Any changes that you find in each breast, including: ? The kind of changes you find. ? Any pain or tenderness. ? Size and location of any lumps.  Where you are in your menstrual cycle, if you are still menstruating. General tips and recommendations  Examine your breasts every month.  If you are breastfeeding, the best time to examine your breasts is after a feeding or after using a breast pump.  If you menstruate, the best time to examine your breasts is 5-7 days after your period. Breasts are generally lumpier during menstrual periods, and it may be more difficult to notice changes.  With time  and practice, you will become more familiar with the variations in your breasts and more comfortable with the exam. Contact a health care provider if you:  See a change in the shape or size of your breasts or nipples.  See a change in the skin of your breast or nipples, such as a reddened or scaly area.  Have unusual discharge from your nipples.  Find a lump or thick area that was not there before.  Have pain in your breasts.  Have any concerns related to your breast health.   Summary  Breast self-awareness includes looking for physical changes in your breasts, as well as feeling for any changes within your breasts.  Breast self-awareness should be performed in front of a mirror in a well-lit room.  You should examine your breasts every month. If you menstruate, the best time to examine your breasts is 5-7 days after your menstrual period.  Let your health care provider know of any changes you notice in your breasts, including changes in size, changes on the skin, pain or tenderness, or unusual fluid from your nipples. This information is not intended to replace advice given to you by your health care provider. Make sure you discuss any questions you have with your health care provider. Document Revised: 03/27/2018 Document Reviewed: 03/27/2018 Elsevier Patient Education  2021 [employer] Inc.   

## 2020-10-28 LAB — COMPREHENSIVE METABOLIC PANEL
ALT: 10 IU/L (ref 0–32)
AST: 16 IU/L (ref 0–40)
Albumin/Globulin Ratio: 1.5 (ref 1.2–2.2)
Albumin: 4.1 g/dL (ref 3.8–4.8)
Alkaline Phosphatase: 80 IU/L (ref 44–121)
BUN/Creatinine Ratio: 11 (ref 9–23)
BUN: 9 mg/dL (ref 6–20)
Bilirubin Total: 0.3 mg/dL (ref 0.0–1.2)
CO2: 19 mmol/L — ABNORMAL LOW (ref 20–29)
Calcium: 9 mg/dL (ref 8.7–10.2)
Chloride: 104 mmol/L (ref 96–106)
Creatinine, Ser: 0.85 mg/dL (ref 0.57–1.00)
Globulin, Total: 2.7 g/dL (ref 1.5–4.5)
Glucose: 81 mg/dL (ref 65–99)
Potassium: 3.9 mmol/L (ref 3.5–5.2)
Sodium: 137 mmol/L (ref 134–144)
Total Protein: 6.8 g/dL (ref 6.0–8.5)
eGFR: 92 mL/min/{1.73_m2} (ref >59)

## 2020-10-28 LAB — CBC
Hematocrit: 37.2 % (ref 34.0–46.6)
Hemoglobin: 11.8 g/dL (ref 11.1–15.9)
MCH: 26.5 pg — ABNORMAL LOW (ref 26.6–33.0)
MCHC: 31.7 g/dL (ref 31.5–35.7)
MCV: 83 fL (ref 79–97)
Platelets: 318 10*3/uL (ref 150–450)
RBC: 4.46 x10E6/uL (ref 3.77–5.28)
RDW: 13.9 % (ref 11.7–15.4)
WBC: 6.3 10*3/uL (ref 3.4–10.8)

## 2020-10-28 LAB — LIPID PANEL
Chol/HDL Ratio: 3.2 ratio (ref 0.0–4.4)
Cholesterol, Total: 182 mg/dL (ref 100–199)
HDL: 57 mg/dL (ref >39)
LDL Chol Calc (NIH): 109 mg/dL — ABNORMAL HIGH (ref 0–99)
Triglycerides: 86 mg/dL (ref 0–149)
VLDL Cholesterol Cal: 16 mg/dL (ref 5–40)

## 2020-10-28 LAB — VITAMIN D 25 HYDROXY (VIT D DEFICIENCY, FRACTURES): Vit D, 25-Hydroxy: 28.8 ng/mL — ABNORMAL LOW (ref 30.0–100.0)

## 2020-10-28 LAB — HEMOGLOBIN A1C
Est. average glucose Bld gHb Est-mCnc: 105 mg/dL
Hgb A1c MFr Bld: 5.3 % (ref 4.8–5.6)

## 2020-10-28 LAB — TSH: TSH: 0.617 u[IU]/mL (ref 0.450–4.500)

## 2020-10-30 ENCOUNTER — Ambulatory Visit: Payer: Medicaid Other

## 2020-10-30 ENCOUNTER — Other Ambulatory Visit: Payer: Self-pay

## 2020-10-30 DIAGNOSIS — R293 Abnormal posture: Secondary | ICD-10-CM

## 2020-10-30 DIAGNOSIS — M62838 Other muscle spasm: Secondary | ICD-10-CM

## 2020-10-30 DIAGNOSIS — M4125 Other idiopathic scoliosis, thoracolumbar region: Secondary | ICD-10-CM

## 2020-10-30 LAB — CYTOLOGY - PAP
Comment: NEGATIVE
Diagnosis: NEGATIVE
High risk HPV: NEGATIVE

## 2020-10-30 NOTE — Therapy (Signed)
[facility] [facility] MAIN [facility] SERVICES [address] [place], [place], [address] Phone: [phone]   Fax:  [phone]  Physical Therapy Treatment  The patient has been informed of current processes in place at Outpatient Rehab to protect patients from Covid-19 exposure including social distancing, schedule modifications, and new cleaning procedures. After discussing their particular risk with a therapist based on the patient's personal risk factors, the patient has decided to proceed with in-person therapy.   Patient Details  Name: Madison Randall MRN: [id number] Date of Birth: 07/16/1986 No data recorded  Encounter Date: 10/30/2020   PT End of Session - 10/30/20 1009    Visit Number 4    Number of Visits 10    Date for PT Re-Evaluation 01/15/20    Authorization Type MCAID    Authorization Time Period 09/15/20 through 11/24/20    Authorization - Visit Number 4    Authorization - Number of Visits 10    Progress Note Due on Visit 10    PT Start Time 0830    PT Stop Time 0930    PT Time Calculation (min) 60 min    Activity Tolerance Patient tolerated treatment well;No increased pain    Behavior During Therapy WFL for tasks assessed/performed           Past Medical History:  Diagnosis Date  . Amenorrhea   . Migraine   . Migraines     Past Surgical History:  Procedure Laterality Date  . DILATION AND CURETTAGE OF UTERUS  09/2014 uterine infection form delivery 2015    There were no vitals filed for this visit.    Pelvic Floor Physical Therapy Treatment Note  SCREENING  Changes in medications, allergies, or medical history?: none    SUBJECTIVE  Patient reports: Her hip hurts again, she has been walking and when she starts to want to start "pulling it" she starts to do a high-stepping motion for a while. She tried to play soccer with her son for a while and it stopped hurting but started feeling "hot". She has walked 45 min to 1 hr  each day since her last visit. Her hip started hurting the day after her last visit during her walk.   Precautions:  18 months PP  Pain update:  Sexual activity/pain: Having pain with intercourse.  Location of pain: L>R hip Current pain:  8/10  Max pain:  8/10 Least pain:  5/10 Nature of pain: Ache  **following treatment Pt. Demonstrated no pain  Patient Goals: Get back to living life without fear of activity and POP pressure, decrease pain with intercourse and in R hip.   OBJECTIVE  Changes in: Posture/Observations:  3/4: R anterior innominate rotation. L hip slightly elevated. Hip-scouring test: positive for labral tear on R  TODAY: Pt. 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  Range of Motion/Flexibilty:    Strength/MMT:  LE MMT:  Pelvic floor:  Abdominal:   Palpation: TTP to R TFL, sartorius, and iliacus   Gait Analysis:  INTERVENTIONS THIS SESSION:  Manual: Performed TP release and STM toTFL, sartorius, and iliacus to decrease spasm and pain and allow for improved balance of musculature for improved function and decreased symptoms.  Dry-needle: Performed TPDN with a .30x60, a .30x75, and a .30x193mm needle and standard approach as described below to decrease spasm and pain and allow for improved balance of musculature for improved function and decreased symptoms.  Therex: reviewed and practiced butterfly and hip-flexor  stretches as well as how to engage glutes with walking and standing hip EXT to take pressure from the inguinal canal and allow for decreased nerve compression and pain.   Total time: 60 min.                     Trigger Point Dry Needling - 10/30/20 0001    Consent Given? Yes    Education Handout Provided No    Muscles Treated Lower Quadrant Adductor longus/brevis/magnus    Muscles Treated Back/Hip Tensor fascia lata    Dry Needling Comments Right    Other Dry  Needling Sartorius with twitch and lengthening    Adductor Response Twitch response elicited;Palpable increased muscle length    Tensor Fascia Lata Response Twitch response elicited;Palpable increased muscle length                  PT Short Term Goals - 09/17/20 0746      PT SHORT TERM GOAL #1   Title Patient will demonstrate improved pelvic alignment and balance of musculature surrounding the pelvis to facilitate decreased PFM spasms and decrease pelvic pain.    Baseline L up-slip, R anterior rotation, R thoracic scoliosis, flat-back posture.    Time 5    Period Weeks    Status New    Target Date 10/20/20      PT SHORT TERM GOAL #2   Title Patient will demonstrate a coordinated contraction, relaxation, and bulge of the pelvic floor muscles to demonstrate functional recruitment and motion and allow for further strengthening.    Baseline Pt. having PFM dysfuncton evidenced by POP Sx. and dyspareunia    Time 5    Period Weeks    Status New    Target Date 10/20/20      PT SHORT TERM GOAL #3   Title Patient will demonstrate improved sitting and standing posture to demonstrate learning and decrease stress on the pelvic floor with functional activity.    Baseline posterior pelvic tilt    Time 5    Period Weeks    Status New    Target Date 10/20/20      PT SHORT TERM GOAL #4   Title Patient will demonstrate a coordinated contraction, relaxation, and bulge of the pelvic floor muscles to demonstrate functional recruitment and motion and allow for further strengthening.    Baseline Pt. demonstrates decreased recruitment and coordination of the PFM evidenced by her POP symptoms.    Time 5    Period Weeks    Status New    Target Date 10/20/20             PT Long Term Goals - 09/17/20 0748      PT LONG TERM GOAL #1   Title Patient will describe feeling of pressure no more than 5% of the time over the course of the past week to demonstrate improved recruitment and strength  of the pelvic floor.    Baseline Pt. having increased POP sensation with walkingsweeping, vacuuming, sneezing etc.    Time 10    Period Weeks    Status New    Target Date 11/25/19      PT LONG TERM GOAL #2   Title Pt. will improve in FOTO score by 20 points in each category to demonstrate improved function.    Baseline FOTO PFDI  Prolapse:58  PFDI  Pain:58    Time 10    Status New    Target Date 11/24/20  PT LONG TERM GOAL #3   Title Patient will report no episodes of SUI over the course of the prior two weeks to demonstrate improved functional ability.    Baseline Having mild SUI with cough, sneeze, etc.    Time 10    Period Weeks    Status New    Target Date 11/24/20      PT LONG TERM GOAL #4   Title Patient will report no pain with intercourse to demonstrate improved functional ability.    Baseline Pt. having pain great enough to keep her from participating in intercourse.    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    Baseline L>R hip weakness, likely contributing to pelvic instability that allowed for Sx. to reccur.    Time 10    Period Weeks    Status New    Target Date 11/24/20                 Plan - 10/30/20 1009    Clinical Impression Statement Pt. Responded well to all interventions today, demonstrating decreased spasm and resolution of pain as well as understanding and correct performance of all education and exercises provided today. 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 She will continue to benefit from skilled physical therapy to work toward remaining goals and maximize function as well as decrease likelihood of symptom increase or recurrence.    Personal Factors and Comorbidities Comorbidity 3+    Comorbidities Migraines, Plantar Fasciitis, Scoliosis    Examination-Activity Limitations Sit;Sleep;Lift;Squat;Bend;Caring for Others;Locomotion Level;Stand;Transfers;Toileting    Examination-Participation Restrictions Interpersonal Relationship;Community Activity;Shop;Driving;Meal Prep    Stability/Clinical Decision Making Evolving/Moderate complexity    Rehab Potential Good    PT Frequency 1x / week    PT Duration Other (comment)   10 weeks   PT Treatment/Interventions ADLs/Self Care Home Management;Biofeedback;Electrical Stimulation;Moist Heat;Traction;Gait training;Functional mobility Network engineertraining;Stair training;Therapeutic activities;Therapeutic exercise;Balance training;Neuromuscular re-education;Patient/family education;Manual techniques;Scar mobilization;Passive range of motion;Taping;Dry needling;Visual/perceptual remediation/compensation;Spinal Manipulations;Joint Manipulations    PT Next Visit Plan Review deep-core/ expand and review planks and boat pose progression and add glute max/med in standing, 3-way wall stretches,assess PFM and teach self internal TP release.    PT Home Exercise Plan R rib shift correction; diaphgramatic breathing; R QL stretch; log rolling and STS edu; side plank; tennis ball TP release; Prone hip-flexor stretch/hold-release; seated and hooklying chin tucks; bridges; given info on internal release and self infernal release materials, pre-squeeze and sneeze, chest stretch, scapular retraction/depression, seated and supine pelvic tilts with/without pillow, rotator cuff program, SCM stretch. SLS balance. NEW: pelvic tilts in supine, hip-flexor stretch, side-stretch.    Consulted and Agree with Plan of Care Patient           Patient will benefit from skilled therapeutic intervention in order to improve the following deficits and impairments:  Decreased balance,Decreased endurance,Difficulty walking,Increased muscle  spasms,Improper body mechanics,Decreased activity tolerance,Decreased coordination,Decreased strength,Increased fascial restricitons,Postural dysfunction,Pain  Visit Diagnosis: Other muscle spasm  Abnormal posture  Other idiopathic scoliosis, thoracolumbar region     Problem List Patient Active Problem List   Diagnosis Date Noted  . Female cystocele 08/21/2020  . Pelvic floor dysfunction 08/21/2020  .  Sty, external 05/14/2019  . Plantar fasciitis, bilateral 07/19/2017  . Sebaceous cyst of right axilla 07/19/2017  . Overweight (BMI 25.0-29.9) 04/28/2016  . Migraines 04/04/2016   Cleophus Molt DPT, ATC Cleophus Molt 10/30/2020, 10:11 AM  [place] [facility] MAIN [facility] SERVICES [address] [place], [place], [address] Phone: [phone]   Fax:  [phone]  Name: Madison Randall MRN: [id number] Date of Birth: Jan 09, 1986

## 2020-11-06 ENCOUNTER — Other Ambulatory Visit: Payer: Self-pay

## 2020-11-06 ENCOUNTER — Ambulatory Visit: Payer: Medicaid Other

## 2020-11-06 DIAGNOSIS — M4125 Other idiopathic scoliosis, thoracolumbar region: Secondary | ICD-10-CM

## 2020-11-06 DIAGNOSIS — R293 Abnormal posture: Secondary | ICD-10-CM

## 2020-11-06 DIAGNOSIS — M62838 Other muscle spasm: Secondary | ICD-10-CM

## 2020-11-06 NOTE — Therapy (Signed)
[place] [facility] MAIN [facility] SERVICES [address] [place], [place], [address] Phone: [phone]   Fax:  [phone]  Physical Therapy Treatment  The patient has been informed of current processes in place at Outpatient Rehab to protect patients from Covid-19 exposure including social distancing, schedule modifications, and new cleaning procedures. After discussing their particular risk with a therapist based on the patient's personal risk factors, the patient has decided to proceed with in-person therapy.  Patient Details  Name: Madison Randall MRN: [id number] Date of Birth: 1986/01/22 No data recorded  Encounter Date: 11/06/2020   PT End of Session - 11/06/20 0949    Visit Number 5    Number of Visits 10    Date for PT Re-Evaluation 01/15/20    Authorization Type MCAID    Authorization Time Period 09/15/20 through 11/24/20    Authorization - Visit Number 5    Authorization - Number of Visits 10    Progress Note Due on Visit 10    PT Start Time 0835    PT Stop Time 0935    PT Time Calculation (min) 60 min    Activity Tolerance Patient tolerated treatment well;No increased pain    Behavior During Therapy WFL for tasks assessed/performed           Past Medical History:  Diagnosis Date  . Amenorrhea   . Migraine   . Migraines     Past Surgical History:  Procedure Laterality Date  . DILATION AND CURETTAGE OF UTERUS  09/2014 uterine infection form delivery 2015    There were no vitals filed for this visit.  Pelvic Floor Physical Therapy Treatment Note  SCREENING  Changes in medications, allergies, or medical history?: none    SUBJECTIVE  Patient reports: Her hip feels better than it has in 4 years other than when she had an incident where she bent deep and heard a "pop" that caused a warm pain in the mid thigh up and it was 9/10 pain. After heat and rest it was down to 4/10. She has been walking every day and now only has some  tenderness in the lower anterior thigh on the R. Her feet have started going numb when she is walking. She has tried wearing different shoes. It comes on after ~ 10 min. And when she is done it takes ~ !0 min. For the feeling to come back.    Precautions:  18 months PP  Pain update:  Sexual activity/pain: Having pain with intercourse.  Location of pain: L>R hip Current pain:  3/10  Max pain:  9/10 Least pain:  3/10 Nature of pain: Ache  **following treatment Pt. Demonstrated no pain  Patient Goals: Get back to living life without fear of activity and POP pressure, decrease pain with intercourse and in R hip.   OBJECTIVE  Changes in: Posture/Observations:  3/4: R anterior innominate rotation. L hip slightly elevated. Hip-scouring test: positive for labral tear on R  10/30/20: Pt. 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  TODAY: hyperkyphosis/lordosis with genu recurvatum  Range of Motion/Flexibilty:    Strength/MMT:  LE MMT:  Pelvic floor:  Abdominal:   Palpation: TTP to R TFL, iliopsoas, vastus lateralis and rectus femoris  Gait Analysis: Pt heel-strikes firmly and has an excessive arch in her low back with poor postural muscle engagment.  INTERVENTIONS THIS SESSION:  Manual: Performed TP release and STM to R TFL, iliopsoas, vastus lateralis and rectus femoris to  decrease spasm and pain and allow for improved balance of musculature for improved function and decreased symptoms.  Therex: Educated on and demonstrated stretches for calf, HS, hip-flexors, and LB with emphasis on the importance of doing them after her walks.  Gait training: Assessed and educated on gait mechanics with emphasis on drawing up tall through the postural muscles and not impacting the ground as hard as well as not allowing the lumbar spine to excessively arch to prevent return of spasm/pain and prevent her feet going numb when  walking.   Total time: 60 min.                               PT Short Term Goals - 09/17/20 0746      PT SHORT TERM GOAL #1   Title Patient will demonstrate improved pelvic alignment and balance of musculature surrounding the pelvis to facilitate decreased PFM spasms and decrease pelvic pain.    Baseline L up-slip, R anterior rotation, R thoracic scoliosis, flat-back posture.    Time 5    Period Weeks    Status New    Target Date 10/20/20      PT SHORT TERM GOAL #2   Title Patient will demonstrate a coordinated contraction, relaxation, and bulge of the pelvic floor muscles to demonstrate functional recruitment and motion and allow for further strengthening.    Baseline Pt. having PFM dysfuncton evidenced by POP Sx. and dyspareunia    Time 5    Period Weeks    Status New    Target Date 10/20/20      PT SHORT TERM GOAL #3   Title Patient will demonstrate improved sitting and standing posture to demonstrate learning and decrease stress on the pelvic floor with functional activity.    Baseline posterior pelvic tilt    Time 5    Period Weeks    Status New    Target Date 10/20/20      PT SHORT TERM GOAL #4   Title Patient will demonstrate a coordinated contraction, relaxation, and bulge of the pelvic floor muscles to demonstrate functional recruitment and motion and allow for further strengthening.    Baseline Pt. demonstrates decreased recruitment and coordination of the PFM evidenced by her POP symptoms.    Time 5    Period Weeks    Status New    Target Date 10/20/20             PT Long Term Goals - 09/17/20 0748      PT LONG TERM GOAL #1   Title Patient will describe feeling of pressure no more than 5% of the time over the course of the past week to demonstrate improved recruitment and strength of the pelvic floor.    Baseline Pt. having increased POP sensation with walkingsweeping, vacuuming, sneezing etc.    Time 10    Period Weeks     Status New    Target Date 11/25/19      PT LONG TERM GOAL #2   Title Pt. will improve in FOTO score by 20 points in each category to demonstrate improved function.    Baseline FOTO PFDI  Prolapse:58  PFDI  Pain:58    Time 10    Status New    Target Date 11/24/20      PT LONG TERM GOAL #3   Title Patient will report no episodes of SUI over the course of the prior two weeks  to demonstrate improved functional ability.    Baseline Having mild SUI with cough, sneeze, etc.    Time 10    Period Weeks    Status New    Target Date 11/24/20      PT LONG TERM GOAL #4   Title Patient will report no pain with intercourse to demonstrate improved functional ability.    Baseline Pt. having pain great enough to keep her from participating in intercourse.    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    Baseline L>R hip weakness, likely contributing to pelvic instability that allowed for Sx. to reccur.    Time 10    Period Weeks    Status New    Target Date 11/24/20                 Plan - 11/06/20 0950    Clinical Impression Statement Pt. Responded well to all interventions today, demonstrating decreased spasm and resolution of pain, improved posture in standing and with walking, as well as understanding and correct performance of all education and exercises provided today. They will continue to benefit from skilled physical therapy to work toward remaining goals and maximize function as well as decrease likelihood of symptom increase or recurrence.    Personal Factors and Comorbidities Comorbidity 3+    Comorbidities Migraines, Plantar Fasciitis, Scoliosis    Examination-Activity Limitations Sit;Sleep;Lift;Squat;Bend;Caring for Others;Locomotion  Level;Stand;Transfers;Toileting    Examination-Participation Restrictions Interpersonal Relationship;Community Activity;Shop;Driving;Meal Prep    Stability/Clinical Decision Making Evolving/Moderate complexity    Rehab Potential Good    PT Frequency 1x / week    PT Duration Other (comment)   10 weeks   PT Treatment/Interventions ADLs/Self Care Home Management;Biofeedback;Electrical Stimulation;Moist Heat;Traction;Gait training;Functional mobility Network engineer;Therapeutic activities;Therapeutic exercise;Balance training;Neuromuscular re-education;Patient/family education;Manual techniques;Scar mobilization;Passive range of motion;Taping;Dry needling;Visual/perceptual remediation/compensation;Spinal Manipulations;Joint Manipulations    PT Next Visit Plan Educate on foot strengthening and release for Plantar fasciitis, Review deep-core/ expand and review planks and boat pose progression and add glute max/med in standing, 3-way wall stretches,assess PFM and teach self internal TP release PRN.    PT Home Exercise Plan R rib shift correction; diaphgramatic breathing; R QL stretch; log rolling and STS edu; side plank; tennis ball TP release; Prone hip-flexor stretch/hold-release; seated and hooklying chin tucks; bridges; given info on internal release and self infernal release materials, pre-squeeze and sneeze, chest stretch, scapular retraction/depression, seated and supine pelvic tilts with/without pillow, rotator cuff program, SCM stretch. SLS balance. NEW: pelvic tilts in supine, hip-flexor stretch, side-stretch, posture, walking mechanics, calf stretch, HS stretch, LB stretch, arch supports.    Consulted and Agree with Plan of Care Patient           Patient will benefit from skilled therapeutic intervention in order to improve the following deficits and impairments:  Decreased balance,Decreased endurance,Difficulty walking,Increased muscle spasms,Improper body mechanics,Decreased activity  tolerance,Decreased coordination,Decreased strength,Increased fascial restricitons,Postural dysfunction,Pain  Visit Diagnosis: Other muscle spasm  Abnormal posture  Other idiopathic scoliosis, thoracolumbar region     Problem List Patient Active Problem List   Diagnosis Date Noted  . Female cystocele 08/21/2020  . Pelvic floor dysfunction 08/21/2020  . Sty, external 05/14/2019  . Plantar fasciitis, bilateral 07/19/2017  . Sebaceous  cyst of right axilla 07/19/2017  . Overweight (BMI 25.0-29.9) 04/28/2016  . Migraines 04/04/2016   Cleophus MoltKeeli T. Skylan Lara DPT, ATC Cleophus MoltKeeli T Darionna Banke 11/06/2020, 9:53 AM  [place] [facility]IN [facility] [address] [place], [place], [address] Phone: [phone]   Fax:  [phone]  Name: Madison Randall MRN: [id number] Date of Birth: Sep 11, 1985

## 2020-11-06 NOTE — Patient Instructions (Signed)
  Make sure to stretch your calves, hip-flexors, hamstrings, and Low back following your daily walks and make sure that you walk "tall" and "glide" across the ground    Your "usual posture" looks like the kyphotic-lordotic picture above, we want it to look more like the first picture labeled "normal" Where the earlobe, tip of the shoulder, hip, and knee are all in a straight line.  Make sure that your knees are un-locked but not bent.  Draw up as if an imaginary string were tied to the crown of your head stretching you up tall. Gently pull shoulders back and down without "pushing" chest out. Pull chin in slightly as if you were a turtle pulling into your shell until ears line up with tips of shoulders.

## 2020-11-13 ENCOUNTER — Ambulatory Visit: Payer: Medicaid Other

## 2020-11-20 ENCOUNTER — Other Ambulatory Visit: Payer: Self-pay

## 2020-11-20 ENCOUNTER — Ambulatory Visit: Payer: Medicaid Other | Attending: Obstetrics and Gynecology

## 2020-11-20 DIAGNOSIS — M4125 Other idiopathic scoliosis, thoracolumbar region: Secondary | ICD-10-CM | POA: Insufficient documentation

## 2020-11-20 DIAGNOSIS — R293 Abnormal posture: Secondary | ICD-10-CM | POA: Insufficient documentation

## 2020-11-20 DIAGNOSIS — M62838 Other muscle spasm: Secondary | ICD-10-CM | POA: Diagnosis not present

## 2020-11-20 NOTE — Therapy (Signed)
[facility] MAIN [facility] SERVICES [address] [place], [place], [address] Phone: [phone]   Fax:  [phone]  Physical Therapy Treatment  The patient has been informed of current processes in place at Outpatient Rehab to protect patients from Covid-19 exposure including social distancing, schedule modifications, and new cleaning procedures. After discussing their particular risk with a therapist based on the patient's personal risk factors, the patient has decided to proceed with in-person therapy.  Patient Details  Name: Madison Randall MRN: [id number] Date of Birth: 04/03/1986 No data recorded  Encounter Date: 11/20/2020   PT End of Session - 11/20/20 0908    Visit Number 6    Number of Visits 10    Date for PT Re-Evaluation 01/15/20    Authorization Type [employer]ID    Authorization Time Period 09/15/20 through 11/24/20    Authorization - Visit Number 6    Authorization - Number of Visits 10    Progress Note Due on Visit 10    PT Start Time 0836    PT Stop Time 0936    PT Time Calculation (min) 60 min    Activity Tolerance Patient tolerated treatment well;No increased pain    Behavior During Therapy WFL for tasks assessed/performed           Past Medical History:  Diagnosis Date  . Amenorrhea   . Migraine   . Migraines     Past Surgical History:  Procedure Laterality Date  . DILATION AND CURETTAGE OF UTERUS  09/2014 uterine infection form delivery 2015    There were no vitals filed for this visit.   Pelvic Floor Physical Therapy Treatment Note  SCREENING  Changes in medications, allergies, or medical history?: none    SUBJECTIVE  Patient reports: She feels like she is doing the right way to walk when she walks at a "normal pace" but when she walks fast she still walks hard. She feels like she is standing right/catching herself ~ 90% of the time. She has some soreness laterally in the R hip and medially in her L knee. She is  "popping" when she tries to get in/out of bed.    Precautions:  20 months PP  Pain update:  Sexual activity/pain: Having pain with intercourse.  Location of pain: R lateral hip and L knee  Current pain:  5/10  Max pain:  9/10 Least pain:  0/10 Nature of pain: Ache  **following treatment Pt. Reports feeling "better"  Patient Goals: Get back to living life without fear of activity and POP pressure, decrease pain with intercourse and in R hip.   OBJECTIVE  Changes in: Posture/Observations:  3/4: R anterior innominate rotation. L hip slightly elevated. Hip-scouring test: positive for labral tear on R  10/30/20: Pt. 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  11/06/20: hyperkyphosis/lordosis with genu recurvatum  TODAY: Pt. Demonstrates understanding and correct use of posture modifications described last week.  Range of Motion/Flexibilty:   Decreased fascial mobility near R TFL and along L sartorius   Strength/MMT:  LE MMT:  Pelvic floor:  Abdominal:   Palpation: TTP to R OI and Piriformis  Gait Analysis: Pt heel-strikes firmly and has an excessive arch in her low back with poor postural muscle engagment.  INTERVENTIONS THIS SESSION:  Manual: Performed TP release and STM to R OI and Piriformis followed by static and dynamic cupping to R lateral hip and L anterior thigh to decrease spasm and pain and  allow for improved balance of musculature for improved function and decreased symptoms.  Therex: Educated on and practiced arch crunches and toe scrunches and educated on how to do self TP release with a tennis ball and bouncy ball to foot intrinsics to help decrease plantar fasciitis and allow for maximal gait and posture.  Performed TPDN with a .30x16mm needle and standard approach as described below to decrease spasm and pain and allow for improved balance of musculature for improved function and decreased  symptoms.   Total time: 60 min.                        Trigger Point Dry Needling - 11/20/20 0001    Consent Given? Yes    Education Handout Provided No    Muscles Treated Back/Hip Obturator internus;Piriformis    Dry Needling Comments Right    Piriformis Response Palpable increased muscle length    Obturator internus Response Twitch response elicited;Palpable increased muscle length                  PT Short Term Goals - 09/17/20 0746      PT SHORT TERM GOAL #1   Title Patient will demonstrate improved pelvic alignment and balance of musculature surrounding the pelvis to facilitate decreased PFM spasms and decrease pelvic pain.    Baseline L up-slip, R anterior rotation, R thoracic scoliosis, flat-back posture.    Time 5    Period Weeks    Status New    Target Date 10/20/20      PT SHORT TERM GOAL #2   Title Patient will demonstrate a coordinated contraction, relaxation, and bulge of the pelvic floor muscles to demonstrate functional recruitment and motion and allow for further strengthening.    Baseline Pt. having PFM dysfuncton evidenced by POP Sx. and dyspareunia    Time 5    Period Weeks    Status New    Target Date 10/20/20      PT SHORT TERM GOAL #3   Title Patient will demonstrate improved sitting and standing posture to demonstrate learning and decrease stress on the pelvic floor with functional activity.    Baseline posterior pelvic tilt    Time 5    Period Weeks    Status New    Target Date 10/20/20      PT SHORT TERM GOAL #4   Title Patient will demonstrate a coordinated contraction, relaxation, and bulge of the pelvic floor muscles to demonstrate functional recruitment and motion and allow for further strengthening.    Baseline Pt. demonstrates decreased recruitment and coordination of the PFM evidenced by her POP symptoms.    Time 5    Period Weeks    Status New    Target Date 10/20/20             PT Long Term Goals -  09/17/20 0748      PT LONG TERM GOAL #1   Title Patient will describe feeling of pressure no more than 5% of the time over the course of the past week to demonstrate improved recruitment and strength of the pelvic floor.    Baseline Pt. having increased POP sensation with walkingsweeping, vacuuming, sneezing etc.    Time 10    Period Weeks    Status New    Target Date 11/25/19      PT LONG TERM GOAL #2   Title Pt. will improve in FOTO score by 20 points in each category  to demonstrate improved function.    Baseline FOTO PFDI  Prolapse:58  PFDI  Pain:58    Time 10    Status New    Target Date 11/24/20      PT LONG TERM GOAL #3   Title Patient will report no episodes of SUI over the course of the prior two weeks to demonstrate improved functional ability.    Baseline Having mild SUI with cough, sneeze, etc.    Time 10    Period Weeks    Status New    Target Date 11/24/20      PT LONG TERM GOAL #4   Title Patient will report no pain with intercourse to demonstrate improved functional ability.    Baseline Pt. having pain great enough to keep her from participating in intercourse.    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    Baseline L>R hip weakness, likely contributing to pelvic instability that allowed for Sx. to reccur.    Time 10    Period Weeks    Status New    Target Date 11/24/20                 Plan - 11/20/20 0910    Clinical Impression Statement Pt. Responded well to all interventions today, demonstrating improved fascial mobility, decrerased TTP and spasm, as well as understanding and correct performance of all education and exercises provided today. They will continue to benefit from skilled physical therapy to work  toward remaining goals and maximize function as well as decrease likelihood of symptom increase or recurrence.    PT Next Visit Plan Educate on foot strengthening and release for Plantar fasciitis, Review deep-core/ expand and review planks and boat pose progression and add glute max/med in standing, 3-way wall stretches,assess PFM and teach self internal TP release PRN.    PT Home Exercise Plan R rib shift correction; diaphgramatic breathing; R QL stretch; log rolling and STS edu; side plank; tennis ball TP release; Prone hip-flexor stretch/hold-release; seated and hooklying chin tucks; bridges; given info on internal release and self infernal release materials, pre-squeeze and sneeze, chest stretch, scapular retraction/depression, seated and supine pelvic tilts with/without pillow, rotator cuff program, SCM stretch. SLS balance. NEW: pelvic tilts in supine, hip-flexor stretch, side-stretch, posture, walking mechanics, calf stretch, HS stretch, LB stretch, arch supports.    Consulted and Agree with Plan of Care Patient           Patient will benefit from skilled therapeutic intervention in order to improve the following deficits and impairments:     Visit Diagnosis: Other muscle spasm  Abnormal posture  Other idiopathic scoliosis, thoracolumbar region     Problem List Patient Active Problem List   Diagnosis Date Noted  . Female cystocele 08/21/2020  . Pelvic floor dysfunction 08/21/2020  . Sty, external 05/14/2019  . Plantar fasciitis, bilateral 07/19/2017  . Sebaceous cyst of right axilla 07/19/2017  . Overweight (BMI 25.0-29.9) 04/28/2016  . Migraines 04/04/2016   Cleophus MoltKeeli T. Celester Morgan DPT, ATC Cleophus MoltKeeli T Khamari Sheehan 11/20/2020, 9:25 AM  [facility] MAIN [facility] SERVICES [address] [place], [place], [address] Phone: [phone]   Fax:  [phone]  Name: Darnelle GoingKendra T  Heap MRN: [id number] Date of Birth: [birth year]

## 2020-11-20 NOTE — Patient Instructions (Addendum)
Toe "scrunches"  Do 2x10, 1 time per day  Arch "crunches"   Do 2x10, 1 time per day    Use your hands on a chair to gently work your knees down toward the floor.

## 2020-11-23 ENCOUNTER — Ambulatory Visit: Payer: Medicaid Other

## 2020-11-27 ENCOUNTER — Encounter: Payer: Self-pay | Admitting: Obstetrics and Gynecology

## 2020-11-27 ENCOUNTER — Ambulatory Visit (INDEPENDENT_AMBULATORY_CARE_PROVIDER_SITE_OTHER): Payer: Medicaid Other | Admitting: Obstetrics and Gynecology

## 2020-11-27 ENCOUNTER — Ambulatory Visit: Payer: Medicaid Other

## 2020-11-27 ENCOUNTER — Other Ambulatory Visit: Payer: Self-pay

## 2020-11-27 VITALS — BP 117/87 | HR 98 | Ht 63.0 in | Wt 169.4 lb

## 2020-11-27 DIAGNOSIS — R638 Other symptoms and signs concerning food and fluid intake: Secondary | ICD-10-CM | POA: Diagnosis not present

## 2020-11-27 DIAGNOSIS — Z7689 Persons encountering health services in other specified circumstances: Secondary | ICD-10-CM

## 2020-11-27 DIAGNOSIS — E669 Obesity, unspecified: Secondary | ICD-10-CM

## 2020-11-27 MED ORDER — PHENTERMINE HCL 30 MG PO CAPS: 30.0000 mg | ORAL_CAPSULE | ORAL | 1 refills | Status: DC

## 2020-11-27 NOTE — Addendum Note (Signed)
Addended by: Fabian November on: 11/27/2020 10:52 AM   Modules accepted: Orders

## 2020-11-27 NOTE — Patient Instructions (Signed)
Preventing Unhealthy Weight Gain, Adult Staying at a healthy weight is important to your overall health. When fat builds up in your body, you may become overweight or obese. Being overweight or obese increases your risk of developing certain health problems, such as heart disease, diabetes, sleeping problems, joint problems, and some types of cancer. Unhealthy weight gain is often the result of making unhealthy food choices or not getting enough exercise. You can make changes to your lifestyle to prevent obesity and stay as healthy as possible. What nutrition changes can be made?  Eat only as much as your body needs. To do this: ? Pay attention to signs that you are hungry or full. Stop eating as soon as you feel full. ? If you feel hungry, try drinking water first before eating. Drink enough water so your urine is clear or pale yellow. ? Eat smaller portions. Pay attention to portion sizes when eating out. ? Look at serving sizes on food labels. Most foods contain more than one serving per container. ? Eat the recommended number of calories for your gender and activity level. For most active people, a daily total of 2,000 calories is appropriate. If you are trying to lose weight or are not very active, you may need to eat fewer calories. Talk with your health care provider or a diet and nutrition specialist (dietitian) about how many calories you need each day.  Choose healthy foods, such as: ? Fruits and vegetables. At each meal, try to fill at least half of your plate with fruits and vegetables. ? Whole grains, such as whole-wheat bread, brown rice, and quinoa. ? Lean meats, such as chicken or fish. ? Other healthy proteins, such as beans, eggs, or tofu. ? Healthy fats, such as nuts, seeds, fatty fish, and olive oil. ? Low-fat or fat-free dairy products.  Check food labels, and avoid food and drinks that: ? Are high in calories. ? Have added sugar. ? Are high in sodium. ? Have saturated  fats or trans fats.  Cook foods in healthier ways, such as by baking, broiling, or grilling.  Make a meal plan for the week, and shop with a grocery list to help you stay on track with your purchases. Try to avoid going to the grocery store when you are hungry.  When grocery shopping, try to shop around the outside of the store first, where the fresh foods are. Doing this helps you to avoid prepackaged foods, which can be high in sugar, salt (sodium), and fat.   What lifestyle changes can be made?  Exercise for 30 or more minutes on 5 or more days each week. Exercising may include brisk walking, yard work, biking, running, swimming, and team sports like basketball and soccer. Ask your health care provider which exercises are safe for you.  Do muscle-strengthening activities, such as lifting weights or using resistance bands, on 2 or more days a week.  Do not use any products that contain nicotine or tobacco, such as cigarettes and e-cigarettes. If you need help quitting, ask your health care provider.  Limit alcohol intake to no more than 1 drink a day for nonpregnant women and 2 drinks a day for men. One drink equals 12 oz of beer, 5 oz of wine, or 1 oz of hard liquor.  Try to get 7-9 hours of sleep each night.   What other changes can be made?  Keep a food and activity journal to keep track of: ? What you ate and how   many calories you had. Remember to count the calories in sauces, dressings, and side dishes. ? Whether you were active, and what exercises you did. ? Your calorie, weight, and activity goals.  Check your weight regularly. Track any changes. If you notice you have gained weight, make changes to your diet or activity routine.  Avoid taking weight-loss medicines or supplements. Talk to your health care provider before starting any new medicine or supplement.  Talk to your health care provider before trying any new diet or exercise plan. Why are these changes  important? Eating healthy, staying active, and having healthy habits can help you to prevent obesity. Those changes also:  Help you manage stress and emotions.  Help you connect with friends and family.  Improve your self-esteem.  Improve your sleep.  Prevent long-term health problems. What can happen if changes are not made? Being obese or overweight can cause you to develop joint or bone problems, which can make it hard for you to stay active or do activities you enjoy. Being obese or overweight also puts stress on your heart and lungs and can lead to health problems like diabetes, heart disease, and some cancers. Where to find more information Talk with your health care provider or a dietitian about healthy eating and healthy lifestyle choices. You may also find information from:  U.S. Department of Agriculture, MyPlate: www.choosemyplate.gov  American Heart Association: www.heart.org  Centers for Disease Control and Prevention: www.cdc.gov Summary  Staying at a healthy weight is important to your overall health. It helps you to prevent certain diseases and health problems, such as heart disease, diabetes, joint problems, sleep disorders, and some types of cancer.  Being obese or overweight can cause you to develop joint or bone problems, which can make it hard for you to stay active or do activities you enjoy.  You can prevent unhealthy weight gain by eating a healthy diet, exercising regularly, not smoking, limiting alcohol, and getting enough sleep.  Talk with your health care provider or a dietitian for guidance about healthy eating and healthy lifestyle choices. This information is not intended to replace advice given to you by your health care provider. Make sure you discuss any questions you have with your health care provider. Document Revised: 12/05/2019 Document Reviewed: 12/05/2019 Elsevier Patient Education  2021 Elsevier Inc.  

## 2020-11-27 NOTE — Progress Notes (Signed)
GYN-Pt present for weight management. Pt started medication phenetermine on 10/27/2020. Pt is currently taking the 30mg  dose. Pt denies any side effects from the medicaiton at this time. Pt's weight on 10/27/2020 was 182 lb weight today 169 lb. Pt's waist 36 in.

## 2020-11-27 NOTE — Progress Notes (Signed)
    GYNECOLOGY PROGRESS NOTE  Subjective:    Patient ID: Madison Randall, female    DOB: Nov 12, 1985, 35 y.o.   MRN: [id number]  HPI  Patient is a 35 y.o. female who presents for 1 month weight management follow up. She initiated use of Phentermine 1 month ago.  Denies any undesirable side effects and reports compliance with medications. Goal weight is 135-140 lbs.    Current interventions:  1. Diet - low carbs, increasing protein and veggies. Has increased water intake.  Has not had fast food in 2 months. No sodas. Cutting out late night eating.  2. Activity - 30-60 minutes of activities (mostly walking).  3. Reports bowel movements are normal.     The following portions of the patient's history were reviewed and updated as appropriate: allergies, current medications, past family history, past medical history, past social history, past surgical history and problem list.    Review of Systems Pertinent items noted in HPI and remainder of comprehensive ROS otherwise negative.   Objective:    Vitals with BMI 11/27/2020 10/27/2020 08/19/2020  Height 5\' 3"  5\' 3"  5\' 3"   Weight 169 lbs 6 oz 182 lbs 14 oz 187 lbs 6 oz  BMI 30.02 32.41 33.2  Systolic 117 122  Diastolic 87 80 85  Pulse 98 74 85    General appearance: alert and no distress Abdomen: soft, non-tender.  Waist circumference 36 in.    Labs:  No new labs Assessment:   Weight management Obesity, Body mass index is 30.01 kg/m.  Plan:   1. Can continue Phentermine for weight loss, has lost 13 lbs in first month.  Can continue use.  Reminded of use for between 3-4 months depending on weight loss progress.  Continue 30 mg dosing. RTC in 2 months.    , MD [facility]

## 2020-12-04 ENCOUNTER — Ambulatory Visit: Payer: Medicaid Other

## 2020-12-11 ENCOUNTER — Ambulatory Visit: Payer: Medicaid Other

## 2020-12-18 ENCOUNTER — Ambulatory Visit: Payer: Medicaid Other

## 2021-01-27 ENCOUNTER — Other Ambulatory Visit: Payer: Self-pay

## 2021-01-27 ENCOUNTER — Encounter: Payer: Self-pay | Admitting: Obstetrics and Gynecology

## 2021-01-27 ENCOUNTER — Ambulatory Visit (INDEPENDENT_AMBULATORY_CARE_PROVIDER_SITE_OTHER): Payer: Medicaid Other | Admitting: Obstetrics and Gynecology

## 2021-01-27 VITALS — BP 113/74 | HR 83 | Ht 63.0 in | Wt 155.1 lb

## 2021-01-27 DIAGNOSIS — N921 Excessive and frequent menstruation with irregular cycle: Secondary | ICD-10-CM | POA: Diagnosis not present

## 2021-01-27 DIAGNOSIS — E663 Overweight: Secondary | ICD-10-CM | POA: Diagnosis not present

## 2021-01-27 DIAGNOSIS — Z7689 Persons encountering health services in other specified circumstances: Secondary | ICD-10-CM | POA: Diagnosis not present

## 2021-01-27 DIAGNOSIS — N946 Dysmenorrhea, unspecified: Secondary | ICD-10-CM | POA: Diagnosis not present

## 2021-01-27 MED ORDER — PHENTERMINE HCL 30 MG PO CAPS: 30.0000 mg | ORAL_CAPSULE | ORAL | 0 refills | Status: DC

## 2021-01-27 NOTE — Patient Instructions (Signed)
Preventing Unhealthy Weight Gain, Adult Staying at a healthy weight is important to your overall health. When fat builds up in your body, you may become overweight or obese. Being overweight or obese increases your risk of developing certain health problems, such as heart disease, diabetes, sleeping problems, joint problems, and some types of cancer. Unhealthy weight gain is often the result of making unhealthy food choices or not getting enough exercise. You can make changes to your lifestyle to prevent obesity and stay as healthy as possible. What nutrition changes can be made?  Eat only as much as your body needs. To do this: ? Pay attention to signs that you are hungry or full. Stop eating as soon as you feel full. ? If you feel hungry, try drinking water first before eating. Drink enough water so your urine is clear or pale yellow. ? Eat smaller portions. Pay attention to portion sizes when eating out. ? Look at serving sizes on food labels. Most foods contain more than one serving per container. ? Eat the recommended number of calories for your gender and activity level. For most active people, a daily total of 2,000 calories is appropriate. If you are trying to lose weight or are not very active, you may need to eat fewer calories. Talk with your health care provider or a diet and nutrition specialist (dietitian) about how many calories you need each day.  Choose healthy foods, such as: ? Fruits and vegetables. At each meal, try to fill at least half of your plate with fruits and vegetables. ? Whole grains, such as whole-wheat bread, brown rice, and quinoa. ? Lean meats, such as chicken or fish. ? Other healthy proteins, such as beans, eggs, or tofu. ? Healthy fats, such as nuts, seeds, fatty fish, and olive oil. ? Low-fat or fat-free dairy products.  Check food labels, and avoid food and drinks that: ? Are high in calories. ? Have added sugar. ? Are high in sodium. ? Have saturated  fats or trans fats.  Cook foods in healthier ways, such as by baking, broiling, or grilling.  Make a meal plan for the week, and shop with a grocery list to help you stay on track with your purchases. Try to avoid going to the grocery store when you are hungry.  When grocery shopping, try to shop around the outside of the store first, where the fresh foods are. Doing this helps you to avoid prepackaged foods, which can be high in sugar, salt (sodium), and fat.   What lifestyle changes can be made?  Exercise for 30 or more minutes on 5 or more days each week. Exercising may include brisk walking, yard work, biking, running, swimming, and team sports like basketball and soccer. Ask your health care provider which exercises are safe for you.  Do muscle-strengthening activities, such as lifting weights or using resistance bands, on 2 or more days a week.  Do not use any products that contain nicotine or tobacco, such as cigarettes and e-cigarettes. If you need help quitting, ask your health care provider.  Limit alcohol intake to no more than 1 drink a day for nonpregnant women and 2 drinks a day for men. One drink equals 12 oz of beer, 5 oz of wine, or 1 oz of hard liquor.  Try to get 7-9 hours of sleep each night.   What other changes can be made?  Keep a food and activity journal to keep track of: ? What you ate and how   many calories you had. Remember to count the calories in sauces, dressings, and side dishes. ? Whether you were active, and what exercises you did. ? Your calorie, weight, and activity goals.  Check your weight regularly. Track any changes. If you notice you have gained weight, make changes to your diet or activity routine.  Avoid taking weight-loss medicines or supplements. Talk to your health care provider before starting any new medicine or supplement.  Talk to your health care provider before trying any new diet or exercise plan. Why are these changes  important? Eating healthy, staying active, and having healthy habits can help you to prevent obesity. Those changes also:  Help you manage stress and emotions.  Help you connect with friends and family.  Improve your self-esteem.  Improve your sleep.  Prevent long-term health problems. What can happen if changes are not made? Being obese or overweight can cause you to develop joint or bone problems, which can make it hard for you to stay active or do activities you enjoy. Being obese or overweight also puts stress on your heart and lungs and can lead to health problems like diabetes, heart disease, and some cancers. Where to find more information Talk with your health care provider or a dietitian about healthy eating and healthy lifestyle choices. You may also find information from:  [place] Department of Agriculture, MyPlate: https://ball-collins.biz/  American Heart Association: www.heart.org  Centers for Disease Control and Prevention: FootballExhibition.com.br Summary  Staying at a healthy weight is important to your overall health. It helps you to prevent certain diseases and health problems, such as heart disease, diabetes, joint problems, sleep disorders, and some types of cancer.  Being obese or overweight can cause you to develop joint or bone problems, which can make it hard for you to stay active or do activities you enjoy.  You can prevent unhealthy weight gain by eating a healthy diet, exercising regularly, not smoking, limiting alcohol, and getting enough sleep.  Talk with your health care provider or a dietitian for guidance about healthy eating and healthy lifestyle choices. This information is not intended to replace advice given to you by your health care provider. Make sure you discuss any questions you have with your health care provider. Document Revised: 12/05/2019 Document Reviewed: 12/05/2019 Elsevier Patient Education  2021 Elsevier Inc.  Dysfunctional Uterine  Bleeding Dysfunctional uterine bleeding is abnormal bleeding from the uterus. Dysfunctional uterine bleeding includes:  A menstrual period that comes earlier or later than usual.  A menstrual period that is lighter or heavier than usual, or has large blood clots.  Vaginal bleeding between menstrual periods.  Skipping one or more menstrual periods.  Vaginal bleeding after sex.  Vaginal bleeding after menopause. Follow these instructions at home: Eating and drinking  Eat well-balanced meals. Include foods that are high in iron, such as liver, meat, shellfish, green leafy vegetables, and eggs.  To prevent or treat constipation, your health care provider may recommend that you: ? Drink enough fluid to keep your urine pale yellow. ? Take over-the-counter or prescription medicines. ? Eat foods that are high in fiber, such as beans, whole grains, and fresh fruits and vegetables. ? Limit foods that are high in fat and processed sugars, such as fried or sweet foods.   Medicines  Take over-the-counter and prescription medicines only as told by your health care provider.  Do not change medicines without talking with your health care provider.  Aspirin or medicines that contain aspirin may make the bleeding  worse. Do not take those medicines: ? During the week before your menstrual period. ? During your menstrual period.  If you were prescribed iron pills, take them as told by your health care provider. Iron pills help to replace iron that your body loses because of this condition. Activity  If you need to change your sanitary pad or tampon more than one time every 2 hours: ? Lie in bed with your feet raised (elevated). ? Place a cold pack on your lower abdomen. ? Rest as much as possible until the bleeding stops or slows down.  Do not try to lose weight until the bleeding has stopped and your blood iron level is back to normal. General instructions  For two months, write  down: ? When your menstrual period starts. ? When your menstrual period ends. ? When any abnormal vaginal bleeding occurs. ? What problems you notice.  Keep all follow up visits as told by your health care provider. This is important.   Contact a health care provider if you:  Feel light-headed or weak.  Have nausea and vomiting.  Cannot eat or drink without vomiting.  Feel dizzy or have diarrhea while you are taking medicines.  Are taking birth control pills or hormones, and you want to change them or stop taking them. Get help right away if:  You develop a fever or chills.  You need to change your sanitary pad or tampon more than one time per hour.  Your vaginal bleeding becomes heavier, or your flow contains clots more often.  You develop pain in your abdomen.  You lose consciousness.  You develop a rash. Summary  Dysfunctional uterine bleeding is abnormal bleeding from the uterus.  It includes menstrual bleeding of abnormal duration, volume, or regularity.  Bleeding after sex and after menopause are also considered dysfunctional uterine bleeding. This information is not intended to replace advice given to you by your health care provider. Make sure you discuss any questions you have with your health care provider. Document Revised: 01/17/2018 Document Reviewed: 01/17/2018 Elsevier Patient Education  2021 [employer].

## 2021-01-27 NOTE — Progress Notes (Signed)
Pt present for weight management. Pt stated that she was doing well on her weight management medication but was having severe cramping and irregular cycles. Pt stated having several cycles throughout the month.  Waist measurement 33in.

## 2021-01-27 NOTE — Progress Notes (Signed)
    GYNECOLOGY PROGRESS NOTE  Subjective:    Patient ID: Madison Randall, female    DOB: February 28, 1986, 35 y.o.   MRN: [id number]  HPI  Patient is a 35 y.o. female who presents for 1 month weight management follow up. She initiated use of Phentermine 3 months ago.  Denies any undesirable side effects and reports compliance with medications. Goal weight is 135-140 lbs.    Current interventions:  1. Diet - low carbs, increasing protein and veggies. Good water intake.  No sodas or late night snacking 2. Activity - 30-60 minutes of activities (mostly walking).  3. Reports bowel movements are normal.     She also desires to report that her cycles have been heavier since the birth of he rlast child several years ago. Notes in the past 6 months her cycles have also become more painful and longer (now up to 7 days).   This past 2  months had 2 or more cycles (second cycle lasted ~ 4 days). Has not taken anything for her dysmenorrhea. Has tried a heating pad.  Does not like to take medications. Now bleeding again, very dark colored.    The following portions of the patient's history were reviewed and updated as appropriate: allergies, current medications, past family history, past medical history, past social history, past surgical history and problem list.   Review of Systems Pertinent items noted in HPI and remainder of comprehensive ROS otherwise negative.   Objective:    Vitals with BMI 01/27/2021 11/27/2020 10/27/2020  Height 5\' 3"  5\' 3"  5\' 3"   Weight 155 lbs 2 oz 169 lbs 6 oz 182 lbs 14 oz  BMI 27.48 30.02 32.41  Systolic 113 117  Diastolic 74 87 80  Pulse 83 98 74    General appearance: alert and no distress Abdomen: soft, non-tender.  Waist circumference 33 in.    Labs:  No new labs  Assessment:   Weight management Overweight, Body mass index is 27.47 kg/m. Dysmenorrhea Menometrorrhagia   Plan:   1.  Phentermine for weight loss, has lost 27 lbs in 3 months with use of  medication. month.  Can continue use.  Will allow 1 additional month as she will be closer to her weight loss goal. Continue 30 mg dosing. RTC in 1 month.  2. Menometrorrhagia with dysmenorrhea, unclear cause. Will check hormone levels and determine management accordingly. She would prefer not to utilize birth control if possible. Encouraged use of Tylenol or NSAIDs prn for dysmenorrhea. Can also utilize natural remedies such as magnesium supplements during cycles.    A total of 15 minutes were spent face-to-face with the patient during this encounter and over half of that time dealt with counseling and coordination of care.   , MD [facility]

## 2021-01-28 ENCOUNTER — Other Ambulatory Visit: Payer: Medicaid Other

## 2021-02-10 ENCOUNTER — Encounter: Payer: Self-pay | Admitting: Obstetrics and Gynecology

## 2021-02-26 ENCOUNTER — Other Ambulatory Visit: Payer: Self-pay

## 2021-02-26 ENCOUNTER — Ambulatory Visit (INDEPENDENT_AMBULATORY_CARE_PROVIDER_SITE_OTHER): Payer: Medicaid Other | Admitting: Obstetrics and Gynecology

## 2021-02-26 ENCOUNTER — Encounter: Payer: Self-pay | Admitting: Obstetrics and Gynecology

## 2021-02-26 VITALS — BP 116/79 | HR 82 | Ht 63.0 in | Wt 153.5 lb

## 2021-02-26 DIAGNOSIS — E663 Overweight: Secondary | ICD-10-CM | POA: Diagnosis not present

## 2021-02-26 DIAGNOSIS — Z7689 Persons encountering health services in other specified circumstances: Secondary | ICD-10-CM | POA: Diagnosis not present

## 2021-02-26 MED ORDER — PHENTERMINE HCL 37.5 MG PO CAPS: 37.5000 mg | ORAL_CAPSULE | ORAL | 0 refills | Status: DC

## 2021-02-26 NOTE — Progress Notes (Signed)
Pt present for weight check and management. Pt's last visit 01/27/2021 weight was 155 lb and BMI 27.47. Today's visit BMI 27.19;waist  32     and weight 153.5lb  .

## 2021-02-26 NOTE — Progress Notes (Addendum)
    GYNECOLOGY PROGRESS NOTE  Subjective:    Patient ID: Madison Randall, female    DOB: August 22, 1986, 35 y.o.   MRN: [id number]  HPI  Patient is a 35 y.o. female who presents for 1 month weight management follow up. She initiated use of Phentermine 3 months ago.  Denies any undesirable side effects and reports compliance with medications. Goal weight is 135-140 lbs. Notes some mild frustration as she is continuing her dietary and physical activities but not noting any further progress.    Current interventions:  1. Diet - low carbs, increasing protein and veggies. Good water intake.  No sodas or late night snacking 2. Activity - 30-60 minutes of activities (mostly walking).  3. Reports bowel movements are normal.     The following portions of the patient's history were reviewed and updated as appropriate: allergies, current medications, past family history, past medical history, past social history, past surgical history and problem list.   Review of Systems A comprehensive review of systems was negative except for: Ears, nose, mouth, throat, and face: positive for earaches x 2 days, left ear. Has been using topical agents to help (tea-tree oil). Does report she has been recently swimming several times.    Objective:    Vitals with BMI 02/26/2021 01/27/2021 11/27/2020  Height 5\' 3"  5\' 3"  5\' 3"   Weight 153 lbs 8 oz 155 lbs 2 oz 169 lbs 6 oz  BMI 27.2 27.48 30.02  Systolic 116 113  Diastolic 79 74 87  Pulse 82 83 98    General appearance: alert and no distress Ears: left external ear normal, non-tender. Internal ear with slightly boggy tympanic membrane, no erythema. Right ear not examined.  Abdomen: soft, non-tender.  Waist circumference 32 in.    Labs:  No new labs  Assessment:   Weight management Overweight, Body mass index is 27.19 kg/m. Earache   Plan:    Limited weight loss in last month with Phentermine. Did lose 1 inch in waist. Will allow 1 additional month as  she will be closer to her weight loss goal. Will increase to 37.5 mg dosing.  Also encouraged patient to increase cardio or mix walking routine as her body may be used to current regimen. RTC in 1 month.  Ear ache improving with OTC topical agents.      , MD [facility]

## 2021-03-25 NOTE — Progress Notes (Signed)
Entered in error

## 2021-03-26 ENCOUNTER — Encounter: Payer: Medicaid Other | Admitting: Obstetrics and Gynecology

## 2021-03-29 ENCOUNTER — Encounter: Payer: Self-pay | Admitting: Obstetrics and Gynecology

## 2021-05-06 IMAGING — US US PELVIS COMPLETE
1 series · 14 of 25 positions shown · non-contrast
Comparison: None.

CLINICAL DATA: Assessment of retained products. Delivered on [REDACTED].

EXAM:
TRANSABDOMINAL ULTRASOUND OF PELVIS
TECHNIQUE: Transabdominal ultrasound examination of the pelvis was performed
including evaluation of the uterus, ovaries, adnexal regions, and
pelvic cul-de-sac. Patient declined transvaginal ultrasound.

[Series 1: us pelvis complete · 41 acquisitions, 14 frames shown]
[im 1/41]
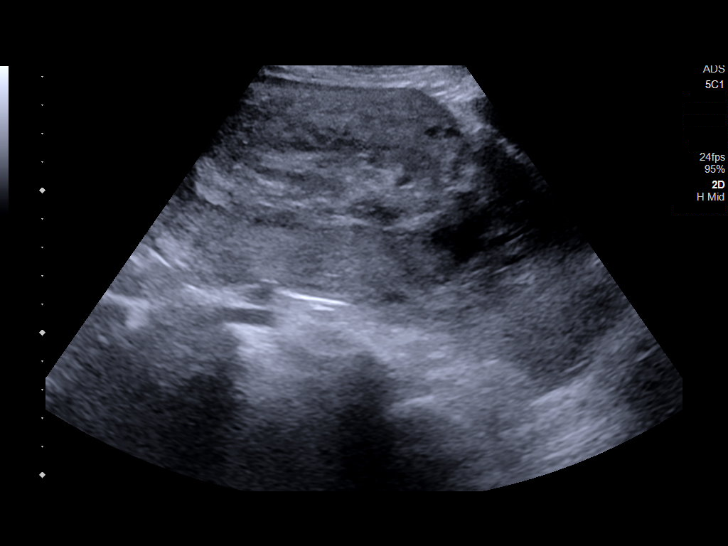
[im 4/41]
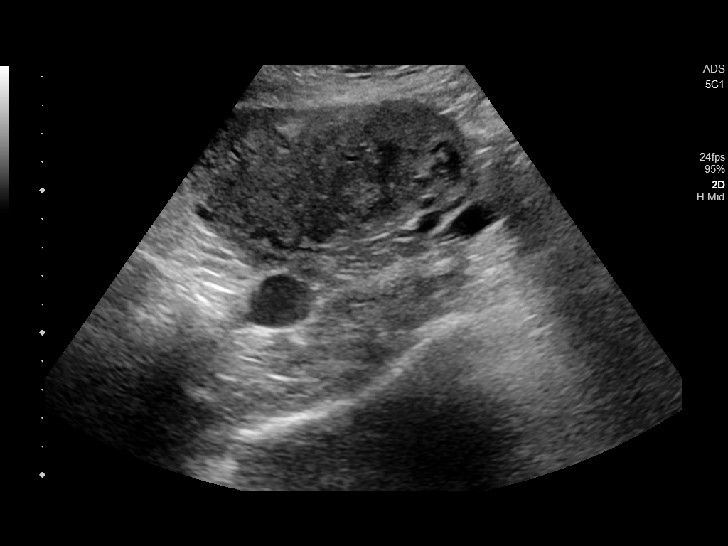
[im 7/41]
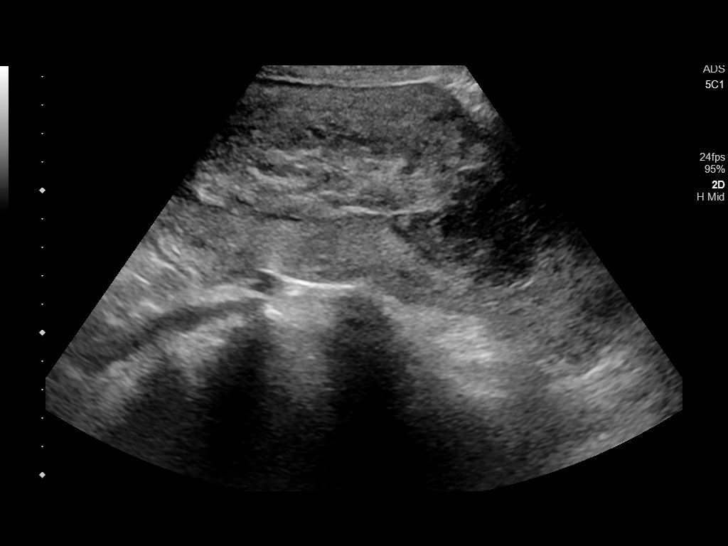
[im 11/41]
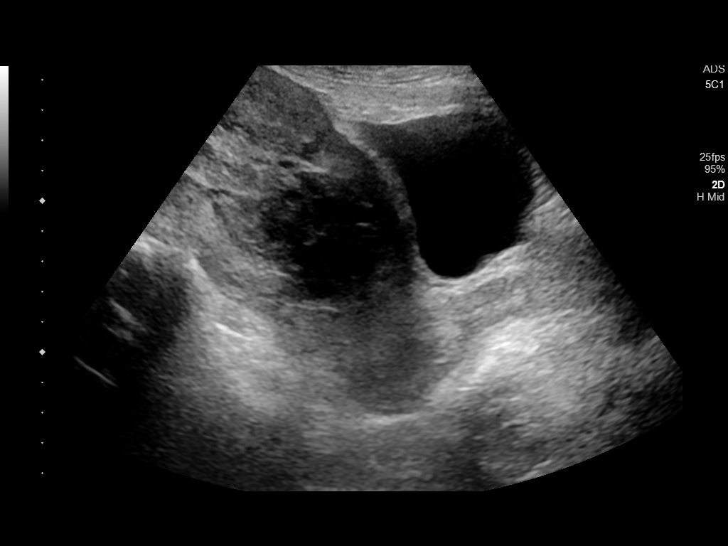
[im 14/41]
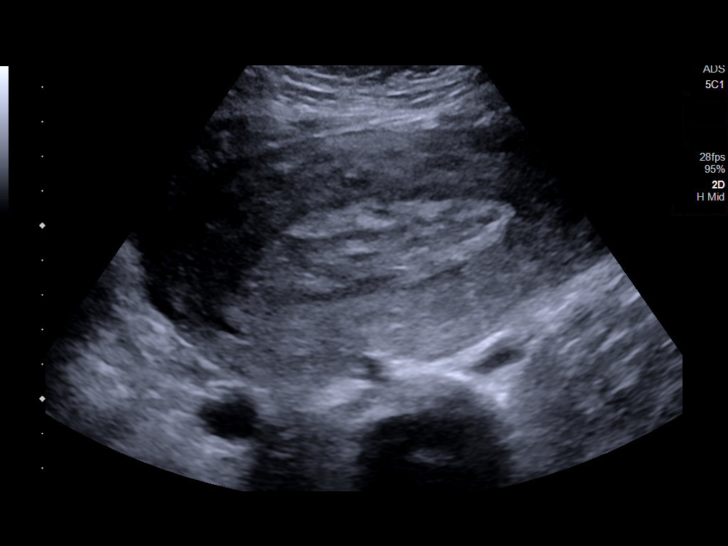
[im 16/41]
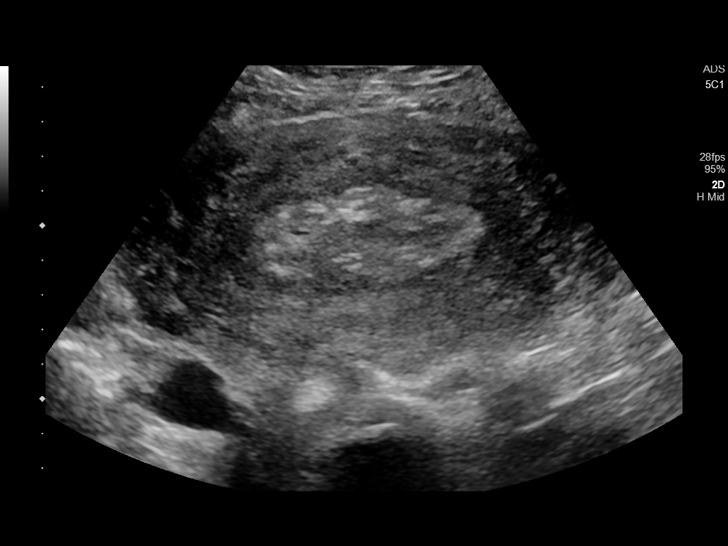
[im 19/41]
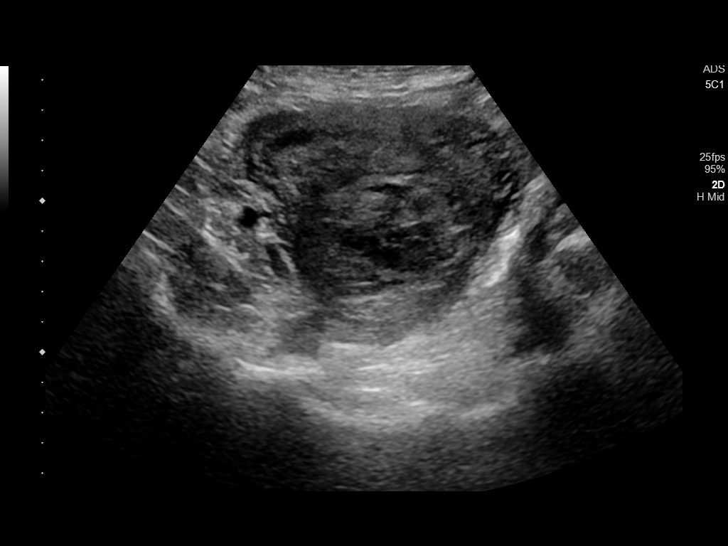
[im 22/41]
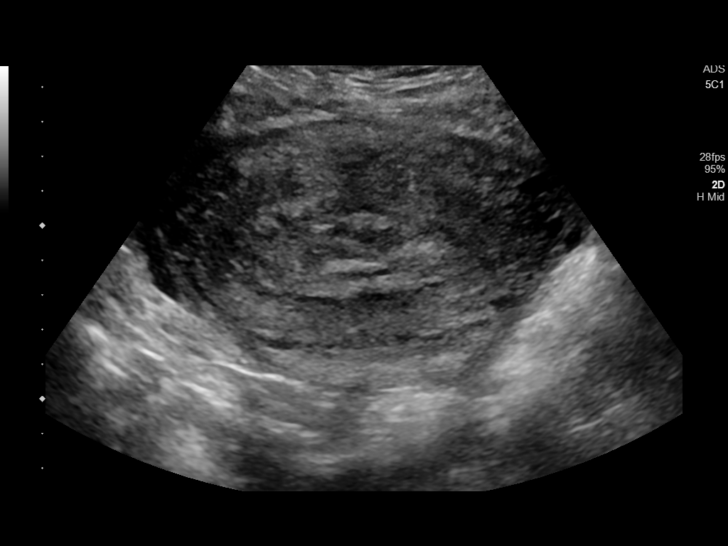
[im 26/41]
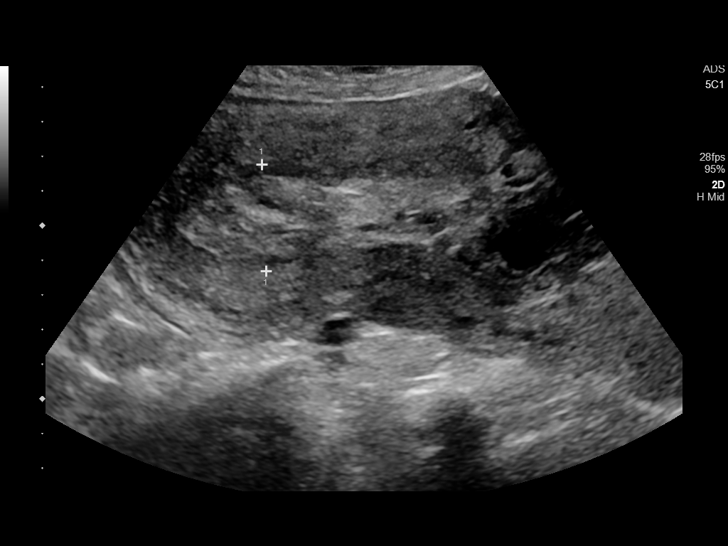
[im 27/41]
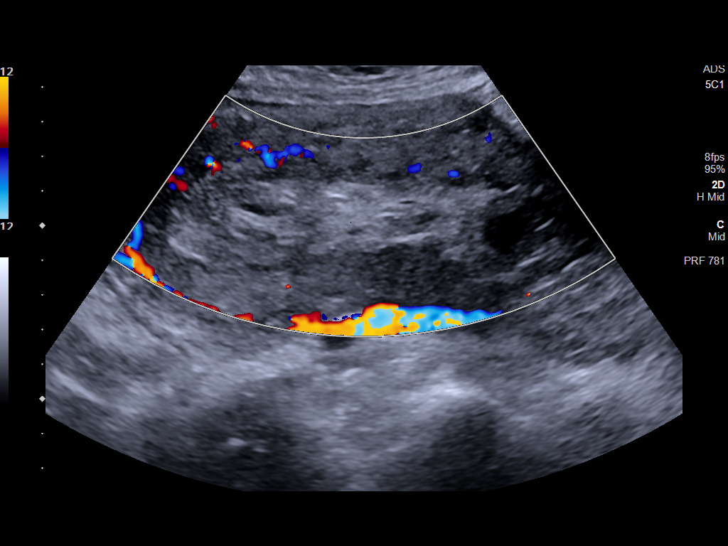
[im 31/41]
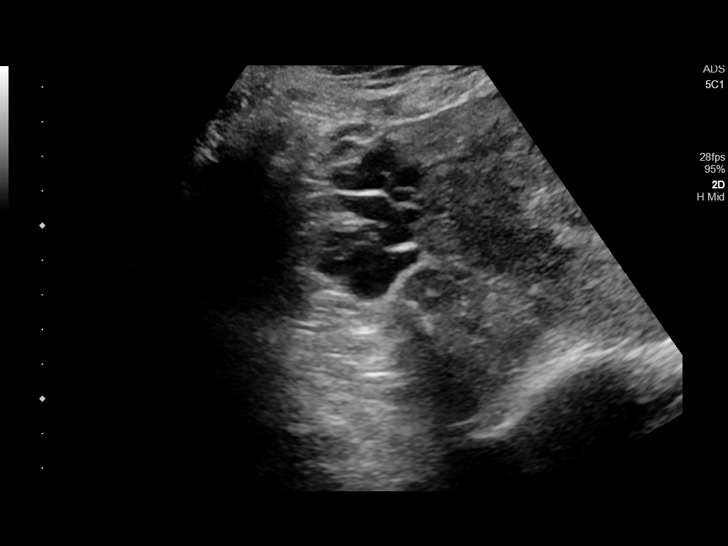
[im 34/41]
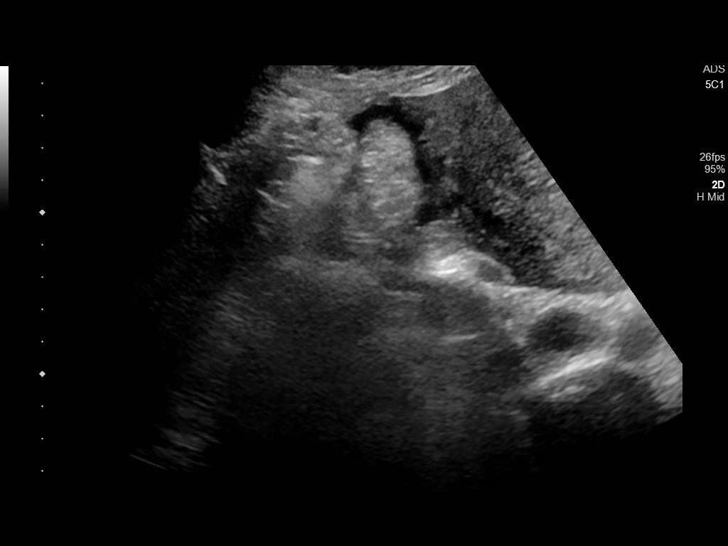
[im 37/41]
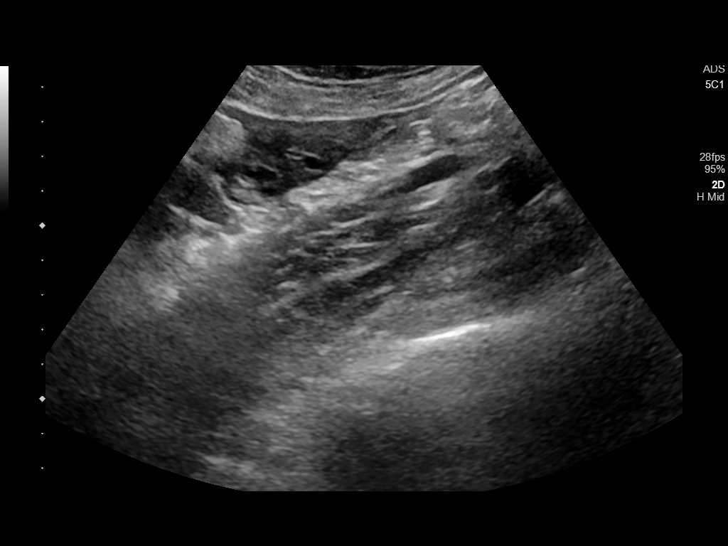
[im 41/41]
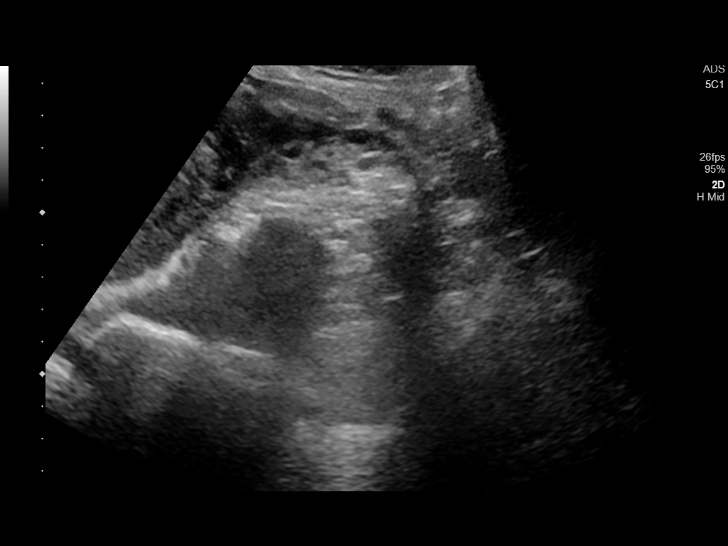

[14 of 25 positions shown; findings below may reference images not displayed]

FINDINGS: Uterus

Measurements: 17.8 x 7.3 x 12.9 cm = volume: 888 mL. No fibroids or
other mass visualized.

Endometrium

Thickness: 3.1 cm. Generalized thickening and heterogeneity without
focal mass or fluid collection.

Right ovary

Measurements: Not seen. No mass or free fluid identified in the
RIGHT adnexal region.

Left ovary

Measurements: Not seen. No mass or free fluid identified in the LEFT
adnexal region

Other findings:  No abnormal free fluid.
IMPRESSION: Thickened/heterogeneous endometrial complex, measuring up to 3.1 cm
thickness, suspicious for retained products of conception if
persistent bleeding.

## 2022-03-07 ENCOUNTER — Ambulatory Visit: Payer: Medicaid Other | Admitting: Obstetrics and Gynecology

## 2022-03-07 ENCOUNTER — Encounter: Payer: Self-pay | Admitting: Obstetrics and Gynecology

## 2022-03-07 ENCOUNTER — Telehealth: Payer: Self-pay | Admitting: Obstetrics and Gynecology

## 2022-03-07 ENCOUNTER — Telehealth: Payer: Self-pay | Admitting: Family Medicine

## 2022-03-07 VITALS — Ht 63.0 in | Wt 153.0 lb

## 2022-03-07 DIAGNOSIS — R399 Unspecified symptoms and signs involving the genitourinary system: Secondary | ICD-10-CM

## 2022-03-07 LAB — POCT URINALYSIS DIPSTICK
Bilirubin, UA: NEGATIVE
Glucose, UA: NEGATIVE
Ketones, UA: NEGATIVE
Leukocytes, UA: NEGATIVE
Nitrite, UA: NEGATIVE
Protein, UA: POSITIVE — AB
Spec Grav, UA: >=1.03 — AB (ref 1.010–1.025)
Urobilinogen, UA: 0.2 E.U./dL
pH, UA: 6 (ref 5.0–8.0)

## 2022-03-07 MED ORDER — SULFAMETHOXAZOLE-TRIMETHOPRIM 800-160 MG PO TABS: 1.0000 | ORAL_TABLET | Freq: Two times a day (BID) | ORAL | 0 refills | Status: DC

## 2022-03-07 NOTE — Progress Notes (Unsigned)
Madison Randall is a 36 y.o. female who complains of UTI symptoms, urgency, pain. Blood found in urine, rx sent to confirmed pharmacy on file per protocol. Urine culture sent off for further testing. All patient questions answered at this time.

## 2022-03-07 NOTE — Telephone Encounter (Signed)
Pt states in addition to a prolonged cycle that started 01/29/2022. She suspects a UTI - Pt states the blood is brown and dark , but not heavy. Pt has a Urine drop off scheduled for 07/17 to address a possible UTI. Pt has a appt on 07/26 to address the cycle concerns. I added the pt to the wait list in hopes of an earlier appt. Pt states she has been having symptoms of weakness, slow with reactions, no energy . Pt  feels like it is a lot of blood loss due to length of cycle. Pt states she has no energy. Please return call.

## 2022-03-07 NOTE — Telephone Encounter (Signed)
Made in error

## 2022-03-09 LAB — URINE CULTURE
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
## 2022-03-16 ENCOUNTER — Encounter: Payer: Self-pay | Admitting: Obstetrics and Gynecology

## 2022-03-16 ENCOUNTER — Ambulatory Visit (INDEPENDENT_AMBULATORY_CARE_PROVIDER_SITE_OTHER): Payer: Medicaid Other | Admitting: Obstetrics and Gynecology

## 2022-03-16 VITALS — BP 115/57 | HR 73 | Resp 16 | Ht 63.0 in | Wt 144.4 lb

## 2022-03-16 DIAGNOSIS — T192XXA Foreign body in vulva and vagina, initial encounter: Secondary | ICD-10-CM | POA: Diagnosis not present

## 2022-03-16 DIAGNOSIS — M549 Dorsalgia, unspecified: Secondary | ICD-10-CM

## 2022-03-16 DIAGNOSIS — N939 Abnormal uterine and vaginal bleeding, unspecified: Secondary | ICD-10-CM

## 2022-03-16 DIAGNOSIS — R109 Unspecified abdominal pain: Secondary | ICD-10-CM

## 2022-03-17 NOTE — Telephone Encounter (Signed)
Patient was evaluated by Dr. Valentino Saxon yesterday.

## 2022-04-04 ENCOUNTER — Encounter: Payer: Self-pay | Admitting: Obstetrics and Gynecology

## 2022-07-27 ENCOUNTER — Encounter: Payer: Self-pay | Admitting: Obstetrics and Gynecology

## 2022-08-12 ENCOUNTER — Telehealth: Payer: Self-pay | Admitting: Obstetrics and Gynecology

## 2022-08-12 NOTE — Telephone Encounter (Signed)
I contacted patient via phone. Patient is scheduled for 08/30/22 with Dr. Valentino Saxon at 8:55 am. She will be in surgery. I left message for patient to call back to confirm appointment time to Wednesday, 08/31/22 at 8:35 with Dr. Valentino Saxon.

## 2022-08-24 NOTE — Telephone Encounter (Signed)
Pt is aware of appt. Change.

## 2022-08-29 NOTE — Progress Notes (Unsigned)
    GYNECOLOGY PROGRESS NOTE  Subjective:    Patient ID: Madison Randall, female    DOB: 08/27/1985, 37 y.o.   MRN: [id number]  HPI  Patient is a 37 y.o. female who presents to resume weight management.  Patient notes that she started Phentermine  slightly over 1.5 years ago and lost almost 30 lbs.  Was doing well to keep her weight off but over the past few months has had many life stressors which have lead to emotional eating and weight gain. Now desires to resume weight loss as she has identified that she emotionally eats and desires to resume weight loss management.  She has a past history of obesity. Goal weight is 135-140 lbs.    Current interventions:  1. Diet - She doesn't eat after a certain time at night. She has cut out sodas. Increased her water intake. Decreasing portion sizes. 2. Activity - No exercise currently but plans to begin walking again when weather permits.   3. Reports bowel movements are normal.    The following portions of the patient's history were reviewed and updated as appropriate: allergies, current medications, past family history, past medical history, past social history, past surgical history, and problem list.  Review of Systems Pertinent items noted in HPI and remainder of comprehensive ROS otherwise negative.   Objective:       08/31/2022    8:36 AM 03/16/2022   10:44 AM 03/07/2022    2:01 PM  Vitals with BMI  Height 5\' 3"  5\' 3"  5\' 3"   Weight 157 lbs 13 oz 144 lbs 6 oz 153 lbs  BMI 27.96 35.32 99.24  Systolic 268 341   Diastolic 68 57   Pulse 78 73     General appearance: alert, cooperative, and no distress Abdomen: soft, non-tender.  Waist circumference 37.5 in.    Labs:  No new labs  Assessment:   Weight management Overweight, Body mass index is 27.95 kg/m.  Plan:   The risks and benefits and side effects of medication were reviewed.  Adipex (Phenteramine), The pros and cons of suppressing appetite and boosting metabolism is  discussed. Risks of tolerence and addiction is discussed for selected agents discussed. Use of medicine will ne short term, such as 3-4 months at a time followed by a period of time off of the medicine to avoid these risks and side effects for Adipex discussed. Pt to call with any negative side effects and agrees to keep follow up appts.  Discussed indoor exercises that patient can engage in. Encourage healthy diet, higher protein/low carb.  Discussed emotional eating, identifying triggers, substituting eating behaviors for more healthy behaviors (or eating healthier items if emotionally charged).  RTC in 1 month.    A total of 15 minutes were spent face-to-face with the patient during this encounter and over half of that time dealt with counseling and care management.   Rubie Maid, MD [facility]

## 2022-08-30 ENCOUNTER — Ambulatory Visit: Payer: Medicaid Other | Admitting: Obstetrics and Gynecology

## 2022-08-31 ENCOUNTER — Encounter: Payer: Self-pay | Admitting: Obstetrics and Gynecology

## 2022-08-31 ENCOUNTER — Ambulatory Visit (INDEPENDENT_AMBULATORY_CARE_PROVIDER_SITE_OTHER): Payer: Medicaid Other | Admitting: Obstetrics and Gynecology

## 2022-08-31 VITALS — BP 122/68 | HR 78 | Resp 16 | Ht 63.0 in | Wt 157.8 lb

## 2022-08-31 DIAGNOSIS — E663 Overweight: Secondary | ICD-10-CM

## 2022-08-31 DIAGNOSIS — Z6827 Body mass index (BMI) 27.0-27.9, adult: Secondary | ICD-10-CM | POA: Diagnosis not present

## 2022-08-31 DIAGNOSIS — Z713 Dietary counseling and surveillance: Secondary | ICD-10-CM | POA: Diagnosis not present

## 2022-08-31 DIAGNOSIS — Z7689 Persons encountering health services in other specified circumstances: Secondary | ICD-10-CM

## 2022-08-31 MED ORDER — PHENTERMINE HCL 37.5 MG PO CAPS: 37.5000 mg | ORAL_CAPSULE | ORAL | 1 refills | Status: DC

## 2022-09-27 NOTE — Progress Notes (Unsigned)
    GYNECOLOGY PROGRESS NOTE  Subjective:    Patient ID: Madison Randall, female    DOB: Apr 15, 1986, 37 y.o.   MRN: [id number]  HPI  Patient is a 37 y.o. female who presents for 1 month weight management follow up. She has a past history of obesity.. She initiated use of Phentermine 1 months ago.  Denies any undesirable side effects and reports compliance with medications.    Current interventions:  1. Diet -  2. Activity -  3. Reports bowel movements are ***.    The following portions of the patient's history were reviewed and updated as appropriate: allergies, current medications, past family history, past medical history, past social history, past surgical history, and problem list.  Review of Systems Pertinent items noted in HPI and remainder of comprehensive ROS otherwise negative.   Objective:       08/31/2022    8:36 AM 03/16/2022   10:44 AM 03/07/2022    2:01 PM  Vitals with BMI  Height 5\' 3"  5\' 3"  5\' 3"   Weight 157 lbs 13 oz 144 lbs 6 oz 153 lbs  BMI 27.96 26.37 85.88  Systolic 502 774   Diastolic 68 57   Pulse 78 73     General appearance: alert, cooperative, and no distress Abdomen: soft, non-tender.  Waist circumference *** in.    Labs:   Assessment:   Weight management Obesity, There is no height or weight on file to calculate BMI.  Plan:   Weight management  - doing well with weight loss, can continue current management.      Rubie Maid, MD [place]

## 2022-09-28 ENCOUNTER — Encounter: Payer: Self-pay | Admitting: Obstetrics and Gynecology

## 2022-09-28 ENCOUNTER — Ambulatory Visit (INDEPENDENT_AMBULATORY_CARE_PROVIDER_SITE_OTHER): Payer: Medicaid Other | Admitting: Obstetrics and Gynecology

## 2022-09-28 VITALS — BP 105/76 | HR 82 | Resp 16 | Ht 63.0 in | Wt 151.2 lb

## 2022-09-28 DIAGNOSIS — E663 Overweight: Secondary | ICD-10-CM | POA: Diagnosis not present

## 2022-09-28 DIAGNOSIS — Z713 Dietary counseling and surveillance: Secondary | ICD-10-CM

## 2022-09-28 DIAGNOSIS — Z6826 Body mass index (BMI) 26.0-26.9, adult: Secondary | ICD-10-CM | POA: Diagnosis not present

## 2022-09-28 DIAGNOSIS — Z7689 Persons encountering health services in other specified circumstances: Secondary | ICD-10-CM

## 2022-09-28 MED ORDER — PHENTERMINE HCL 37.5 MG PO CAPS: 37.5000 mg | ORAL_CAPSULE | ORAL | 1 refills | Status: DC

## 2022-10-25 NOTE — Progress Notes (Deleted)
    GYNECOLOGY PROGRESS NOTE  Subjective:    Patient ID: Madison Randall, female    DOB: 10-25-1985, 37 y.o.   MRN: [id number]  HPI  Patient is a 37 y.o. female who presents for 1 month weight management follow up. She has a past history of obesity. She initiated use of  Phentermine  2 months ago.  Denies any undesirable side effects and reports compliance with medications.    Current interventions:  1. Diet -  2. Activity -  3. Reports bowel movements are ***.    The following portions of the patient's history were reviewed and updated as appropriate: allergies, current medications, past family history, past medical history, past social history, past surgical history, and problem list.  Review of Systems Pertinent items are noted in HPI.   Objective:       09/28/2022    1:47 PM 08/31/2022    8:36 AM 03/16/2022   10:44 AM  Vitals with BMI  Height '5\' 3"'$  '5\' 3"'$  '5\' 3"'$   Weight 151 lbs 3 oz 157 lbs 13 oz 144 lbs 6 oz  BMI 26.79 [id number]  Systolic 123456 [id number]  Diastolic 76 68 57  Pulse 82 78 73    General appearance: alert, cooperative, and no distress Abdomen: soft, non-tender.  Waist circumference *** in.    Labs:   Assessment:   Weight management Obesity, There is no height or weight on file to calculate BMI.  Plan:   Weight management  - doing well with weight loss, can continue current management.      Landis Gandy, CMA [facility]

## 2022-10-26 ENCOUNTER — Ambulatory Visit: Payer: Medicaid Other | Admitting: Obstetrics and Gynecology

## 2022-11-21 ENCOUNTER — Ambulatory Visit
Admission: RE | Admit: 2022-11-21 | Discharge: 2022-11-21 | Disposition: A | Discharge status: Home / Self Care | Payer: Medicaid Other | Source: Ambulatory Visit | Attending: Emergency Medicine | Admitting: Emergency Medicine

## 2022-11-21 ENCOUNTER — Ambulatory Visit: Payer: Self-pay

## 2022-11-21 VITALS — BP 134/88 | HR 98 | Temp 97.9°F | Resp 18

## 2022-11-21 DIAGNOSIS — L237 Allergic contact dermatitis due to plants, except food: Secondary | ICD-10-CM | POA: Diagnosis not present

## 2022-11-21 MED ORDER — METHYLPREDNISOLONE SODIUM SUCC 125 MG IJ SOLR
80.0000 mg | Freq: Once | INTRAMUSCULAR | Status: AC
  Administered 2022-11-21: 80 mg via INTRAMUSCULAR

## 2022-11-21 MED ORDER — PREDNISONE 10 MG (21) PO TBPK: ORAL_TABLET | Freq: Every day | ORAL | 0 refills | Status: DC

## 2022-11-21 NOTE — ED Provider Notes (Signed)
Madison Randall    CSN: [id number] Arrival date & time: 11/21/22  1751      History   Chief Complaint Chief Complaint  Patient presents with   Allergic Reaction    I don't know what it is. Allergic, poison something. I'm blistered, hurting and itchy and it continues to spread. - Entered by patient    HPI Madison Randall is a 37 y.o. female.  Patient presents with 1 week history of pruritic rash on extremities and trunk after working in her yard.  The rash started on her right wrist and spread to her arms, then to her trunk and legs.  Treating with Calamine lotion, hydrocortisone cream, aloe, lavender oil.  No fever, sore throat, cough, shortness of breath, or other symptoms.    The history is provided by the patient and medical records.    Past Medical History:  Diagnosis Date   Amenorrhea    Migraine    Migraines     Patient Active Problem List   Diagnosis Date Noted   Female cystocele 08/21/2020   Pelvic floor dysfunction 08/21/2020   Sty, external 05/14/2019   Plantar fasciitis, bilateral 07/19/2017   Sebaceous cyst of right axilla 07/19/2017   Overweight (BMI 25.0-29.9) 04/28/2016   Migraines 04/04/2016    Past Surgical History:  Procedure Laterality Date   DILATION AND CURETTAGE OF UTERUS  09/2014 uterine infection form delivery 2015    OB History     Gravida  6   Para  5   Term  5   Preterm      AB  1   Living  5      SAB  1   IAB      Ectopic      Multiple  0   Live Births  5            Home Medications    Prior to Admission medications   Medication Sig Start Date End Date Taking? Authorizing Provider  predniSONE (STERAPRED UNI-PAK 21 TAB) 10 MG (21) TBPK tablet Take by mouth daily. As directed 11/22/22  Yes Sharion Balloon, NP  phentermine 37.5 MG capsule Take 1 capsule (37.5 mg total) by mouth every morning. Patient not taking: Reported on 11/21/2022 09/28/22   Rubie Maid, MD    Family History Family History  Problem  Relation Age of Onset   Heart disease Father    Alcohol abuse Father    Breast cancer Maternal Grandmother    Thyroid disease Maternal Grandmother    Heart disease Maternal Grandfather    Breast cancer Paternal Grandmother    Breast cancer Other    Other Mother        trigeminal neuralgia    Social History Social History   Tobacco Use   Smoking status: Former   Smokeless tobacco: Never  Scientific laboratory technician Use: Never used  Substance Use Topics   Alcohol use: No   Drug use: No     Allergies   Tdap [tetanus-diphth-acell pertussis], Imitrex [sumatriptan], and Topamax [topiramate]   Review of Systems Review of Systems  Constitutional:  Negative for chills and fever.  HENT:  Negative for sore throat, trouble swallowing and voice change.   Respiratory:  Negative for cough and shortness of breath.   Cardiovascular:  Negative for chest pain and palpitations.  Skin:  Positive for rash.  All other systems reviewed and are negative.    Physical Exam Triage Vital Signs ED Triage Vitals  Enc Vitals Group     BP      Pulse      Resp      Temp      Temp src      SpO2      Weight      Height      Head Circumference      Peak Flow      Pain Score      Pain Loc      Pain Edu?      Excl. in [place]?    No data found.  Updated Vital Signs BP 134/88   Pulse 98   Temp 97.9 F (36.6 C)   Resp 18   LMP 11/16/2022   SpO2 98%   Visual Acuity Right Eye Distance:   Left Eye Distance:   Bilateral Distance:    Right Eye Near:   Left Eye Near:    Bilateral Near:     Physical Exam Vitals and nursing note reviewed.  Constitutional:      General: She is not in acute distress.    Appearance: She is well-developed. She is not ill-appearing.  HENT:     Mouth/Throat:     Mouth: Mucous membranes are moist.  Cardiovascular:     Rate and Rhythm: Normal rate and regular rhythm.     Heart sounds: Normal heart sounds.  Pulmonary:     Effort: Pulmonary effort is normal. No  respiratory distress.     Breath sounds: Normal breath sounds.  Musculoskeletal:     Cervical back: Neck supple.  Skin:    General: Skin is warm and dry.     Findings: Rash present.     Comments: Bulla, vesicle, papules on extremities and trunk.  See pictures.   Neurological:     Mental Status: She is alert.  Psychiatric:        Mood and Affect: Mood normal.        Behavior: Behavior normal.               UC Treatments / Results  Labs (all labs ordered are listed, but only abnormal results are displayed) Labs Reviewed - No data to display  EKG   Radiology No results found.  Procedures Procedures (including critical care time)  Medications Ordered in UC Medications  methylPREDNISolone sodium succinate (SOLU-MEDROL) 125 mg/2 mL injection 80 mg (has no administration in time range)    Initial Impression / Assessment and Plan / UC Course  I have reviewed the triage vital signs and the nursing notes.  Pertinent labs & imaging results that were available during my care of the patient were reviewed by me and considered in my medical decision making (see chart for details).   Poison ivy dermatitis.  Solu-Medrol given here; starting prednisone taper tomorrow.  Discussed Benadryl or Zyrtec.  Education provided on poison ivy dermatitis.  Instructed patient to follow up with her PCP if her symptoms are not improving.  She agrees to plan of care.     Final Clinical Impressions(s) / UC Diagnoses   Final diagnoses:  Poison ivy dermatitis     Discharge Instructions      You were given an injection of a steroid called Solu-Medrol.  Start the prednisone taper tomorrow as directed.    Take Benadryl or Zyrtec as directed.    Follow up with your primary care provider if your symptoms are not improving.        ED Prescriptions  Medication Sig Dispense Auth. Provider   predniSONE (STERAPRED UNI-PAK 21 TAB) 10 MG (21) TBPK tablet Take by mouth daily. As  directed 21 tablet Sharion Balloon, NP      PDMP not reviewed this encounter.   Sharion Balloon, NP 11/21/22 [phone]

## 2022-11-21 NOTE — ED Triage Notes (Addendum)
Patient to Urgent Care with complaints of blistering/ painful and itchy rash that first appeared over a week ago; rash still spreading to entire body.    Patient reports she was working in her yard prior to rash onset. States she was inside of a fig bush. Denies any other known cause.  Has been using calamine lotion (worsened rash), aloe lotion, using lavender oil/ hydrocortisone wash and cream

## 2022-11-21 NOTE — Discharge Instructions (Addendum)
You were given an injection of a steroid called Solu-Medrol.  Start the prednisone taper tomorrow as directed.    Take Benadryl or Zyrtec as directed.    Follow up with your primary care provider if your symptoms are not improving.

## 2022-12-19 NOTE — Progress Notes (Unsigned)
    GYNECOLOGY PROGRESS NOTE  Subjective:    Patient ID: Madison Randall, female    DOB: 1986/06/28, 37 y.o.   MRN: [id number]  HPI  Patient is a 37 y.o. female who presents for 3 month weight management follow up. She has a past history of obesity. She initiated use of Phentermine 3 months ago.  Denies any undesirable side effects and reports compliance with medications.    Current interventions:  1. Diet - Had tried to cut back more on caloric intake, but notes that she started to feel sick (significant eye twitching) so returned to previous diet. Now has changed to more of intermittent fasting dieting and feels better. 2. Activity - Has started back walking some days, plans to increase when weather gets warmer.  3. Reports bowel movements are normal.    {Common ambulatory SmartLinks:19316}  Review of Systems {ros; complete:30496}   Objective:       11/21/2022    6:07 PM 11/21/2022    5:58 PM 09/28/2022    1:47 PM  Vitals with BMI  Height   5\' 3"   Weight   151 lbs 3 oz  BMI   26.79  Systolic 134  105  Diastolic 88  76  Pulse  98 82    General appearance: {general exam:16600} Abdomen: soft, non-tender.  Waist circumference *** in.    Labs:   Assessment:   Weight management Obesity, There is no height or weight on file to calculate BMI.  Plan:   Weight management  - doing well with weight loss, can continue current management.      Hildred Laser, MD [facility]

## 2022-12-20 ENCOUNTER — Ambulatory Visit (INDEPENDENT_AMBULATORY_CARE_PROVIDER_SITE_OTHER): Payer: Medicaid Other | Admitting: Obstetrics and Gynecology

## 2022-12-20 ENCOUNTER — Encounter: Payer: Self-pay | Admitting: Obstetrics and Gynecology

## 2022-12-20 VITALS — BP 126/77 | HR 78 | Resp 16 | Ht 63.0 in | Wt 146.4 lb

## 2022-12-20 DIAGNOSIS — E663 Overweight: Secondary | ICD-10-CM

## 2022-12-20 DIAGNOSIS — Z8639 Personal history of other endocrine, nutritional and metabolic disease: Secondary | ICD-10-CM

## 2022-12-20 DIAGNOSIS — Z6825 Body mass index (BMI) 25.0-25.9, adult: Secondary | ICD-10-CM

## 2022-12-20 DIAGNOSIS — Z7689 Persons encountering health services in other specified circumstances: Secondary | ICD-10-CM

## 2023-01-17 NOTE — Progress Notes (Unsigned)
    GYNECOLOGY PROGRESS NOTE  Subjective:    Patient ID: Madison Randall, female    DOB: 1986-06-02, 37 y.o.   MRN: [id number]  HPI  Patient is a 37 y.o. female who presents for 4 month weight management follow up. She has a past history of obesity. She initiated use of Phentermine 4 months ago.  Denies any undesirable side effects and reports compliance with medications.    Current interventions:  1. Diet - Had tried to cut back more on caloric intake, but notes that she started to feel sick (significant eye twitching) so returned to previous diet. Now has changed to more of intermittent fasting dieting and feels better. 2. Activity - Has started back walking some days. Started 28 day challenge. 3. Reports bowel movements are normal.   The following portions of the patient's history were reviewed and updated as appropriate: allergies, current medications, past family history, past medical history, past social history, past surgical history, and problem list.  Review of Systems Pertinent items are noted in HPI.   Objective:       01/18/2023   10:00 AM 12/20/2022    2:07 PM 11/21/2022    6:07 PM  Vitals with BMI  Height 5\' 3"  5\' 3"    Weight 140 lbs 14 oz 146 lbs 6 oz   BMI 24.97 25.93   Systolic 113 126 409  Diastolic 71 77 88  Pulse 77 78     General appearance: alert, cooperative, and no distress Abdomen: soft, non-tender.  Waist circumference: 29 in.  in.    Labs:   Assessment:   Weight management Obesity, Body mass index is 24.96 kg/m.  Plan:   Weight management  - doing well with weight loss, can continue current management with 1 additional month of medication to get patient closer to weight goal of 130 lbs.   No further follow up needed after last round of medication.      Hildred Laser, MD [facility]

## 2023-01-18 ENCOUNTER — Encounter: Payer: Self-pay | Admitting: Obstetrics and Gynecology

## 2023-01-18 ENCOUNTER — Ambulatory Visit (INDEPENDENT_AMBULATORY_CARE_PROVIDER_SITE_OTHER): Payer: Medicaid Other | Admitting: Obstetrics and Gynecology

## 2023-01-18 VITALS — BP 113/71 | HR 77 | Resp 16 | Ht 63.0 in | Wt 140.9 lb

## 2023-01-18 DIAGNOSIS — Z6828 Body mass index (BMI) 28.0-28.9, adult: Secondary | ICD-10-CM

## 2023-01-18 DIAGNOSIS — Z7689 Persons encountering health services in other specified circumstances: Secondary | ICD-10-CM

## 2023-01-18 DIAGNOSIS — E669 Obesity, unspecified: Secondary | ICD-10-CM | POA: Diagnosis not present

## 2023-01-18 DIAGNOSIS — Z713 Dietary counseling and surveillance: Secondary | ICD-10-CM

## 2023-01-18 DIAGNOSIS — Z8639 Personal history of other endocrine, nutritional and metabolic disease: Secondary | ICD-10-CM

## 2023-01-18 MED ORDER — PHENTERMINE HCL 37.5 MG PO CAPS: 37.5000 mg | ORAL_CAPSULE | ORAL | 0 refills | Status: DC

## 2023-11-02 ENCOUNTER — Encounter: Payer: Self-pay | Admitting: Obstetrics and Gynecology

## 2023-11-02 ENCOUNTER — Ambulatory Visit (INDEPENDENT_AMBULATORY_CARE_PROVIDER_SITE_OTHER): Admitting: Obstetrics and Gynecology

## 2023-11-02 VITALS — BP 116/71 | HR 76 | Ht 63.0 in | Wt 152.0 lb

## 2023-11-02 DIAGNOSIS — N951 Menopausal and female climacteric states: Secondary | ICD-10-CM

## 2023-11-02 DIAGNOSIS — R21 Rash and other nonspecific skin eruption: Secondary | ICD-10-CM

## 2023-11-02 NOTE — Progress Notes (Signed)
    GYNECOLOGY PROGRESS NOTE  Subjective:    Patient ID: Madison Randall, female    DOB: [birth year], 38 y.o.   MRN: [id number]  HPI  Patient is a 38 y.o. [id number] female who presents for evaluation for possible perimenopausal symptoms. She notes that she has insomnia (can fall asleep but wakes up around 2 AM and stays awake for 2-4 hours), hot flashes, headaches (onset ~ 48 hrs prior to cycle or right at onset of cycle), PMS-severe cramping, numbness and tingling in her hands and feet, cold chills followed by hot flushes, weight gain, acne outbreaks, and unstable emotions, and many more symptoms. Now having cycles sometimes 2 weeks apart.   Also concerned about weight, has gained ~ 12 lbs.  Very upset about this as she has worked very hard to lose weight.   Lastly notes feet and hand swelling that went on for days last week .  Also was beinning noting neck pain. Had knee and hip achiness.  Tuesday started to improve. Wednesday was better.  This week began having facial redness that lasted for few days. Saw urgent care who recommended referral to Rheumatology.   The following portions of the patient's history were reviewed and updated as appropriate: allergies, current medications, past family history, past medical history, past social history, past surgical history, and problem list.  Review of Systems Pertinent items noted in HPI and remainder of comprehensive ROS otherwise negative.   Objective:   Blood pressure 116/71, pulse 76, height 5\' 3"  (1.6 m), weight 152 lb (68.9 kg), not currently breastfeeding.  Body mass index is 26.93 kg/m. General appearance: alert and no distress Remainder of exam deferred.    Labs:  Reviewed in Care Everywhere  Assessment:   1. Perimenopausal vasomotor symptoms   2. Malar rash      Plan:   1. Perimenopausal vasomotor symptoms (Primary) - Based on age, perimenopause not likely, as patient without family history of ovarian insufficiency and early  menopause, however many of her symptoms do sound hormonally related.  Will check hormone levels (although these must be interpreted in relation to menstrual cycle). Notes next cycle is due in ~ 1 week.    - Discussed possible management options based on labs, including natural remedies and hormonal supplementation with progesterone. Patient would like to try natural supplements.  Given list of ptions.  - FSH/LH - Estradiol - Progesterone  2. Malar rash - Patient noted pictures of her face with spontaneous marked hyperemia of her cheeks ~ 1 week ago with warmth radiating, lasted for 3-4 days.  Rash appears malar. Had few labs (CRP and ESR) performed.  - TSH - Antinuclear Antib (ANA) - Rheumatoid Factor - Antiphospholipid Syndrome Comp   A total of 28 minutes were spent during this encounter, including review of previous progress notes, recent imaging and labs, face-to-face with time with patient involving counseling and coordination of care, as well as documentation for current visit.   Hildred Laser, MD [facility]

## 2023-11-02 NOTE — Patient Instructions (Addendum)
 Perimenopause: What to Know Perimenopause is the time in your life when your levels of estrogen start to go down. Estrogen is the female hormone made by your ovaries. Perimenopause can start 2-8 years before menopause. It can cause changes to your menstrual period. During this time, your ovaries may or may not make an egg. In many cases, you can still get pregnant. What are the causes? Perimenopause is a natural change in your homone levels that happens as you get older. What increases the risk? You're more likely to start perimenopause Madison Randall if: You have an abnormal growth (tumor) of the pituitary gland in your brain. You have a disease that affects your ovaries. You've had certain treatments for cancer. These include: Chemotherapy. Hormone therapy. Radiation therapy on the area between your hips (pelvis). You smoke a lot or drink a lot of alcohol. Other family members have gone through menopause Madison Randall. What are the signs or symptoms? Symptoms are unique to each person. You may have: Hot flashes. Irregular periods. Night sweats. Changes in how you feel about sex. You may have less of a sex drive or feel more discomfort around your sexuality. Vaginal dryness. Headaches. Mood swings. Other symptoms may include: Depression. This is when you feel sad or hopeless. Trouble sleeping. Memory problems or trouble focusing. Irritability. This means getting annoyed easily. Tiredness. Weight gain. Anxiety. This is feeling worried or nervous. You can also have trouble getting pregnant. How is this diagnosed? You may be diagnosed based on: Your medical history. An exam. Your age. Your history of menstrual periods. Your symptoms. Hormone tests. How is this treated? In some cases, no treatment is needed. Talk with your health care provider about if you should get treated. Treatments may include: Menopausal hormone therapy (MHT). Medicines to treat certain  symptoms. Acupuncture. Vitamin or herbal supplements. Before you start treatment, let your provider know if you or anyone in your family has or has had: Heart disease. Breast cancer. Blood clots. Diabetes. Osteoporosis. Follow these instructions at home: Eating and drinking  Eat a balanced diet. It should include: Fresh fruits and vegetables. Whole grains. Soybeans. Eggs. Lean meat. Low-fat dairy. To help prevent hot flashes, stay away from: Alcohol. Drinks with caffeine in them. Spicy foods. Lifestyle Do not smoke, vape, or use nicotine or tobacco. Get at least 30 minutes of physical activity on 5 or more days each week. Get 7-8 hours of sleep each night. Dress in layers that can be taken off if you have a hot flash. Find ways to manage stress. You may want to try: Deep breathing. Meditation. Writing in a journal. General instructions  Take your medicines only as told. Keep track of your periods. Track: When they happen. How heavy they are. How long they last. How much time passes between periods. Keep track of your symptoms. Track: When they start. How often you have them. How long they last. Use vaginal lubricants or moisturizers. These can help with: Vaginal dryness. Comfort during sex. You can still get pregnant if you're having any periods. Make sure you use birth control if you don't want to get pregnant. Contact a health care provider if: You have a very heavy period or pass blood clots. Your period lasts more than 2 days longer than normal. Your period comes back sooner than 21 days. You bleed after having sex. You have pain during sex. You have pain when you pee. You get very bad headaches. You have trouble with your eyesight. Get help right away if: You  have chest pain. You have trouble breathing. You have trouble talking. You have very bad depression. This information is not intended to replace advice given to you by your health care provider.  Make sure you discuss any questions you have with your health care provider. Document Revised: 04/13/2023 Document Reviewed: 04/13/2023 Elsevier Patient Education  2024 [employer].

## 2023-11-09 ENCOUNTER — Encounter: Payer: Self-pay | Admitting: Obstetrics and Gynecology

## 2023-11-10 ENCOUNTER — Encounter: Payer: Self-pay | Admitting: Obstetrics and Gynecology

## 2023-11-20 LAB — ANTIPHOSPHOLIPID SYNDROME COMP
APTT: 25.3 s
Anticardiolipin Ab, IgA: <10 [APL'U]
Anticardiolipin Ab, IgG: 14 [GPL'U]
Anticardiolipin Ab, IgM: 37 [MPL'U] — ABNORMAL HIGH
Antiphosphatidylserine IgG: 5 {GPS'U}
Antiphosphatidylserine IgM: 18 {MPS'U}
Antiprothrombin Antibody, IgG: 8 G units
Beta-2 Glycoprotein I, IgA: <10 SAU
Beta-2 Glycoprotein I, IgG: <10 SGU
Beta-2 Glycoprotein I, IgM: <10 SMU
DRVVT Screen Seconds: 29.1 s
Hexagonal Phospholipid Neutral: 4 s
Platelet Neutralization: 1 s

## 2023-11-20 LAB — FSH/LH
FSH: 5.5 m[IU]/mL
LH: 5.4 m[IU]/mL

## 2023-11-20 LAB — TSH: TSH: 0.607 u[IU]/mL (ref 0.450–4.500)

## 2023-11-20 LAB — ANA: Anti Nuclear Antibody (ANA): NEGATIVE

## 2023-11-20 LAB — RHEUMATOID FACTOR: Rheumatoid fact SerPl-aCnc: <10 [IU]/mL (ref <14.0)

## 2023-11-20 LAB — PROGESTERONE: Progesterone: 21.1 ng/mL

## 2023-11-20 LAB — ESTRADIOL: Estradiol: 146 pg/mL

## 2023-11-27 ENCOUNTER — Encounter: Payer: Self-pay | Admitting: Obstetrics and Gynecology

## 2023-12-05 ENCOUNTER — Other Ambulatory Visit: Payer: Self-pay | Admitting: Obstetrics and Gynecology

## 2023-12-05 MED ORDER — PHENTERMINE HCL 37.5 MG PO CAPS: 37.5000 mg | ORAL_CAPSULE | ORAL | 1 refills | Status: AC
